# Patient Record
Sex: Female | Born: 1960
Health system: Southern US, Community
[De-identification: ages and names within clinical notes are randomized; demographics above are authoritative.]

## PROBLEM LIST (undated history)

## (undated) DIAGNOSIS — N200 Calculus of kidney: Secondary | ICD-10-CM

## (undated) DIAGNOSIS — H269 Unspecified cataract: Secondary | ICD-10-CM

## (undated) DIAGNOSIS — I4891 Unspecified atrial fibrillation: Secondary | ICD-10-CM

## (undated) DIAGNOSIS — E119 Type 2 diabetes mellitus without complications: Secondary | ICD-10-CM

## (undated) DIAGNOSIS — E785 Hyperlipidemia, unspecified: Secondary | ICD-10-CM

## (undated) DIAGNOSIS — G4730 Sleep apnea, unspecified: Secondary | ICD-10-CM

## (undated) DIAGNOSIS — Q638 Other specified congenital malformations of kidney: Secondary | ICD-10-CM

## (undated) DIAGNOSIS — R7303 Prediabetes: Secondary | ICD-10-CM

## (undated) DIAGNOSIS — Z9289 Personal history of other medical treatment: Secondary | ICD-10-CM

## (undated) DIAGNOSIS — Z8739 Personal history of other diseases of the musculoskeletal system and connective tissue: Secondary | ICD-10-CM

## (undated) DIAGNOSIS — F419 Anxiety disorder, unspecified: Secondary | ICD-10-CM

## (undated) DIAGNOSIS — K219 Gastro-esophageal reflux disease without esophagitis: Secondary | ICD-10-CM

## (undated) DIAGNOSIS — Z8619 Personal history of other infectious and parasitic diseases: Secondary | ICD-10-CM

## (undated) DIAGNOSIS — M8580 Other specified disorders of bone density and structure, unspecified site: Secondary | ICD-10-CM

## (undated) DIAGNOSIS — T7840XA Allergy, unspecified, initial encounter: Secondary | ICD-10-CM

## (undated) DIAGNOSIS — R251 Tremor, unspecified: Secondary | ICD-10-CM

## (undated) DIAGNOSIS — J45909 Unspecified asthma, uncomplicated: Secondary | ICD-10-CM

## (undated) DIAGNOSIS — R002 Palpitations: Secondary | ICD-10-CM

## (undated) DIAGNOSIS — R112 Nausea with vomiting, unspecified: Secondary | ICD-10-CM

## (undated) DIAGNOSIS — Z87442 Personal history of urinary calculi: Secondary | ICD-10-CM

## (undated) DIAGNOSIS — N809 Endometriosis, unspecified: Secondary | ICD-10-CM

## (undated) DIAGNOSIS — I499 Cardiac arrhythmia, unspecified: Secondary | ICD-10-CM

## (undated) HISTORY — DX: Anxiety disorder, unspecified: F41.9

## (undated) HISTORY — DX: Gastro-esophageal reflux disease without esophagitis: K21.9

## (undated) HISTORY — PX: NOSE SURGERY: SHX723

## (undated) HISTORY — DX: Unspecified cataract: H26.9

## (undated) HISTORY — DX: Personal history of other diseases of the musculoskeletal system and connective tissue: Z87.39

## (undated) HISTORY — DX: Hyperlipidemia, unspecified: E78.5

## (undated) HISTORY — DX: Personal history of other infectious and parasitic diseases: Z86.19

## (undated) HISTORY — DX: Endometriosis, unspecified: N80.9

## (undated) HISTORY — PX: TONSILLECTOMY: SUR1361

## (undated) HISTORY — PX: LITHOTRIPSY: SUR834

## (undated) HISTORY — PX: LAPAROSCOPIC SALPINGO OOPHERECTOMY: SHX5927

## (undated) HISTORY — DX: Unspecified atrial fibrillation: I48.91

## (undated) HISTORY — DX: Other specified congenital malformations of kidney: Q63.8

## (undated) HISTORY — PX: ABDOMINAL HYSTERECTOMY: SHX81

## (undated) HISTORY — PX: CARPAL TUNNEL RELEASE: SHX101

## (undated) HISTORY — DX: Unspecified asthma, uncomplicated: J45.909

## (undated) HISTORY — DX: Personal history of other medical treatment: Z92.89

## (undated) HISTORY — DX: Tremor, unspecified: R25.1

## (undated) HISTORY — DX: Sleep apnea, unspecified: G47.30

## (undated) HISTORY — DX: Calculus of kidney: N20.0

## (undated) HISTORY — DX: Palpitations: R00.2

## (undated) HISTORY — DX: Prediabetes: R73.03

## (undated) HISTORY — DX: Other specified disorders of bone density and structure, unspecified site: M85.80

## (undated) HISTORY — PX: OTHER SURGICAL HISTORY: SHX169

## (undated) HISTORY — DX: Allergy, unspecified, initial encounter: T78.40XA

## (undated) HISTORY — PX: ABDOMINAL SURGERY: SHX537

---

## 1994-11-10 DIAGNOSIS — N809 Endometriosis, unspecified: Secondary | ICD-10-CM

## 1994-11-10 HISTORY — DX: Endometriosis, unspecified: N80.9

## 2006-12-24 ENCOUNTER — Encounter: Admission: RE | Admit: 2006-12-24 | Discharge: 2006-12-24 | Payer: Self-pay | Admitting: Obstetrics and Gynecology

## 2009-02-14 ENCOUNTER — Encounter: Payer: Self-pay | Admitting: Cardiology

## 2009-02-14 ENCOUNTER — Ambulatory Visit: Payer: Self-pay | Admitting: Cardiology

## 2009-02-14 DIAGNOSIS — E7800 Pure hypercholesterolemia, unspecified: Secondary | ICD-10-CM | POA: Insufficient documentation

## 2009-02-14 DIAGNOSIS — R072 Precordial pain: Secondary | ICD-10-CM | POA: Insufficient documentation

## 2009-02-14 DIAGNOSIS — R002 Palpitations: Secondary | ICD-10-CM | POA: Insufficient documentation

## 2009-02-22 ENCOUNTER — Ambulatory Visit: Payer: Self-pay | Admitting: Pulmonary Disease

## 2009-02-22 DIAGNOSIS — N200 Calculus of kidney: Secondary | ICD-10-CM

## 2009-02-22 DIAGNOSIS — E785 Hyperlipidemia, unspecified: Secondary | ICD-10-CM | POA: Insufficient documentation

## 2009-02-22 DIAGNOSIS — R059 Cough, unspecified: Secondary | ICD-10-CM | POA: Insufficient documentation

## 2009-02-22 DIAGNOSIS — Q638 Other specified congenital malformations of kidney: Secondary | ICD-10-CM | POA: Insufficient documentation

## 2009-02-22 DIAGNOSIS — R0602 Shortness of breath: Secondary | ICD-10-CM | POA: Insufficient documentation

## 2009-02-22 DIAGNOSIS — R05 Cough: Secondary | ICD-10-CM

## 2009-02-22 DIAGNOSIS — K219 Gastro-esophageal reflux disease without esophagitis: Secondary | ICD-10-CM | POA: Insufficient documentation

## 2009-02-22 HISTORY — DX: Calculus of kidney: N20.0

## 2009-02-28 ENCOUNTER — Ambulatory Visit: Payer: Self-pay

## 2009-02-28 ENCOUNTER — Encounter: Payer: Self-pay | Admitting: Cardiology

## 2009-02-28 ENCOUNTER — Ambulatory Visit: Payer: Self-pay | Admitting: Cardiology

## 2009-03-21 ENCOUNTER — Ambulatory Visit: Payer: Self-pay | Admitting: Cardiology

## 2009-03-21 ENCOUNTER — Encounter: Payer: Self-pay | Admitting: Cardiology

## 2009-03-21 DIAGNOSIS — R079 Chest pain, unspecified: Secondary | ICD-10-CM | POA: Insufficient documentation

## 2009-12-19 ENCOUNTER — Ambulatory Visit: Payer: Self-pay | Admitting: Diagnostic Radiology

## 2009-12-19 ENCOUNTER — Ambulatory Visit (HOSPITAL_BASED_OUTPATIENT_CLINIC_OR_DEPARTMENT_OTHER): Admission: RE | Admit: 2009-12-19 | Discharge: 2009-12-19 | Payer: Self-pay | Admitting: Orthopedic Surgery

## 2010-04-15 ENCOUNTER — Telehealth: Payer: Self-pay | Admitting: Cardiology

## 2010-12-10 NOTE — Progress Notes (Signed)
Summary: pressure in chest  Phone Note Call from Patient Call back at Home Phone [phone] Call back at [phone]   Caller: Patient Reason for Call: Talk to Nurse Summary of Call: c/o pressure in chest b/p 88/57 taken on sat. no meds was taken. Initial call taken by: Lorne Skeens,  April 15, 2010 11:19 AM  Follow-up for Phone Call        I spoke with Meredith Finley who is not having chest discomfort at this time.  She did check her blood pressure this weekend while out at a store and did get a reading of 88/57.  She does not have a bp machine at home.She said she was tired and has had "pressure" on and off for awhile but did not go to the ED.  She also states that she has not taken the Prevacid in a "long time" b/c it was expensive.  She did say it may have helped some and would start back on the Prevacid.  She will also go to the ED if she has chest pressure again feeling like it may be her heart as well as make a follow-up appt. with Dr. Jens Som in [place]. Lisabeth Devoid RN

## 2011-06-23 ENCOUNTER — Encounter: Payer: Self-pay | Admitting: Cardiology

## 2012-09-01 ENCOUNTER — Emergency Department (HOSPITAL_BASED_OUTPATIENT_CLINIC_OR_DEPARTMENT_OTHER): Payer: 59

## 2012-09-01 ENCOUNTER — Emergency Department (HOSPITAL_BASED_OUTPATIENT_CLINIC_OR_DEPARTMENT_OTHER)
Admission: EM | Admit: 2012-09-01 | Discharge: 2012-09-02 | Disposition: A | Discharge status: Home / Self Care | Payer: 59 | Attending: Emergency Medicine | Admitting: Emergency Medicine

## 2012-09-01 ENCOUNTER — Encounter (HOSPITAL_BASED_OUTPATIENT_CLINIC_OR_DEPARTMENT_OTHER): Payer: Self-pay

## 2012-09-01 DIAGNOSIS — R002 Palpitations: Secondary | ICD-10-CM | POA: Insufficient documentation

## 2012-09-01 DIAGNOSIS — R42 Dizziness and giddiness: Secondary | ICD-10-CM | POA: Insufficient documentation

## 2012-09-01 DIAGNOSIS — R0609 Other forms of dyspnea: Secondary | ICD-10-CM | POA: Insufficient documentation

## 2012-09-01 DIAGNOSIS — Q649 Congenital malformation of urinary system, unspecified: Secondary | ICD-10-CM | POA: Insufficient documentation

## 2012-09-01 DIAGNOSIS — R109 Unspecified abdominal pain: Secondary | ICD-10-CM

## 2012-09-01 DIAGNOSIS — R0989 Other specified symptoms and signs involving the circulatory and respiratory systems: Secondary | ICD-10-CM | POA: Insufficient documentation

## 2012-09-01 DIAGNOSIS — R209 Unspecified disturbances of skin sensation: Secondary | ICD-10-CM | POA: Insufficient documentation

## 2012-09-01 DIAGNOSIS — R072 Precordial pain: Secondary | ICD-10-CM | POA: Insufficient documentation

## 2012-09-01 DIAGNOSIS — E119 Type 2 diabetes mellitus without complications: Secondary | ICD-10-CM | POA: Insufficient documentation

## 2012-09-01 DIAGNOSIS — E785 Hyperlipidemia, unspecified: Secondary | ICD-10-CM | POA: Insufficient documentation

## 2012-09-01 DIAGNOSIS — K219 Gastro-esophageal reflux disease without esophagitis: Secondary | ICD-10-CM | POA: Insufficient documentation

## 2012-09-01 DIAGNOSIS — R0789 Other chest pain: Secondary | ICD-10-CM | POA: Insufficient documentation

## 2012-09-01 DIAGNOSIS — R059 Cough, unspecified: Secondary | ICD-10-CM | POA: Insufficient documentation

## 2012-09-01 DIAGNOSIS — R05 Cough: Secondary | ICD-10-CM | POA: Insufficient documentation

## 2012-09-01 DIAGNOSIS — R197 Diarrhea, unspecified: Secondary | ICD-10-CM | POA: Insufficient documentation

## 2012-09-01 DIAGNOSIS — E7800 Pure hypercholesterolemia, unspecified: Secondary | ICD-10-CM | POA: Insufficient documentation

## 2012-09-01 DIAGNOSIS — R1032 Left lower quadrant pain: Secondary | ICD-10-CM | POA: Insufficient documentation

## 2012-09-01 DIAGNOSIS — Z79899 Other long term (current) drug therapy: Secondary | ICD-10-CM | POA: Insufficient documentation

## 2012-09-01 DIAGNOSIS — R110 Nausea: Secondary | ICD-10-CM | POA: Insufficient documentation

## 2012-09-01 DIAGNOSIS — N200 Calculus of kidney: Secondary | ICD-10-CM | POA: Insufficient documentation

## 2012-09-01 HISTORY — DX: Type 2 diabetes mellitus without complications: E11.9

## 2012-09-01 LAB — CBC WITH DIFFERENTIAL/PLATELET
Basophils Absolute: 0 10*3/uL (ref 0.0–0.1)
Basophils Relative: 0 % (ref 0–1)
Lymphs Abs: 2.1 10*3/uL (ref 0.7–4.0)
Monocytes Absolute: 0.4 10*3/uL (ref 0.1–1.0)
Neutro Abs: 11.9 10*3/uL — ABNORMAL HIGH (ref 1.7–7.7)
Neutrophils Relative %: 82 % — ABNORMAL HIGH (ref 43–77)
WBC: 14.5 10*3/uL — ABNORMAL HIGH (ref 4.0–10.5)

## 2012-09-01 LAB — COMPREHENSIVE METABOLIC PANEL
AST: 20 U/L (ref 0–37)
Albumin: 3.7 g/dL (ref 3.5–5.2)
Alkaline Phosphatase: 112 U/L (ref 39–117)
CO2: 25 mEq/L (ref 19–32)
GFR calc Af Amer: >90 mL/min (ref >90)
GFR calc non Af Amer: >90 mL/min (ref >90)
Glucose, Bld: 131 mg/dL — ABNORMAL HIGH (ref 70–99)
Total Bilirubin: 0.3 mg/dL (ref 0.3–1.2)

## 2012-09-01 LAB — URINALYSIS, ROUTINE W REFLEX MICROSCOPIC
Hgb urine dipstick: NEGATIVE
Nitrite: NEGATIVE
pH: 6.5 (ref 5.0–8.0)

## 2012-09-01 MED ORDER — ONDANSETRON HCL 4 MG/2ML IJ SOLN
4.0000 mg | Freq: Once | INTRAMUSCULAR | Status: AC
  Administered 2012-09-01: 4 mg via INTRAVENOUS
  Filled 2012-09-01: qty 2

## 2012-09-01 MED ORDER — SODIUM CHLORIDE 0.9 % IV BOLUS (SEPSIS)
1000.0000 mL | Freq: Once | INTRAVENOUS | Status: AC
  Administered 2012-09-01: 1000 mL via INTRAVENOUS

## 2012-09-01 NOTE — ED Notes (Addendum)
Pt later c/o "chest pressure" when asked to rate pain-pt refused BR offer/ need to void at present

## 2012-09-01 NOTE — ED Notes (Signed)
C/o abd pain, nausea approx 1 hour ago after eating-episode of hyperventilating after pain started-NAD at present-c/o cont'd numbness and tingling, weakness

## 2012-09-01 NOTE — ED Notes (Signed)
Patient transported to CT 

## 2012-09-01 NOTE — ED Provider Notes (Signed)
History     CSN: [id number]  Arrival date & time 09/01/12  2125   First MD Initiated Contact with Patient 09/01/12 2149      Chief Complaint  Patient presents with  . Abdominal Pain    (Consider location/radiation/quality/duration/timing/severity/associated sxs/prior treatment) HPI The patient presents with abdominal pain, nausea, diarrhea.  She notes that her symptoms began in the hours prior to arrival.  Symptoms began after eating dinner.  She notes the pain was diffuse, focally in her left lower quadrant.  The pain is sharp.  She notes associated nausea, with diarrhea immediately after the onset of pain.  After the nausea and pain began she felt diffuse discomfort, lightheadedness, dysesthesia in all extremities.  She notes that since this occurred she has gradually begun to feel better without specific interventions. She is generally healthy, though she notes over the past weeks she has had several episodes of intermittent diarrhea and abdominal pain. Past Medical History  Diagnosis Date  . Nephrolithiasis   . Palpitations   . HLD (hyperlipidemia)   . GERD (gastroesophageal reflux disease)   . Cough   . Dyspnea   . Other specified congenital anomaly of kidney   . Precordial pain   . Hypercholesterolemia     pure  . Diabetes mellitus without complication     Past Surgical History  Procedure Date  . Hysterecomt (other)   . Abdominal surgery     for congenital kidney abnormality  . Nose surgery   . Cesarean section   . Abdominal hysterectomy     No family history on file.  History  Substance Use Topics  . Smoking status: Never Smoker   . Smokeless tobacco: Not on file  . Alcohol Use: No    OB History    Grav Para Term Preterm Abortions TAB SAB Ect Mult Living                  Review of Systems  Constitutional:       Per HPI, otherwise negative  HENT:       Per HPI, otherwise negative  Eyes: Negative.   Respiratory:       Per HPI, otherwise negative    Cardiovascular:       Per HPI, otherwise negative  Gastrointestinal: Positive for nausea and diarrhea. Negative for vomiting.  Genitourinary: Negative.   Musculoskeletal:       Per HPI, otherwise negative  Skin: Negative.   Neurological: Positive for light-headedness and numbness. Negative for dizziness, tremors, syncope, facial asymmetry, speech difficulty, weakness and headaches.    Allergies  Niacin  Home Medications   Current Outpatient Rx  Name Route Sig Dispense Refill  . METFORMIN HCL PO Oral Take by mouth.    . CETIRIZINE HCL 10 MG PO TABS Oral Take 10 mg by mouth as needed.      Marland Kitchen ESTRADIOL 1 MG PO TABS Oral Take 1 mg by mouth daily.      Marland Kitchen ESTRADIOL 1 MG PO TABS Oral Take 1 mg by mouth daily.      Marland Kitchen LANSOPRAZOLE 15 MG PO CPDR Oral Take 15 mg by mouth daily.        BP 128/77  Pulse 92  Temp 97.9 F (36.6 C) (Oral)  Resp 18  Ht 5\' 3"  (1.6 m)  Wt 180 lb (81.647 kg)  BMI 31.89 kg/m2  SpO2 98%  Physical Exam  Nursing note and vitals reviewed. Constitutional: She is oriented to person, place, and time. She  appears well-developed and well-nourished. No distress.  HENT:  Head: Normocephalic and atraumatic.  Eyes: Conjunctivae normal and EOM are normal.  Cardiovascular: Normal rate and regular rhythm.   Pulmonary/Chest: Effort normal and breath sounds normal. No stridor. No respiratory distress.  Abdominal: Soft. Normal appearance. She exhibits no distension. There is tenderness in the left lower quadrant. There is no rigidity, no rebound and no guarding.  Musculoskeletal: She exhibits no edema.  Neurological: She is alert and oriented to person, place, and time. No cranial nerve deficit.  Skin: Skin is warm and dry.  Psychiatric: She has a normal mood and affect.    ED Course  Procedures (including critical care time)   Labs Reviewed  COMPREHENSIVE METABOLIC PANEL  CBC WITH DIFFERENTIAL  LIPASE, BLOOD  URINALYSIS, ROUTINE W REFLEX MICROSCOPIC   No  results found.   No diagnosis found.  12:27 AM Patient substantially better.  All results d/w her and her husband.  I showed them the CT images.  MDM  This patient presents with relatively sudden onset of nausea, diarrhea, generalized discomfort.  Given the patient's localization of intramuscular quadrant, her endorsement of recent intermittent diarrhea, or some suspicion of diverticulitis versus colitis.  On exam the patient is initially uncomfortable, though she improved substantially while in the emergency department.  The patient's labs, CAT scan were largely reassuring, with a little suspicion for acute ongoing pathology.  The patient was counseled on the need for ongoing evaluation, but absent ongoing complaints, with stable vital signs, labs are reassuring so for mild leukocytosis, and with resolution of her pain, she is discharged in stable condition.    Gerhard Munch, MD 09/03/12 [phone]

## 2012-09-01 NOTE — ED Notes (Signed)
Pt ambulatory to bathroom with assistance.

## 2012-09-01 NOTE — ED Notes (Signed)
Pt drinking po contrast for CT and instructed to use call bell when able to void. Husband to desk stating pt is allergic to IV dye. CT called and made aware. Allergy list updated.

## 2012-09-02 ENCOUNTER — Telehealth: Payer: Self-pay | Admitting: Gastroenterology

## 2012-09-02 NOTE — Telephone Encounter (Signed)
Pt reports she was seen at [facility] on 09/01/12 for LLQ pain and rectal bleeding. The bleeding is what scares her and she states it's mainly when she wipes; BRB on tissue and a pinkish mucus like discharge. She has not been seen by a GI doc in this area. Informed her I will cancel the appt with Dr Jarold Motto, she will see Gunnar Fusi tomorrow and become Dr Lauro Franklin pt. Pt given directions and stated understanding.

## 2012-09-02 NOTE — ED Notes (Signed)
MD at bedside. 

## 2012-09-03 ENCOUNTER — Ambulatory Visit: Payer: 59 | Admitting: Nurse Practitioner

## 2012-09-03 ENCOUNTER — Encounter: Payer: Self-pay | Admitting: Gastroenterology

## 2012-09-20 ENCOUNTER — Encounter: Payer: Self-pay | Admitting: *Deleted

## 2012-09-23 ENCOUNTER — Ambulatory Visit: Payer: 59 | Admitting: Gastroenterology

## 2012-09-24 ENCOUNTER — Ambulatory Visit: Payer: 59 | Admitting: Gastroenterology

## 2012-12-25 ENCOUNTER — Other Ambulatory Visit: Payer: Self-pay

## 2013-02-15 ENCOUNTER — Encounter: Payer: Self-pay | Admitting: Internal Medicine

## 2013-02-15 ENCOUNTER — Ambulatory Visit (INDEPENDENT_AMBULATORY_CARE_PROVIDER_SITE_OTHER): Payer: 59 | Admitting: Internal Medicine

## 2013-02-15 VITALS — BP 96/68 | HR 67 | Temp 97.7°F | Ht 64.0 in | Wt 182.0 lb

## 2013-02-15 DIAGNOSIS — Z1211 Encounter for screening for malignant neoplasm of colon: Secondary | ICD-10-CM

## 2013-02-15 DIAGNOSIS — K219 Gastro-esophageal reflux disease without esophagitis: Secondary | ICD-10-CM

## 2013-02-15 DIAGNOSIS — Z90710 Acquired absence of both cervix and uterus: Secondary | ICD-10-CM

## 2013-02-15 DIAGNOSIS — G4730 Sleep apnea, unspecified: Secondary | ICD-10-CM

## 2013-02-15 DIAGNOSIS — G478 Other sleep disorders: Secondary | ICD-10-CM

## 2013-02-15 DIAGNOSIS — E785 Hyperlipidemia, unspecified: Secondary | ICD-10-CM

## 2013-02-15 DIAGNOSIS — Z9289 Personal history of other medical treatment: Secondary | ICD-10-CM

## 2013-02-15 DIAGNOSIS — Z87898 Personal history of other specified conditions: Secondary | ICD-10-CM

## 2013-02-15 DIAGNOSIS — E119 Type 2 diabetes mellitus without complications: Secondary | ICD-10-CM

## 2013-02-15 DIAGNOSIS — N200 Calculus of kidney: Secondary | ICD-10-CM

## 2013-02-15 DIAGNOSIS — Z9189 Other specified personal risk factors, not elsewhere classified: Secondary | ICD-10-CM

## 2013-02-15 MED ORDER — METFORMIN HCL ER 500 MG PO TB24: 500.0000 mg | ORAL_TABLET | Freq: Every day | ORAL | Status: DC

## 2013-02-15 NOTE — Patient Instructions (Addendum)
Get back on metformin   Will do a refill. Plan labs in 3 months.    Will plan  Sleep referral and colonoscopy referral. As we discussed and I will review your records.

## 2013-02-15 NOTE — Progress Notes (Signed)
Chief Complaint  Patient presents with  . Establish Care    Unsure when she had a td injection.  Has not had a colonoscopy or flu vaccine.    HPI: Patient comes in as new patient visit . Previous care was via her specialist and no PCP . Gest dm and now early DM:  Noted mildly elevated  adn  A1c.    With the PCOS. And gestational diabetes   So on metformin for 1+ years  hasnt taken recently   Since December.  2013. Sees Dr Talmage Nap  Bp; has been good   Renal stone  Hx; of abnormality  And procedure  Ureteroscopic surgical repair for kidney defect in 1997 1997   Calcium stone  gerd controlled ls intervention  Neuro :  Tremors   And grasping problem and memory. Issues .  MRI   No acute issues  Following as needed    With Dr Jani Files up with suffocating feeling and snoring    Endometriosis PCOs compete hysterectomy on hrt   osteopenia  Husband   Has sleep apnea and concerned  She could also have this   No fam hx .  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   g2 p2  c section  ROS: See pertinent positives and negatives per HPI. Neg cp sob syncope gi bleeding 12 system review  Past Medical History  Diagnosis Date  . Nephrolithiasis   . Palpitations   . HLD (hyperlipidemia)   . GERD (gastroesophageal reflux disease)   . Cough   . Dyspnea   . Other specified congenital anomaly of kidney   . Precordial pain     cardiac evaluation  . Hypercholesterolemia     pure  . Diabetes mellitus without complication   . NEPHROLITHIASIS 02/22/2009    Qualifier: Diagnosis of  By: Excell Seltzer CMA, Lawson Fiscal    . Transfusion history     placental abruption 1986  . Hx of varicella   . H/O osteopenia   . Hx of abdominal pain     10 13 neg ct but concern about colitis other    Family History  Problem Relation Age of Onset  . Cancer Mother     Colon cancer, breast cancer and lung cancer  . Tremor Mother   .  Arthritis Father   . Vision loss Father     Macular degeneration  . Scoliosis Father   . Asthma Sister   . Asthma Brother   . Hypertension Maternal Grandmother   . Osteoporosis Maternal Grandmother   . Other Sister     twin   NAFLD    History   Social History  . Marital Status: Married    Spouse Name: N/A    Number of Children: N/A  . Years of Education: N/A   Social History Main Topics  . Smoking status: Never Smoker   . Smokeless tobacco: None  . Alcohol Use: No  . Drug Use: None  . Sexually Active: Yes    Birth Control/ Protection: Surgical   Other Topics Concern  . None   Social History Narrative   Usually gets 9 hours of sleep per night   4 people living in the home.   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 estradiol (ESTRACE) 1 MG tablet Take 1 mg by mouth daily.       . [DISCONTINUED] METFORMIN HCL PO Take by mouth.      . metFORMIN (GLUCOPHAGE-XR) 500 MG 24 hr tablet Take 1 tablet (500 mg total) by mouth daily with breakfast.  90 tablet  3  . [DISCONTINUED] cetirizine (ZYRTEC ALLERGY) 10 MG tablet Take 10 mg by mouth as needed.        . [DISCONTINUED] estradiol (ESTRACE) 1 MG tablet Take 1 mg by mouth daily.        . [DISCONTINUED] lansoprazole (PREVACID) 15 MG capsule Take 15 mg by mouth daily.         No facility-administered encounter medications on file as of 02/15/2013.    EXAM:  BP 96/68  Pulse 67  Temp(Src) 97.7 F (36.5 C) (Oral)  Ht 5\' 4"  (1.626 m)  Wt 182 lb (82.555 kg)  BMI 31.22 kg/m2  SpO2 98%  Body mass index is 31.22 kg/(m^2).  GENERAL: vitals reviewed and listed above, alert, oriented, appears well hydrated and in no acute distress  HEENT: atraumatic, conjunctiva  clear, no obvious abnormalities on inspection of external nose and ears OP : no lesion edema or exudate   NECK:  no obvious masses on inspection palpation   LUNGS: clear to auscultation bilaterally, no wheezes, rales or rhonchi, good air movement Abdomen:  Sof,t normal bowel sounds without hepatosplenomegaly, no guarding rebound or masses no CVA tenderness CV: HRRR, no clubbing cyanosis or  peripheral edema nl cap refill  MS: moves all extremities without noticeable focal  abnormality PSYCH: pleasant and cooperative, no obvious depression or anxiety  ASSESSMENT AND PLAN:  Discussed the following assessment and plan:  Diabetes mellitus without complication  Sleep disorder breathing - with snoring poss sleep apnea - Plan: Ambulatory referral to Pulmonology  G E REFLUX  Transfusion history  Hx of abdominal pain  H/O hysterectomy for benign disease - endometriosis surgical menopause  NEPHROLITHIASIS  HYPERLIPIDEMIA  Encounter for screening colonoscopy - Plan: Ambulatory referral to Gastroenterology  -Patient advised to return or notify health care team  if symptoms worsen or persist or new concerns arise.  Patient Instructions  Get back on metformin   Will do a refill. Plan labs in 3 months.    Will plan  Sleep referral and colonoscopy referral. As we discussed and I will review your records.       Neta Mends. Maddi Collar M.D. [facility]   Noted renal stones on ct scan.

## 2013-02-20 ENCOUNTER — Encounter: Payer: Self-pay | Admitting: Internal Medicine

## 2013-02-20 DIAGNOSIS — G4733 Obstructive sleep apnea (adult) (pediatric): Secondary | ICD-10-CM | POA: Insufficient documentation

## 2013-02-20 DIAGNOSIS — Z90710 Acquired absence of both cervix and uterus: Secondary | ICD-10-CM | POA: Insufficient documentation

## 2013-02-20 DIAGNOSIS — Z87898 Personal history of other specified conditions: Secondary | ICD-10-CM | POA: Insufficient documentation

## 2013-02-20 DIAGNOSIS — Z9289 Personal history of other medical treatment: Secondary | ICD-10-CM | POA: Insufficient documentation

## 2013-02-21 ENCOUNTER — Encounter: Payer: Self-pay | Admitting: Gastroenterology

## 2013-02-23 ENCOUNTER — Encounter: Payer: Self-pay | Admitting: Cardiology

## 2013-03-15 ENCOUNTER — Institutional Professional Consult (permissible substitution): Payer: 59 | Admitting: Pulmonary Disease

## 2013-03-25 ENCOUNTER — Telehealth: Payer: Self-pay | Admitting: *Deleted

## 2013-03-25 NOTE — Telephone Encounter (Signed)
Opened in error

## 2013-03-28 ENCOUNTER — Encounter: Payer: Self-pay | Admitting: Gastroenterology

## 2013-03-28 ENCOUNTER — Ambulatory Visit (AMBULATORY_SURGERY_CENTER): Payer: BC Managed Care – PPO | Admitting: *Deleted

## 2013-03-28 VITALS — Ht 63.0 in | Wt 182.2 lb

## 2013-03-28 DIAGNOSIS — Z1211 Encounter for screening for malignant neoplasm of colon: Secondary | ICD-10-CM

## 2013-03-28 DIAGNOSIS — Z800 Family history of malignant neoplasm of digestive organs: Secondary | ICD-10-CM

## 2013-03-28 MED ORDER — MOVIPREP 100 G PO SOLR: 1.0000 | Freq: Once | ORAL | Status: DC

## 2013-03-28 NOTE — Progress Notes (Signed)
Denies complications with anesthesia or sedation. Denies allergies to eggs or soy products. 

## 2013-04-11 ENCOUNTER — Encounter: Payer: Self-pay | Admitting: Gastroenterology

## 2013-04-11 ENCOUNTER — Other Ambulatory Visit: Payer: Self-pay | Admitting: Gastroenterology

## 2013-04-11 ENCOUNTER — Ambulatory Visit (AMBULATORY_SURGERY_CENTER): Payer: BC Managed Care – PPO | Admitting: Gastroenterology

## 2013-04-11 VITALS — BP 94/66 | HR 61 | Temp 97.7°F | Resp 0 | Ht 63.0 in | Wt 182.0 lb

## 2013-04-11 DIAGNOSIS — D126 Benign neoplasm of colon, unspecified: Secondary | ICD-10-CM

## 2013-04-11 DIAGNOSIS — Z1211 Encounter for screening for malignant neoplasm of colon: Secondary | ICD-10-CM

## 2013-04-11 DIAGNOSIS — Z800 Family history of malignant neoplasm of digestive organs: Secondary | ICD-10-CM

## 2013-04-11 MED ORDER — DEXTROSE 5 % IV SOLN: INTRAVENOUS | Status: DC

## 2013-04-11 MED ORDER — SODIUM CHLORIDE 0.9 % IV SOLN: 500.0000 mL | INTRAVENOUS | Status: DC

## 2013-04-11 NOTE — Patient Instructions (Addendum)
Discharge instructions given with verbal understanding. Handout on polyps given. Resume previous medications. YOU HAD AN ENDOSCOPIC PROCEDURE TODAY AT THE [facility]: Refer to the procedure report that was given to you for any specific questions about what was found during the examination.  If the procedure report does not answer your questions, please call your gastroenterologist to clarify.  If you requested that your care partner not be given the details of your procedure findings, then the procedure report has been included in a sealed envelope for you to review at your convenience later.  YOU SHOULD EXPECT: Some feelings of bloating in the abdomen. Passage of more gas than usual.  Walking can help get rid of the air that was put into your GI tract during the procedure and reduce the bloating. If you had a lower endoscopy (such as a colonoscopy or flexible sigmoidoscopy) you may notice spotting of blood in your stool or on the toilet paper. If you underwent a bowel prep for your procedure, then you may not have a normal bowel movement for a few days.  DIET: Your first meal following the procedure should be a light meal and then it is ok to progress to your normal diet.  A half-sandwich or bowl of soup is an example of a good first meal.  Heavy or fried foods are harder to digest and may make you feel nauseous or bloated.  Likewise meals heavy in dairy and vegetables can cause extra gas to form and this can also increase the bloating.  Drink plenty of fluids but you should avoid alcoholic beverages for 24 hours.  ACTIVITY: Your care partner should take you home directly after the procedure.  You should plan to take it easy, moving slowly for the rest of the day.  You can resume normal activity the day after the procedure however you should NOT DRIVE or use heavy machinery for 24 hours (because of the sedation medicines used during the test).    SYMPTOMS TO REPORT IMMEDIATELY: A  gastroenterologist can be reached at any hour.  During normal business hours, 8:30 AM to 5:00 PM Monday through Friday, call [phone].  After hours and on weekends, please call the GI answering service at [phone] who will take a message and have the physician on call contact you.   Following lower endoscopy (colonoscopy or flexible sigmoidoscopy):  Excessive amounts of blood in the stool  Significant tenderness or worsening of abdominal pains  Swelling of the abdomen that is new, acute  Fever of 100F or higher  FOLLOW UP: If any biopsies were taken you will be contacted by phone or by letter within the next 1-3 weeks.  Call your gastroenterologist if you have not heard about the biopsies in 3 weeks.  Our staff will call the home number listed on your records the next business day following your procedure to check on you and address any questions or concerns that you may have at that time regarding the information given to you following your procedure. This is a courtesy call and so if there is no answer at the home number and we have not heard from you through the emergency physician on call, we will assume that you have returned to your regular daily activities without incident.  SIGNATURES/CONFIDENTIALITY: You and/or your care partner have signed paperwork which will be entered into your electronic medical record.  These signatures attest to the fact that that the information above on your After Visit Summary has   been reviewed and is understood.  Full responsibility of the confidentiality of this discharge information lies with you and/or your care-partner. 

## 2013-04-11 NOTE — Progress Notes (Signed)
Patient did not experience any of the following events: a burn prior to discharge; a fall within the facility; wrong site/side/patient/procedure/implant event; or a hospital transfer or hospital admission upon discharge from the facility. (G8907) Patient did not have preoperative order for IV antibiotic SSI prophylaxis. (G8918)  

## 2013-04-11 NOTE — Op Note (Signed)
[facility] [address] [place], [address]   COLONOSCOPY PROCEDURE REPORT  PATIENT: Meredith, Finley  MR#: [id number] BIRTHDATE: 03-04-1961 , 52  yrs. old GENDER: Female ENDOSCOPIST: Mardella Layman, MD, Clementeen Graham REFERRED BY:  Madelin Headings, M.D. PROCEDURE DATE:  04/11/2013 PROCEDURE:   Colonoscopy, screening and Colonoscopy with biopsy ASA CLASS:   Class II INDICATIONS:Patient's immediate family history of colon cancer and Rectal Bleeding. MEDICATIONS: propofol (Diprivan) 300mg  IV  DESCRIPTION OF PROCEDURE:   After the risks and benefits and of the procedure were explained, informed consent was obtained.  A digital rectal exam revealed no abnormalities of the rectum.    The LB [id number]  endoscope was introduced through the anus and advanced to the cecum, which was identified by both the appendix and ileocecal valve .  The quality of the prep was excellent, using MoviPrep .  The instrument was then slowly withdrawn as the colon was fully examined.     COLON FINDINGS: A smooth flat polyp ranging between 3-35mm in size was found in the sigmoid colon.  Multiple biopsies of the area were performed using cold forceps.   The colon was otherwise normal. There was no diverticulosis, inflammation, polyps or cancers unless previously stated.     Retroflexed views revealed no abnormalities. The scope was then withdrawn from the patient and the procedure completed.  COMPLICATIONS: There were no complications. ENDOSCOPIC IMPRESSION: 1.   Flat polyp ranging between 3-71mm in size was found in the sigmoid colon; multiple biopsies of the area were performed using cold forceps 2.   The colon was otherwise normal ..FH colon Ca in her mother 3.  Hx of probable ischemic colitis previouslt in the fall  RECOMMENDATIONS: 1.  Await pathology results 2.  Given your significant family history of colon cancer, you should have a repeat colonoscopy in 5 years   REPEAT  EXAM:  cc:  _______________________________ eSigned:  Mardella Layman, MD, [facility] 04/11/2013 9:57 AM     PATIENT NAME:  Meredith, Finley MR#: [id number]

## 2013-04-11 NOTE — Progress Notes (Signed)
Called to room to assist during endoscopic procedure.  Patient ID and intended procedure confirmed with present staff. Received instructions for my participation in the procedure from the performing physician.  

## 2013-04-12 ENCOUNTER — Telehealth: Payer: Self-pay

## 2013-04-12 NOTE — Telephone Encounter (Signed)
  Follow up Call-  Call back number 04/11/2013  Post procedure Call Back phone  # [phone]  Permission to leave phone message Yes     Patient questions:  Do you have a fever, pain , or abdominal swelling? no Pain Score  0 *  Have you tolerated food without any problems? yes  Have you been able to return to your normal activities? yes  Do you have any questions about your discharge instructions: Diet   no Medications  no Follow up visit  no  Do you have questions or concerns about your Care? no  Actions: * If pain score is 4 or above: No action needed, pain <4.

## 2013-04-18 ENCOUNTER — Encounter: Payer: Self-pay | Admitting: Gastroenterology

## 2013-05-03 ENCOUNTER — Institutional Professional Consult (permissible substitution): Payer: 59 | Admitting: Internal Medicine

## 2013-05-17 ENCOUNTER — Other Ambulatory Visit (INDEPENDENT_AMBULATORY_CARE_PROVIDER_SITE_OTHER): Payer: BC Managed Care – PPO

## 2013-05-17 DIAGNOSIS — Z0000 Encounter for general adult medical examination without abnormal findings: Secondary | ICD-10-CM

## 2013-05-17 LAB — BASIC METABOLIC PANEL
Chloride: 104 mEq/L (ref 96–112)
Potassium: 3.7 mEq/L (ref 3.5–5.1)
Sodium: 139 mEq/L (ref 135–145)

## 2013-05-17 LAB — CBC WITH DIFFERENTIAL/PLATELET
Basophils Relative: 0.6 % (ref 0.0–3.0)
Eosinophils Relative: 2.4 % (ref 0.0–5.0)
HCT: 40.7 % (ref 36.0–46.0)
Lymphs Abs: 2.8 10*3/uL (ref 0.7–4.0)
MCV: 93.5 fl (ref 78.0–100.0)
Monocytes Absolute: 0.4 10*3/uL (ref 0.1–1.0)
Monocytes Relative: 5.5 % (ref 3.0–12.0)
RBC: 4.35 Mil/uL (ref 3.87–5.11)
WBC: 7.6 10*3/uL (ref 4.5–10.5)

## 2013-05-17 LAB — HEMOGLOBIN A1C: Hgb A1c MFr Bld: 5.7 % (ref 4.6–6.5)

## 2013-05-17 LAB — HEPATIC FUNCTION PANEL
ALT: 18 U/L (ref 0–35)
AST: 21 U/L (ref 0–37)
Bilirubin, Direct: 0.1 mg/dL (ref 0.0–0.3)
Total Protein: 7.6 g/dL (ref 6.0–8.3)

## 2013-05-17 LAB — TSH: TSH: 1.31 u[IU]/mL (ref 0.35–5.50)

## 2013-05-17 LAB — LIPID PANEL: Cholesterol: 224 mg/dL — ABNORMAL HIGH (ref 0–200)

## 2013-05-18 ENCOUNTER — Other Ambulatory Visit: Payer: Self-pay | Admitting: Obstetrics and Gynecology

## 2013-05-18 LAB — HEPATITIS C ANTIBODY: HCV Ab: NEGATIVE

## 2013-05-24 ENCOUNTER — Encounter: Payer: Self-pay | Admitting: Internal Medicine

## 2013-05-24 ENCOUNTER — Ambulatory Visit (INDEPENDENT_AMBULATORY_CARE_PROVIDER_SITE_OTHER): Payer: BC Managed Care – PPO | Admitting: Internal Medicine

## 2013-05-24 VITALS — BP 112/74 | HR 86 | Temp 97.5°F | Ht 63.75 in | Wt 184.0 lb

## 2013-05-24 DIAGNOSIS — E785 Hyperlipidemia, unspecified: Secondary | ICD-10-CM

## 2013-05-24 DIAGNOSIS — M549 Dorsalgia, unspecified: Secondary | ICD-10-CM

## 2013-05-24 DIAGNOSIS — N200 Calculus of kidney: Secondary | ICD-10-CM

## 2013-05-24 DIAGNOSIS — Z0000 Encounter for general adult medical examination without abnormal findings: Secondary | ICD-10-CM

## 2013-05-24 DIAGNOSIS — Q638 Other specified congenital malformations of kidney: Secondary | ICD-10-CM

## 2013-05-24 DIAGNOSIS — E8941 Symptomatic postprocedural ovarian failure: Secondary | ICD-10-CM

## 2013-05-24 DIAGNOSIS — E8940 Asymptomatic postprocedural ovarian failure: Secondary | ICD-10-CM

## 2013-05-24 DIAGNOSIS — K219 Gastro-esophageal reflux disease without esophagitis: Secondary | ICD-10-CM

## 2013-05-24 DIAGNOSIS — Z90710 Acquired absence of both cervix and uterus: Secondary | ICD-10-CM

## 2013-05-24 DIAGNOSIS — R7309 Other abnormal glucose: Secondary | ICD-10-CM

## 2013-05-24 DIAGNOSIS — R7303 Prediabetes: Secondary | ICD-10-CM

## 2013-05-24 MED ORDER — METFORMIN HCL ER 500 MG PO TB24: 500.0000 mg | ORAL_TABLET | Freq: Every day | ORAL | Status: DC

## 2013-05-24 NOTE — Patient Instructions (Signed)
Continue lifestyle intervention healthy eating and exercise . NO CHANGE IN meds. Can get tdap booster at any time.  I agree that there is no evidence of diabetes and will correct the medical record.  Have copy of  dexa sent to [place] . Will do a urology referral for the stones and pain.   We can repeat  Lipid hg a1c in 6 months   Otherwise  Yearly cpx with labs and a1c.   Hypertriglyceridemia  Diet for High blood levels of Triglycerides Most fats in food are triglycerides. Triglycerides in your blood are stored as fat in your body. High levels of triglycerides in your blood may put you at a greater risk for heart disease and stroke.  Normal triglyceride levels are less than 150 mg/dL. Borderline high levels are 150-199 mg/dl. High levels are 200 - 499 mg/dL, and very high triglyceride levels are greater than 500 mg/dL. The decision to treat high triglycerides is generally based on the level. For people with borderline or high triglyceride levels, treatment includes weight loss and exercise. Drugs are recommended for people with very high triglyceride levels. Many people who need treatment for high triglyceride levels have metabolic syndrome. This syndrome is a collection of disorders that often include: insulin resistance, high blood pressure, blood clotting problems, high cholesterol and triglycerides. TESTING PROCEDURE FOR TRIGLYCERIDES  You should not eat 4 hours before getting your triglycerides measured. The normal range of triglycerides is between 10 and 250 milligrams per deciliter (mg/dl). Some people may have extreme levels (1000 or above), but your triglyceride level may be too high if it is above 150 mg/dl, depending on what other risk factors you have for heart disease.  People with high blood triglycerides may also have high blood cholesterol levels. If you have high blood cholesterol as well as high blood triglycerides, your risk for heart disease is probably greater than if you only  had high triglycerides. High blood cholesterol is one of the main risk factors for heart disease. CHANGING YOUR DIET  Your weight can affect your blood triglyceride level. If you are more than 20% above your ideal body weight, you may be able to lower your blood triglycerides by losing weight. Eating less and exercising regularly is the best way to combat this. Fat provides more calories than any other food. The best way to lose weight is to eat less fat. Only 30% of your total calories should come from fat. Less than 7% of your diet should come from saturated fat. A diet low in fat and saturated fat is the same as a diet to decrease blood cholesterol. By eating a diet lower in fat, you may lose weight, lower your blood cholesterol, and lower your blood triglyceride level.  Eating a diet low in fat, especially saturated fat, may also help you lower your blood triglyceride level. Ask your dietitian to help you figure how much fat you can eat based on the number of calories your caregiver has prescribed for you.  Exercise, in addition to helping with weight loss may also help lower triglyceride levels.   Alcohol can increase blood triglycerides. You may need to stop drinking alcoholic beverages.  Too much carbohydrate in your diet may also increase your blood triglycerides. Some complex carbohydrates are necessary in your diet. These may include bread, rice, potatoes, other starchy vegetables and cereals.  Reduce "simple" carbohydrates. These may include pure sugars, candy, honey, and jelly without losing other nutrients. If you have the kind of high  blood triglycerides that is affected by the amount of carbohydrates in your diet, you will need to eat less sugar and less high-sugar foods. Your caregiver can help you with this.  Adding 2-4 grams of fish oil (EPA+ DHA) may also help lower triglycerides. Speak with your caregiver before adding any supplements to your regimen. Following the Diet  Maintain  your ideal weight. Your caregivers can help you with a diet. Generally, eating less food and getting more exercise will help you lose weight. Joining a weight control group may also help. Ask your caregivers for a good weight control group in your area.  Eat low-fat foods instead of high-fat foods. This can help you lose weight too.  These foods are lower in fat. Eat MORE of these:   Dried beans, peas, and lentils.  Egg whites.  Low-fat cottage cheese.  Fish.  Lean cuts of meat, such as round, sirloin, rump, and flank (cut extra fat off meat you fix).  Whole grain breads, cereals and pasta.  Skim and nonfat dry milk.  Low-fat yogurt.  Poultry without the skin.  Cheese made with skim or part-skim milk, such as mozzarella, parmesan, farmers', ricotta, or pot cheese. These are higher fat foods. Eat LESS of these:   Whole milk and foods made from whole milk, such as American, blue, cheddar, monterey jack, and swiss cheese  High-fat meats, such as luncheon meats, sausages, knockwurst, bratwurst, hot dogs, ribs, corned beef, ground pork, and regular ground beef.  Fried foods. Limit saturated fats in your diet. Substituting unsaturated fat for saturated fat may decrease your blood triglyceride level. You will need to read package labels to know which products contain saturated fats.  These foods are high in saturated fat. Eat LESS of these:   Fried pork skins.  Whole milk.  Skin and fat from poultry.  Palm oil.  Butter.  Shortening.  Cream cheese.  Tomasa Blase.  Margarines and baked goods made from listed oils.  Vegetable shortenings.  Chitterlings.  Fat from meats.  Coconut oil.  Palm kernel oil.  Lard.  Cream.  Sour cream.  Fatback.  Coffee whiteners and non-dairy creamers made with these oils.  Cheese made from whole milk. Use unsaturated fats (both polyunsaturated and monounsaturated) moderately. Remember, even though unsaturated fats are better than  saturated fats; you still want a diet low in total fat.  These foods are high in unsaturated fat:   Canola oil.  Sunflower oil.  Mayonnaise.  Almonds.  Peanuts.  Pine nuts.  Margarines made with these oils.  Safflower oil.  Olive oil.  Avocados.  Cashews.  Peanut butter.  Sunflower seeds.  Soybean oil.  Peanut oil.  Olives.  Pecans.  Walnuts.  Pumpkin seeds. Avoid sugar and other high-sugar foods. This will decrease carbohydrates without decreasing other nutrients. Sugar in your food goes rapidly to your blood. When there is excess sugar in your blood, your liver may use it to make more triglycerides. Sugar also contains calories without other important nutrients.  Eat LESS of these:   Sugar, brown sugar, powdered sugar, jam, jelly, preserves, honey, syrup, molasses, pies, candy, cakes, cookies, frosting, pastries, colas, soft drinks, punches, fruit drinks, and regular gelatin.  Avoid alcohol. Alcohol, even more than sugar, may increase blood triglycerides. In addition, alcohol is high in calories and low in nutrients. Ask for sparkling water, or a diet soft drink instead of an alcoholic beverage. Suggestions for planning and preparing meals   Bake, broil, grill or roast meats instead  of frying.  Remove fat from meats and skin from poultry before cooking.  Add spices, herbs, lemon juice or vinegar to vegetables instead of salt, rich sauces or gravies.  Use a non-stick skillet without fat or use no-stick sprays.  Cool and refrigerate stews and broth. Then remove the hardened fat floating on the surface before serving.  Refrigerate meat drippings and skim off fat to make low-fat gravies.  Serve more fish.  Use less butter, margarine and other high-fat spreads on bread or vegetables.  Use skim or reconstituted non-fat dry milk for cooking.  Cook with low-fat cheeses.  Substitute low-fat yogurt or cottage cheese for all or part of the sour cream in  recipes for sauces, dips or congealed salads.  Use half yogurt/half mayonnaise in salad recipes.  Substitute evaporated skim milk for cream. Evaporated skim milk or reconstituted non-fat dry milk can be whipped and substituted for whipped cream in certain recipes.  Choose fresh fruits for dessert instead of high-fat foods such as pies or cakes. Fruits are naturally low in fat. When Dining Out   Order low-fat appetizers such as fruit or vegetable juice, pasta with vegetables or tomato sauce.  Select clear, rather than cream soups.  Ask that dressings and gravies be served on the side. Then use less of them.  Order foods that are baked, broiled, poached, steamed, stir-fried, or roasted.  Ask for margarine instead of butter, and use only a small amount.  Drink sparkling water, unsweetened tea or coffee, or diet soft drinks instead of alcohol or other sweet beverages. QUESTIONS AND ANSWERS ABOUT OTHER FATS IN THE BLOOD: SATURATED FAT, TRANS FAT, AND CHOLESTEROL What is trans fat? Trans fat is a type of fat that is formed when vegetable oil is hardened through a process called hydrogenation. This process helps makes foods more solid, gives them shape, and prolongs their shelf life. Trans fats are also called hydrogenated or partially hydrogenated oils.  What do saturated fat, trans fat, and cholesterol in foods have to do with heart disease? Saturated fat, trans fat, and cholesterol in the diet all raise the level of LDL "bad" cholesterol in the blood. The higher the LDL cholesterol, the greater the risk for coronary heart disease (CHD). Saturated fat and trans fat raise LDL similarly.  What foods contain saturated fat, trans fat, and cholesterol? High amounts of saturated fat are found in animal products, such as fatty cuts of meat, chicken skin, and full-fat dairy products like butter, whole milk, cream, and cheese, and in tropical vegetable oils such as palm, palm kernel, and coconut oil.  Trans fat is found in some of the same foods as saturated fat, such as vegetable shortening, some margarines (especially hard or stick margarine), crackers, cookies, baked goods, fried foods, salad dressings, and other processed foods made with partially hydrogenated vegetable oils. Small amounts of trans fat also occur naturally in some animal products, such as milk products, beef, and lamb. Foods high in cholesterol include liver, other organ meats, egg yolks, shrimp, and full-fat dairy products. How can I use the new food label to make heart-healthy food choices? Check the Nutrition Facts panel of the food label. Choose foods lower in saturated fat, trans fat, and cholesterol. For saturated fat and cholesterol, you can also use the Percent Daily Value (%DV): 5% DV or less is low, and 20% DV or more is high. (There is no %DV for trans fat.) Use the Nutrition Facts panel to choose foods low in saturated fat  and cholesterol, and if the trans fat is not listed, read the ingredients and limit products that list shortening or hydrogenated or partially hydrogenated vegetable oil, which tend to be high in trans fat. POINTS TO REMEMBER:   Discuss your risk for heart disease with your caregivers, and take steps to reduce risk factors.  Change your diet. Choose foods that are low in saturated fat, trans fat, and cholesterol.  Add exercise to your daily routine if it is not already being done. Participate in physical activity of moderate intensity, like brisk walking, for at least 30 minutes on most, and preferably all days of the week. No time? Break the 30 minutes into three, 10-minute segments during the day.  Stop smoking. If you do smoke, contact your caregiver to discuss ways in which they can help you quit.  Do not use street drugs.  Maintain a normal weight.  Maintain a healthy blood pressure.  Keep up with your blood work for checking the fats in your blood as directed by your caregiver. Document  Released: 08/14/2004 Document Revised: 04/27/2012 Document Reviewed: 03/12/2009 [facility] Patient Information 2014 [place].

## 2013-05-24 NOTE — Progress Notes (Signed)
Chief Complaint  Patient presents with  . Annual Exam    HPI: Patient comes in today for Preventive Health Care visit  Mother in law in charege of kitchen. Baking.  Not eating as well... No exercise .    Avoiding  Sodas    For GI   Se.   Lower Gi was ok .  Having kidney  Pains  Had stones right in the past no hematuria or dysuria. ? Referral to urologist .   No local establish here. Here.   Hormone replacement therapy is weaning down the dosage because of age doing okay so far. ROS:  GEN/ HEENT: No fever, significant weight changes sweats headaches vision problems hearing changes, CV/ PULM; No chest pain shortness of breath cough, syncope,edema  change in exercise tolerance. GI /GU: No adominal pain, vomiting, change in bowel habits. No blood in the stool. No significant GU symptoms. Except as above SKIN/HEME: ,no acute skin rashes suspicious lesions or bleeding. No lymphadenopathy, nodules, masses.  NEURO/ PSYCH:  No neurologic signs such as weakness numbness. No depression anxiety. IMM/ Allergy: No unusual infections.  Allergy .   REST of 12 system review negative except as per HPI Past Surgical History  Procedure Laterality Date  . Abdominal surgery      for congenital kidney abnormality  . Nose surgery    . Cesarean section  [id number]  . Abdominal hysterectomy    . Urecteroscopic    . Lithotripsy  1992-1996    multiple  . Total abdominal hysterectomy w/ bilateral salpingoophorectomy    . Oopherctomy 94 and 96       Past Medical History  Diagnosis Date  . Nephrolithiasis   . Palpitations   . HLD (hyperlipidemia)   . GERD (gastroesophageal reflux disease)   . Cough   . Dyspnea   . Other specified congenital anomaly of kidney   . Precordial pain     cardiac evaluation  . Hypercholesterolemia     pure  . Pre-diabetes   . NEPHROLITHIASIS 02/22/2009    Qualifier: Diagnosis of  By: Excell Seltzer CMA, Lawson Fiscal    . Transfusion history     placental abruption 1986  . Hx of  varicella   . H/O osteopenia   . Hx of abdominal pain     10 13 neg ct but concern about colitis other  . Endometriosis 1996    Family History  Problem Relation Age of Onset  . Cancer Mother     Colon cancer, breast cancer and lung cancer  . Tremor Mother   . Colon cancer Mother   . Arthritis Father   . Vision loss Father     Macular degeneration  . Scoliosis Father   . Asthma Sister   . Asthma Brother   . Hypertension Maternal Grandmother   . Osteoporosis Maternal Grandmother   . Other Sister     twin   NAFLD  . Esophageal cancer Neg Hx   . Rectal cancer Neg Hx   . Stomach cancer Neg Hx    daughter developed gout on Topamax. Has lactose intolerance.  History   Social History  . Marital Status: Married    Spouse Name: N/A    Number of Children: N/A  . Years of Education: N/A   Social History Main Topics  . Smoking status: Never Smoker   . Smokeless tobacco: Never Used  . Alcohol Use: No  . Drug Use: No  . Sexually Active: Yes    Birth Control/ Protection:  Surgical   Other Topics Concern  . None   Social History Narrative   Usually gets 9 hours of sleep per night   4 people living in the home.   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 estradiol (ESTRACE) 0.5 MG tablet Take 0.5 mg by mouth daily.      . metFORMIN (GLUCOPHAGE-XR) 500 MG 24 hr tablet Take 1 tablet (500 mg total) by mouth daily with breakfast.  90 tablet  3  . [DISCONTINUED] metFORMIN (GLUCOPHAGE-XR) 500 MG 24 hr tablet Take 1 tablet (500 mg total) by mouth daily with breakfast.  90 tablet  3  . [DISCONTINUED] estradiol (ESTRACE) 1 MG tablet Take 1 mg by mouth daily.        No facility-administered encounter medications on file as of 05/24/2013.    EXAM:  BP 112/74  Pulse 86  Temp(Src) 97.5 F (36.4 C) (Oral)  Ht 5' 3.75" (1.619 m)   Wt 184 lb (83.462 kg)  BMI 31.84 kg/m2  SpO2 98%  Body mass index is 31.84 kg/(m^2).  Physical Exam: Vital signs reviewed [id number] is a well-developed well-nourished alert cooperative   female who appears her stated age in no acute distress.  HEENT: normocephalic atraumatic , Eyes: PERRL EOM's full, conjunctiva clear, Nares: paten,t no deformity discharge or tenderness., Ears: no deformity EAC's clear TMs with normal landmarks. Mouth: clear OP, no lesions, edema.  Moist mucous membranes. Dentition in adequate repair. NECK: supple without masses, thyromegaly or bruits. CHEST/PULM:  Clear to auscultation and percussion breath sounds equal no wheeze , rales or rhonchi. No chest wall deformities or tenderness. CV: PMI is nondisplaced, S1 S2 no gallops, murmurs, rubs. Peripheral pulses are full without delay.No JVD .  ABDOMEN: Bowel sounds normal nontender  No guard or rebound, no hepato splenomegal no CVA tenderness.  No hernia. Extremtities:  No clubbing cyanosis or edema, no acute joint swelling or redness no focal atrophy NEURO:  Oriented x3, cranial nerves 3-12 appear to be intact, no obvious focal weakness,gait within normal limits no abnormal reflexes or asymmetrical SKIN: No acute rashes normal turgor, color, no bruising or petechiae. PSYCH: Oriented, good eye contact, no obvious depression anxiety, cognition and judgment appear normal. LN: no cervical axillary inguinal adenopathy  Lab Results  Component Value Date   WBC 7.6 05/17/2013   HGB 13.6 05/17/2013   HCT 40.7 05/17/2013   PLT 270.0 05/17/2013   GLUCOSE 96 05/17/2013   CHOL 224* 05/17/2013   TRIG 362.0* 05/17/2013   HDL 41.40 05/17/2013   LDLDIRECT 136.3 05/17/2013   ALT 18 05/17/2013   AST 21 05/17/2013   NA 139 05/17/2013   K 3.7 05/17/2013   CL 104 05/17/2013   CREATININE 0.6 05/17/2013   BUN 11 05/17/2013   CO2 31 05/17/2013   TSH 1.31 05/17/2013   HGBA1C 5.7 05/17/2013    ASSESSMENT AND PLAN:  Discussed the following assessment and  plan:  Visit for preventive health examination  Pre-diabetes  HYPERLIPIDEMIA  G E REFLUX  NEPHROLITHIASIS - Plan: Ambulatory referral to Urology  H/O hysterectomy for benign disease  OTHER SPECIFIED CONGENITAL ANOMALIES OF KIDNEY - Plan: Ambulatory referral to Urology  Surgical menopause  Back pain - Plan: Ambulatory referral to Urology Discussed dietary lifestyle intervention for  her elevated triglycerides difficult situation at home. Patient made aware. Patient has not been diabetic but her prediabetic situation. Medical record corrected. Continue lifestyle intervention and metformin. Healthcare maintenance she is a mammogram and bone density scheduled would like to delay tdap  for now until the next time.  We'll refer to urology in regard to her persistent renal stones and now some right flank pain that could be musculoskeletal versus urologic. In no acute distress today. Patient Care Team: Madelin Headings, MD as PCP - General (Internal Medicine) Jeani Hawking, MD as Attending Physician (Obstetrics and Gynecology) Patient Instructions  Continue lifestyle intervention healthy eating and exercise . NO CHANGE IN meds. Can get tdap booster at any time.  I agree that there is no evidence of diabetes and will correct the medical record.  Have copy of  dexa sent to [place] . Will do a urology referral for the stones and pain.   We can repeat  Lipid hg a1c in 6 months   Otherwise  Yearly cpx with labs and a1c.   Hypertriglyceridemia  Diet for High blood levels of Triglycerides Most fats in food are triglycerides. Triglycerides in your blood are stored as fat in your body. High levels of triglycerides in your blood may put you at a greater risk for heart disease and stroke.  Normal triglyceride levels are less than 150 mg/dL. Borderline high levels are 150-199 mg/dl. High levels are 200 - 499 mg/dL, and very high triglyceride levels are greater than 500 mg/dL. The decision to treat  high triglycerides is generally based on the level. For people with borderline or high triglyceride levels, treatment includes weight loss and exercise. Drugs are recommended for people with very high triglyceride levels. Many people who need treatment for high triglyceride levels have metabolic syndrome. This syndrome is a collection of disorders that often include: insulin resistance, high blood pressure, blood clotting problems, high cholesterol and triglycerides. TESTING PROCEDURE FOR TRIGLYCERIDES  You should not eat 4 hours before getting your triglycerides measured. The normal range of triglycerides is between 10 and 250 milligrams per deciliter (mg/dl). Some people may have extreme levels (1000 or above), but your triglyceride level may be too high if it is above 150 mg/dl, depending on what other risk factors you have for heart disease.  People with high blood triglycerides may also have high blood cholesterol levels. If you have high blood cholesterol as well as high blood triglycerides, your risk for heart disease is probably greater than if you only had high triglycerides. High blood cholesterol is one of the main risk factors for heart disease. CHANGING YOUR DIET  Your weight can affect your blood triglyceride level. If you are more than 20% above your ideal body weight, you may be able to lower your blood triglycerides by losing weight. Eating less and exercising regularly is the best way to combat this. Fat provides more calories than any other food. The best way to lose weight is to eat less fat. Only 30% of your total calories should come from fat. Less than 7% of your diet should come from saturated fat. A diet low in fat and saturated fat is the same as a diet to decrease blood cholesterol. By eating a diet lower in fat, you may lose weight, lower your blood cholesterol, and lower your blood triglyceride level.  Eating a diet low in fat, especially saturated fat, may also help you lower  your blood triglyceride level. Ask your dietitian to help you  figure how much fat you can eat based on the number of calories your caregiver has prescribed for you.  Exercise, in addition to helping with weight loss may also help lower triglyceride levels.   Alcohol can increase blood triglycerides. You may need to stop drinking alcoholic beverages.  Too much carbohydrate in your diet may also increase your blood triglycerides. Some complex carbohydrates are necessary in your diet. These may include bread, rice, potatoes, other starchy vegetables and cereals.  Reduce "simple" carbohydrates. These may include pure sugars, candy, honey, and jelly without losing other nutrients. If you have the kind of high blood triglycerides that is affected by the amount of carbohydrates in your diet, you will need to eat less sugar and less high-sugar foods. Your caregiver can help you with this.  Adding 2-4 grams of fish oil (EPA+ DHA) may also help lower triglycerides. Speak with your caregiver before adding any supplements to your regimen. Following the Diet  Maintain your ideal weight. Your caregivers can help you with a diet. Generally, eating less food and getting more exercise will help you lose weight. Joining a weight control group may also help. Ask your caregivers for a good weight control group in your area.  Eat low-fat foods instead of high-fat foods. This can help you lose weight too.  These foods are lower in fat. Eat MORE of these:   Dried beans, peas, and lentils.  Egg whites.  Low-fat cottage cheese.  Fish.  Lean cuts of meat, such as round, sirloin, rump, and flank (cut extra fat off meat you fix).  Whole grain breads, cereals and pasta.  Skim and nonfat dry milk.  Low-fat yogurt.  Poultry without the skin.  Cheese made with skim or part-skim milk, such as mozzarella, parmesan, farmers', ricotta, or pot cheese. These are higher fat foods. Eat LESS of these:   Whole milk and  foods made from whole milk, such as American, blue, cheddar, monterey jack, and swiss cheese  High-fat meats, such as luncheon meats, sausages, knockwurst, bratwurst, hot dogs, ribs, corned beef, ground pork, and regular ground beef.  Fried foods. Limit saturated fats in your diet. Substituting unsaturated fat for saturated fat may decrease your blood triglyceride level. You will need to read package labels to know which products contain saturated fats.  These foods are high in saturated fat. Eat LESS of these:   Fried pork skins.  Whole milk.  Skin and fat from poultry.  Palm oil.  Butter.  Shortening.  Cream cheese.  Tomasa Blase.  Margarines and baked goods made from listed oils.  Vegetable shortenings.  Chitterlings.  Fat from meats.  Coconut oil.  Palm kernel oil.  Lard.  Cream.  Sour cream.  Fatback.  Coffee whiteners and non-dairy creamers made with these oils.  Cheese made from whole milk. Use unsaturated fats (both polyunsaturated and monounsaturated) moderately. Remember, even though unsaturated fats are better than saturated fats; you still want a diet low in total fat.  These foods are high in unsaturated fat:   Canola oil.  Sunflower oil.  Mayonnaise.  Almonds.  Peanuts.  Pine nuts.  Margarines made with these oils.  Safflower oil.  Olive oil.  Avocados.  Cashews.  Peanut butter.  Sunflower seeds.  Soybean oil.  Peanut oil.  Olives.  Pecans.  Walnuts.  Pumpkin seeds. Avoid sugar and other high-sugar foods. This will decrease carbohydrates without decreasing other nutrients. Sugar in your food goes rapidly to your blood. When there is excess sugar in  your blood, your liver may use it to make more triglycerides. Sugar also contains calories without other important nutrients.  Eat LESS of these:   Sugar, brown sugar, powdered sugar, jam, jelly, preserves, honey, syrup, molasses, pies, candy, cakes, cookies, frosting,  pastries, colas, soft drinks, punches, fruit drinks, and regular gelatin.  Avoid alcohol. Alcohol, even more than sugar, may increase blood triglycerides. In addition, alcohol is high in calories and low in nutrients. Ask for sparkling water, or a diet soft drink instead of an alcoholic beverage. Suggestions for planning and preparing meals   Bake, broil, grill or roast meats instead of frying.  Remove fat from meats and skin from poultry before cooking.  Add spices, herbs, lemon juice or vinegar to vegetables instead of salt, rich sauces or gravies.  Use a non-stick skillet without fat or use no-stick sprays.  Cool and refrigerate stews and broth. Then remove the hardened fat floating on the surface before serving.  Refrigerate meat drippings and skim off fat to make low-fat gravies.  Serve more fish.  Use less butter, margarine and other high-fat spreads on bread or vegetables.  Use skim or reconstituted non-fat dry milk for cooking.  Cook with low-fat cheeses.  Substitute low-fat yogurt or cottage cheese for all or part of the sour cream in recipes for sauces, dips or congealed salads.  Use half yogurt/half mayonnaise in salad recipes.  Substitute evaporated skim milk for cream. Evaporated skim milk or reconstituted non-fat dry milk can be whipped and substituted for whipped cream in certain recipes.  Choose fresh fruits for dessert instead of high-fat foods such as pies or cakes. Fruits are naturally low in fat. When Dining Out   Order low-fat appetizers such as fruit or vegetable juice, pasta with vegetables or tomato sauce.  Select clear, rather than cream soups.  Ask that dressings and gravies be served on the side. Then use less of them.  Order foods that are baked, broiled, poached, steamed, stir-fried, or roasted.  Ask for margarine instead of butter, and use only a small amount.  Drink sparkling water, unsweetened tea or coffee, or diet soft drinks instead of  alcohol or other sweet beverages. QUESTIONS AND ANSWERS ABOUT OTHER FATS IN THE BLOOD: SATURATED FAT, TRANS FAT, AND CHOLESTEROL What is trans fat? Trans fat is a type of fat that is formed when vegetable oil is hardened through a process called hydrogenation. This process helps makes foods more solid, gives them shape, and prolongs their shelf life. Trans fats are also called hydrogenated or partially hydrogenated oils.  What do saturated fat, trans fat, and cholesterol in foods have to do with heart disease? Saturated fat, trans fat, and cholesterol in the diet all raise the level of LDL "bad" cholesterol in the blood. The higher the LDL cholesterol, the greater the risk for coronary heart disease (CHD). Saturated fat and trans fat raise LDL similarly.  What foods contain saturated fat, trans fat, and cholesterol? High amounts of saturated fat are found in animal products, such as fatty cuts of meat, chicken skin, and full-fat dairy products like butter, whole milk, cream, and cheese, and in tropical vegetable oils such as palm, palm kernel, and coconut oil. Trans fat is found in some of the same foods as saturated fat, such as vegetable shortening, some margarines (especially hard or stick margarine), crackers, cookies, baked goods, fried foods, salad dressings, and other processed foods made with partially hydrogenated vegetable oils. Small amounts of trans fat also occur naturally in  some animal products, such as milk products, beef, and lamb. Foods high in cholesterol include liver, other organ meats, egg yolks, shrimp, and full-fat dairy products. How can I use the new food label to make heart-healthy food choices? Check the Nutrition Facts panel of the food label. Choose foods lower in saturated fat, trans fat, and cholesterol. For saturated fat and cholesterol, you can also use the Percent Daily Value (%DV): 5% DV or less is low, and 20% DV or more is high. (There is no %DV for trans fat.) Use the  Nutrition Facts panel to choose foods low in saturated fat and cholesterol, and if the trans fat is not listed, read the ingredients and limit products that list shortening or hydrogenated or partially hydrogenated vegetable oil, which tend to be high in trans fat. POINTS TO REMEMBER:   Discuss your risk for heart disease with your caregivers, and take steps to reduce risk factors.  Change your diet. Choose foods that are low in saturated fat, trans fat, and cholesterol.  Add exercise to your daily routine if it is not already being done. Participate in physical activity of moderate intensity, like brisk walking, for at least 30 minutes on most, and preferably all days of the week. No time? Break the 30 minutes into three, 10-minute segments during the day.  Stop smoking. If you do smoke, contact your caregiver to discuss ways in which they can help you quit.  Do not use street drugs.  Maintain a normal weight.  Maintain a healthy blood pressure.  Keep up with your blood work for checking the fats in your blood as directed by your caregiver. Document Released: 08/14/2004 Document Revised: 04/27/2012 Document Reviewed: 03/12/2009 [facility] Patient Information 2014 [place], [place].     Neta Mends. Kaliya Shreiner M.D.  Health Maintenance  Topic Date Due  . Tetanus/tdap  11/25/1981  . Mammogram  11/25/2012  . Influenza Vaccine  07/11/2013  . Pap Smear  05/18/2016  . Colonoscopy  04/11/2018   Health Maintenance Review

## 2013-05-25 DIAGNOSIS — Z0000 Encounter for general adult medical examination without abnormal findings: Secondary | ICD-10-CM | POA: Insufficient documentation

## 2013-05-25 DIAGNOSIS — E8940 Asymptomatic postprocedural ovarian failure: Secondary | ICD-10-CM | POA: Insufficient documentation

## 2013-06-14 ENCOUNTER — Encounter: Payer: Self-pay | Admitting: Internal Medicine

## 2013-06-14 ENCOUNTER — Ambulatory Visit (INDEPENDENT_AMBULATORY_CARE_PROVIDER_SITE_OTHER): Payer: BC Managed Care – PPO | Admitting: Internal Medicine

## 2013-06-14 VITALS — BP 94/62 | HR 72 | Ht 63.0 in | Wt 184.0 lb

## 2013-06-14 DIAGNOSIS — G478 Other sleep disorders: Secondary | ICD-10-CM

## 2013-06-14 DIAGNOSIS — G4730 Sleep apnea, unspecified: Secondary | ICD-10-CM

## 2013-06-14 DIAGNOSIS — G4733 Obstructive sleep apnea (adult) (pediatric): Secondary | ICD-10-CM

## 2013-06-14 NOTE — Progress Notes (Signed)
06/14/13- 52 yoF never smoker Referred by Dr. Fabian Sharp for snoring and wakes up gasping for air. Known loud snore. Wakes gasping but not coughing or refluxing. Does have some GERD. Avoids caffeine due to tachypalpitation.. Past hx turbinate reduction, septoplaty. No cardiopulmonary disease. Has had daytime sleepiness for 15 years. Bedtime midnight to 1 AM, very short sleep latency, little waking during the night before up between 9 and 10 AM. 25 pound weight gain in the last 2 years. A previous sleep study was done in [place] about 1999 with results uncertain but she never wore CPAP. Denies history of cardiopulmonary disease but is treated for GERD.  Mother died of "lung cancer related to reflux". Not aware of any nocturnal coughing or choking. Has nasal surgery for reduction of turbinates and septoplasty around 2001. Some postnasal drainage. Previous skin test positive only for cat. Married, gravida Contractor.  Prior to Admission medications   Medication Sig Start Date End Date Taking? Authorizing Provider  estradiol (ESTRACE) 0.5 MG tablet Take 0.5 mg by mouth daily.   Yes Jeani Hawking, MD  metFORMIN (GLUCOPHAGE-XR) 500 MG 24 hr tablet Take 1 tablet (500 mg total) by mouth daily with breakfast. 05/24/13  Yes Madelin Headings, MD   Past Medical History  Diagnosis Date  . Nephrolithiasis   . Palpitations   . HLD (hyperlipidemia)   . GERD (gastroesophageal reflux disease)   . Cough   . Dyspnea   . Other specified congenital anomaly of kidney   . Precordial pain     cardiac evaluation  . Hypercholesterolemia     pure  . Pre-diabetes   . NEPHROLITHIASIS 02/22/2009    Qualifier: Diagnosis of  By: Excell Seltzer CMA, Lawson Fiscal    . Transfusion history     placental abruption 1986  . Hx of varicella   . H/O osteopenia   . Hx of abdominal pain     10 13 neg ct but concern about colitis other  . Endometriosis 1996   Past Surgical History  Procedure Laterality Date  . Abdominal surgery      for  congenital kidney abnormality  . Nose surgery    . Cesarean section  [id number]  . Abdominal hysterectomy    . Urecteroscopic    . Lithotripsy  1992-1996    multiple  . Total abdominal hysterectomy w/ bilateral salpingoophorectomy    . Oopherctomy 94 and 96     Family History  Problem Relation Age of Onset  . Cancer Mother     Colon cancer, breast cancer and lung cancer  . Tremor Mother   . Colon cancer Mother   . Arthritis Father   . Vision loss Father     Macular degeneration  . Scoliosis Father   . Asthma Sister   . Asthma Brother   . Osteoporosis Maternal Grandmother   . Other Sister     twin   NAFLD  . Esophageal cancer Neg Hx   . Rectal cancer Neg Hx   . Stomach cancer Neg Hx   . Lung cancer Mother     never smoked or around cig smoke   History   Social History  . Marital Status: Married    Spouse Name: N/A    Number of Children: N/A  . Years of Education: N/A   Occupational History  . Risk analyst    Social History Main Topics  . Smoking status: Never Smoker   . Smokeless tobacco: Never Used  . Alcohol Use: No  .  Drug Use: No  . Sexual Activity: Yes    Birth Control/ Protection: Surgical   Other Topics Concern  . Not on file   Social History Narrative   Usually gets 9 hours of sleep per night   4 people living in the home.   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 HEENT:   + headaches, +difficulty swallowing, no-tooth/dental problems, +sore throat,      + sneezing, no-itching, +ear ache, nasal congestion, post nasal drip,  CV:  No-   chest pain, orthopnea, PND, swelling in lower extremities, anasarca, dizziness, +palpitations Resp: + shortness of breath with exertion or at rest.              + productive cough,  No non-productive cough,  No- coughing up of blood.               No-   change in color of mucus.  No- wheezing.   Skin: No-   rash or lesions. GI:  +heartburn, indigestion, no-abdominal pain, nausea, vomiting, diarrhea,                 change in bowel habits, loss of appetite GU: No-   dysuria, change in color of urine, no urgency or frequency.  No- flank pain. MS:  No-   joint pain or swelling.  No- decreased range of motion.  No- back pain. Neuro-     nothing unusual Psych:  No- change in mood or affect. + anxiety.  No memory loss.  OBJ- Physical Exam General- Alert, Oriented, Affect-appropriate, Distress- none acute Skin- rash-none, lesions- none, excoriation- none Lymphadenopathy- none Head- atraumatic            Eyes- Gross vision intact, PERRLA, conjunctivae and secretions clear, overweight            Ears- Hearing, canals-normal            Nose- Clear, no-Septal dev, mucus, polyps, erosion, perforation             Throat- Mallampati III , mucosa clear , drainage- none, tonsils- atrophic, + own teeth Neck- flexible , trachea midline, no stridor , thyroid nl, carotid no bruit Chest - symmetrical excursion , unlabored           Heart/CV- RRR , no murmur , no gallop  , no rub, nl s1 s2                           - JVD- none , edema- none, stasis changes- none, varices- none           Lung- clear to P&A, wheeze- none, cough- none , dullness-none, rub- none           Chest wall-  Abd- tender-no, distended-no, bowel sounds-present, HSM- no Br/ Gen/ Rectal- Not done, not indicated Extrem- cyanosis- none, clubbing, none, atrophy- none, strength- nl Neuro- grossly intact to observation

## 2013-06-14 NOTE — Patient Instructions (Addendum)
Order- Schedule split protocol NPSG   Dx OSA  

## 2013-06-21 ENCOUNTER — Encounter (HOSPITAL_BASED_OUTPATIENT_CLINIC_OR_DEPARTMENT_OTHER): Payer: BC Managed Care – PPO

## 2013-06-22 NOTE — Assessment & Plan Note (Signed)
History and physical exam support high probability of obstructive sleep apnea. We discussed sleep habits. Plan-schedule sleep study

## 2013-07-08 ENCOUNTER — Ambulatory Visit (HOSPITAL_BASED_OUTPATIENT_CLINIC_OR_DEPARTMENT_OTHER): Payer: BC Managed Care – PPO | Attending: Internal Medicine | Admitting: Sleep Medicine

## 2013-07-08 VITALS — Ht 63.0 in | Wt 185.0 lb

## 2013-07-08 DIAGNOSIS — Z87898 Personal history of other specified conditions: Secondary | ICD-10-CM

## 2013-07-08 DIAGNOSIS — R0609 Other forms of dyspnea: Secondary | ICD-10-CM | POA: Insufficient documentation

## 2013-07-08 DIAGNOSIS — R0989 Other specified symptoms and signs involving the circulatory and respiratory systems: Secondary | ICD-10-CM | POA: Insufficient documentation

## 2013-07-08 DIAGNOSIS — G4733 Obstructive sleep apnea (adult) (pediatric): Secondary | ICD-10-CM

## 2013-07-10 DIAGNOSIS — G4733 Obstructive sleep apnea (adult) (pediatric): Secondary | ICD-10-CM

## 2013-07-20 ENCOUNTER — Encounter (HOSPITAL_BASED_OUTPATIENT_CLINIC_OR_DEPARTMENT_OTHER): Payer: BC Managed Care – PPO

## 2013-08-02 ENCOUNTER — Ambulatory Visit (INDEPENDENT_AMBULATORY_CARE_PROVIDER_SITE_OTHER): Payer: BC Managed Care – PPO | Admitting: Internal Medicine

## 2013-08-02 ENCOUNTER — Encounter: Payer: Self-pay | Admitting: Internal Medicine

## 2013-08-02 VITALS — BP 116/78 | HR 80 | Ht 63.0 in | Wt 187.4 lb

## 2013-08-02 DIAGNOSIS — G4733 Obstructive sleep apnea (adult) (pediatric): Secondary | ICD-10-CM

## 2013-08-02 NOTE — Progress Notes (Signed)
06/14/13- Meredith Finley never smoker Referred by Dr. Fabian Sharp for snoring and wakes up gasping for air. Known loud snore. Wakes gasping but not coughing or refluxing. Does have some GERD. Avoids caffeine due to tachypalpitation.. Past hx turbinate reduction, septoplaty. No cardiopulmonary disease. Has had daytime sleepiness for 15 years. Bedtime midnight to 1 AM, very short sleep latency, little waking during the night before up between 9 and 10 AM. 25 pound weight gain in the last 2 years. A previous sleep study was done in [place] about 1999 with results uncertain but she never wore CPAP. Denies history of cardiopulmonary disease but is treated for GERD.  Mother died of "lung cancer related to reflux". Not aware of any nocturnal coughing or choking. Has nasal surgery for reduction of turbinates and septoplasty around 2001. Some postnasal drainage. Previous skin test positive only for cat. Married, gravida Contractor.  08/02/13- Meredith Finley never smoker Referred by Dr. Fabian Sharp for snoring and wakes up gasping for air. FOLLOWS FOR: review sleep study with patient. Asks to defer flu vax. Describes feeling "really exhausted" and knows that she snores loudly. NPSG 07/08/13- Mild OSA AHI  7.1/hr, Desat to 89%, Epworth 14/24, weight 185 Lbs. Husband uses CPAP with [facility] and she wants to try after appropriate discussion.  ROS-see HPI Constitutional:   No-   weight loss, night sweats, fevers, chills, +fatigue, lassitude. HEENT:   + headaches, +difficulty swallowing, no-tooth/dental problems, +sore throat,      + sneezing, no-itching, +ear ache, nasal congestion, post nasal drip,  CV:  No-   chest pain, orthopnea, PND, swelling in lower extremities, anasarca, dizziness, +palpitations Resp: + shortness of breath with exertion or at rest.              + productive cough,  No non-productive cough,  No- coughing up of blood.              No-   change in color of mucus.  No- wheezing.   Skin: No-   rash or  lesions. GI:  +heartburn, indigestion, no-abdominal pain, nausea, vomiting, GU:  MS:  No-   joint pain or swelling.   Neuro-     nothing unusual Psych:  No- change in mood or affect. + anxiety.  No memory loss.  OBJ- Physical Exam General- Alert, Oriented, Affect-appropriate, Distress- none acute Skin- rash-none, lesions- none, excoriation- none Lymphadenopathy- none Head- atraumatic            Eyes- Gross vision intact, PERRLA, conjunctivae and secretions clear, overweight            Ears- Hearing, canals-normal            Nose- Clear, no-Septal dev, mucus, polyps, erosion, perforation             Throat- Mallampati III , mucosa clear , drainage- none, tonsils- atrophic, + own teeth Neck- flexible , trachea midline, no stridor , thyroid nl, carotid no bruit Chest - symmetrical excursion , unlabored           Heart/CV- RRR , no murmur , no gallop  , no rub, nl s1 s2                           - JVD- none , edema- none, stasis changes- none, varices- none           Lung- clear to P&A, wheeze- none, cough- none , dullness-none, rub- none  Chest wall-  Abd-  Br/ Gen/ Rectal- Not done, not indicated Extrem- cyanosis- none, clubbing, none, atrophy- none, strength- nl Neuro- grossly intact to observation

## 2013-08-02 NOTE — Patient Instructions (Addendum)
Order- DME [facility] new CPAP autotitrate 5-15 x 7 days for pressure recommendation, mask of choice, supplies, humidifier     Dx OSA

## 2013-08-14 NOTE — Assessment & Plan Note (Signed)
Mild obstructive sleep apnea. She is symptomatic with daytime sleepiness. She is familiar with CPAP because her husband uses it and she feels its the more appropriate place to start for her. Plan-begin CPAP using [facility] which already provides DME services for her husband's CPAP

## 2013-09-20 ENCOUNTER — Encounter: Payer: Self-pay | Admitting: Internal Medicine

## 2013-09-20 ENCOUNTER — Ambulatory Visit (INDEPENDENT_AMBULATORY_CARE_PROVIDER_SITE_OTHER): Payer: BC Managed Care – PPO | Admitting: Internal Medicine

## 2013-09-20 VITALS — BP 102/66 | HR 71 | Ht 63.0 in | Wt 190.0 lb

## 2013-09-20 DIAGNOSIS — G4733 Obstructive sleep apnea (adult) (pediatric): Secondary | ICD-10-CM

## 2013-09-20 NOTE — Progress Notes (Signed)
06/14/13- 52 yoF never smoker Referred by Dr. Fabian Sharp for snoring and wakes up gasping for air. Known loud snore. Wakes gasping but not coughing or refluxing. Does have some GERD. Avoids caffeine due to tachypalpitation.. Past hx turbinate reduction, septoplaty. No cardiopulmonary disease. Has had daytime sleepiness for 15 years. Bedtime midnight to 1 AM, very short sleep latency, little waking during the night before up between 9 and 10 AM. 25 pound weight gain in the last 2 years. A previous sleep study was done in [place] about 1999 with results uncertain but she never wore CPAP. Denies history of cardiopulmonary disease but is treated for GERD.  Mother died of "lung cancer related to reflux". Not aware of any nocturnal coughing or choking. Has nasal surgery for reduction of turbinates and septoplasty around 2001. Some postnasal drainage. Previous skin test positive only for cat. Married, gravida Contractor.  08/02/13- 52 yoF never smoker Referred by Dr. Fabian Sharp for snoring and wakes up gasping for air. FOLLOWS FOR: review sleep study with patient. Asks to defer flu vax. Describes feeling "really exhausted" and knows that she snores loudly. NPSG 07/08/13- Mild OSA AHI  7.1/hr, Desat to 89%, Epworth 14/24, weight 185 Lbs. Husband uses CPAP with [facility] and she wants to try after appropriate discussion.  09/20/13- 52 yoF never smoker Referred by Dr. Fabian Sharp for snoring and wakes up gasping for air. FOLLOWS FOR: Currently using CPAP for 9 hours per night. Feels well rested the next day after use. Denies problems with the machine, mask or pressure. CPAP / [facility]- started on Auto 5-15 pending download. So far so good. Better rested with no snoring.  ROS-see HPI Constitutional:   No-   weight loss, night sweats, fevers, chills, fatigue, lassitude. HEENT:   + headaches, +difficulty swallowing, no-tooth/dental problems, +sore throat,      No-sneezing, no-itching, +no-ear ache, nasal congestion,  post nasal drip,  CV:  No-   chest pain, orthopnea, PND, swelling in lower extremities, anasarca, dizziness, +palpitations Resp: + shortness of breath with exertion or at rest.              No- productive cough,  No non-productive cough,  No- coughing up of blood.              No-   change in color of mucus.  No- wheezing.   Skin: No-   rash or lesions. GI:  +heartburn, indigestion, no-abdominal pain, nausea, vomiting, GU:  MS:  No-   joint pain or swelling.   Neuro-     nothing unusual Psych:  No- change in mood or affect. + anxiety.  No memory loss.  OBJ- Physical Exam General- Alert, Oriented, Affect-appropriate, Distress- none acute Skin- rash-none, lesions- none, excoriation- none Lymphadenopathy- none Head- atraumatic            Eyes- Gross vision intact, PERRLA, conjunctivae and secretions clear, overweight            Ears- Hearing, canals-normal            Nose- Clear, no-Septal dev, mucus, polyps, erosion, perforation             Throat- Mallampati III , mucosa clear , drainage- none, tonsils- atrophic, + own teeth Neck- flexible , trachea midline, no stridor , thyroid nl, carotid no bruit Chest - symmetrical excursion , unlabored           Heart/CV- RRR , no murmur , no gallop  , no rub, nl s1 s2                           -  JVD- none , edema- none, stasis changes- none, varices- none           Lung- clear to P&A, wheeze- none, cough- none , dullness-none, rub- none           Chest wall-  Abd-  Br/ Gen/ Rectal- Not done, not indicated Extrem- cyanosis- none, clubbing, none, atrophy- none, strength- nl Neuro- grossly intact to observation

## 2013-09-20 NOTE — Patient Instructions (Signed)
My staff will contact [facility] for a download of your CPAP. Based on that, I may make a change in your pressures. Please let me know if we make a change that isn't comfortable.

## 2013-09-28 ENCOUNTER — Telehealth: Payer: Self-pay | Admitting: Internal Medicine

## 2013-09-28 DIAGNOSIS — G4733 Obstructive sleep apnea (adult) (pediatric): Secondary | ICD-10-CM

## 2013-09-28 NOTE — Telephone Encounter (Signed)
Please advise Meredith Finley if any download has been received on pt? thanks

## 2013-09-28 NOTE — Telephone Encounter (Signed)
I spoke with pt. She reports [facility] has tried faxing results on 08/15/13, 09/07/13,  09/21/13. Received verification all fax went through to [phone]

## 2013-09-28 NOTE — Telephone Encounter (Signed)
Please advise Katie--as triage protocol we do not call for results. thanks

## 2013-09-28 NOTE — Telephone Encounter (Signed)
CY or myself have any results on this patient. Please find out when [facility] was faxing the results over to [place]. Thanks.

## 2013-10-03 NOTE — Assessment & Plan Note (Signed)
Good initial compliance and control, pending download from [facility]

## 2013-10-04 NOTE — Telephone Encounter (Signed)
Results given to CY to review and based on results we can send order to [facility] for set CPAP fixed at 10. DX OSA. Call to patient and order can wait until tomorrow per CY.

## 2013-10-04 NOTE — Telephone Encounter (Signed)
I don't understand why there seems to be some problem with this. It may just be in transition waiting to be scanned downstairs. If any question, can we give the DME a different fax number here.

## 2013-10-04 NOTE — Telephone Encounter (Signed)
Spoke with [facility]-they are refaxing results to triage fax number so CY may review and advise asap for patient.

## 2013-10-05 ENCOUNTER — Telehealth: Payer: Self-pay | Admitting: Internal Medicine

## 2013-10-05 NOTE — Telephone Encounter (Signed)
Mouth breathing may affect scores, but we have to start somewhere. She can talk with her DME about either a full-face mask, or a cin strap to  Keep mouth closed.

## 2013-10-05 NOTE — Telephone Encounter (Signed)
Per message below, pt CPAP to be set on fixed pressure of 10. Meredith Finley, CMA at 10/04/2013 5:22 PM     Status: Signed        Results given to CY to review and based on results we can send order to [facility] for set CPAP fixed at 10. DX OSA. Call to patient and order can wait until tomorrow per CY.    Pt has some concerns with setting this pressure--husband told patient that she breathes a lot through her mouth, pt wanting to know if this can possibly mess up the results and cause her recommended pressures to be higher?  Please advise Dr Maple Hudson. Thanks.

## 2013-10-05 NOTE — Telephone Encounter (Signed)
Spoke with husband in regards to recs pertaining to pt CPAP. Pt husband sounded very receptive to giving this a try-- will contact DME and have them try our different mask/chin strap. Will contact our office if any further concerns/questions.

## 2013-10-05 NOTE — Telephone Encounter (Signed)
I spoke with patient about results and she verbalized understanding and had no questions 

## 2013-10-14 ENCOUNTER — Telehealth: Payer: Self-pay | Admitting: Internal Medicine

## 2013-10-14 DIAGNOSIS — G4733 Obstructive sleep apnea (adult) (pediatric): Secondary | ICD-10-CM

## 2013-10-14 NOTE — Telephone Encounter (Signed)
Per CY-let's decrease pressure to 8. I have placed order to [facility].

## 2013-10-14 NOTE — Telephone Encounter (Signed)
[email or web address]  

## 2013-10-14 NOTE — Telephone Encounter (Signed)
Pt returned triage's call.  Holly D Pryor ° °

## 2013-10-14 NOTE — Telephone Encounter (Signed)
Pt is aware of this change

## 2013-12-15 ENCOUNTER — Encounter: Payer: Self-pay | Admitting: Internal Medicine

## 2014-01-04 ENCOUNTER — Telehealth: Payer: Self-pay | Admitting: Internal Medicine

## 2014-01-04 DIAGNOSIS — G4733 Obstructive sleep apnea (adult) (pediatric): Secondary | ICD-10-CM

## 2014-01-04 NOTE — Telephone Encounter (Signed)
Pt's CPAP was decreased to 8 back in 10-2013. Pt is aware that CY is out of office this afternoon and may not be until tomorrow or Friday (due to expected weather) before we have an answer. Pt states that [facility] sent download results to [place] on 11-18-13. CY please advise.

## 2014-01-04 NOTE — Telephone Encounter (Signed)
Per CY-lets send order to reduce CPAP to 7. Thanks.

## 2014-01-04 NOTE — Telephone Encounter (Signed)
Pt aware that order has been placed to [facility].

## 2014-09-11 ENCOUNTER — Encounter: Payer: Self-pay | Admitting: Internal Medicine

## 2015-04-19 ENCOUNTER — Telehealth: Payer: Self-pay | Admitting: Internal Medicine

## 2015-04-19 NOTE — Telephone Encounter (Signed)
[place] [facility] - Client [place] [facility]  Patient Name: [facility]  DOB: Aug 06, 1961    Initial Comment Caller states BP has been running low    Nurse Assessment  Nurse: Christel Mormon, RN, Levada Dy Date/Time (Eastern Time): 04/19/2015 4:12:09 PM  Confirm and document reason for call. If symptomatic, describe symptoms. ---BP 99/61 currently and has been a low as 88/58 about 6 wks ago. She does not take BP medication. She states she feels "really low energy, cruddy". She has tingling in her fingers in her right hand for several months. She states her BP normally runs 100/70.  Has the patient traveled out of the country within the last 30 days? ---No  Does the patient require triage? ---Yes  Related visit to physician within the last 2 weeks? ---No  Does the PT have any chronic conditions? (i.e. diabetes, asthma, etc.) ---Yes  List chronic conditions. ---normally low BP, BS being monitored  Did the patient indicate they were pregnant? ---No     Guidelines    Guideline Title Affirmed Question Affirmed Notes  Low Blood Pressure Wants doctor to measure BP    Final Disposition User   See PCP When Office is Open (within 3 days) [place], Therapist, sports, [facility]

## 2015-04-20 ENCOUNTER — Ambulatory Visit (INDEPENDENT_AMBULATORY_CARE_PROVIDER_SITE_OTHER): Payer: BLUE CROSS/BLUE SHIELD | Admitting: Family Medicine

## 2015-04-20 ENCOUNTER — Encounter: Payer: Self-pay | Admitting: Family Medicine

## 2015-04-20 VITALS — BP 86/50 | HR 89 | Temp 98.1°F | Wt 192.0 lb

## 2015-04-20 DIAGNOSIS — R42 Dizziness and giddiness: Secondary | ICD-10-CM

## 2015-04-20 DIAGNOSIS — I959 Hypotension, unspecified: Secondary | ICD-10-CM | POA: Diagnosis not present

## 2015-04-20 LAB — CBC WITH DIFFERENTIAL/PLATELET
BASOS PCT: 1 % (ref 0–1)
Basophils Absolute: 0.1 10*3/uL (ref 0.0–0.1)
EOS ABS: 0.2 10*3/uL (ref 0.0–0.7)
EOS PCT: 2 % (ref 0–5)
HCT: 41.1 % (ref 36.0–46.0)
HEMOGLOBIN: 14 g/dL (ref 12.0–15.0)
LYMPHS PCT: 33 % (ref 12–46)
Lymphs Abs: 3.3 10*3/uL (ref 0.7–4.0)
MCH: 30.7 pg (ref 26.0–34.0)
MCHC: 34.1 g/dL (ref 30.0–36.0)
MCV: 90.1 fL (ref 78.0–100.0)
MONO ABS: 0.5 10*3/uL (ref 0.1–1.0)
MPV: 10.7 fL (ref 8.6–12.4)
Monocytes Relative: 5 % (ref 3–12)
Neutro Abs: 5.8 10*3/uL (ref 1.7–7.7)
Neutrophils Relative %: 59 % (ref 43–77)
PLATELETS: 307 10*3/uL (ref 150–400)
RBC: 4.56 MIL/uL (ref 3.87–5.11)
RDW: 13.5 % (ref 11.5–15.5)
WBC: 9.9 10*3/uL (ref 4.0–10.5)

## 2015-04-20 NOTE — Patient Instructions (Signed)
Hypotension  As your heart beats, it forces blood through your arteries. This force is your blood pressure. If your blood pressure is too low for you to go about your normal activities or to support the organs of your body, you have hypotension. Hypotension is also referred to as low blood pressure. When your blood pressure becomes too low, you may not get enough blood to your brain. As a result, you may feel weak, feel lightheaded, or develop a rapid heart rate. In a more severe case, you may faint.  CAUSES  Various conditions can cause hypotension. These include:  · Blood loss.  · Dehydration.  · Heart or endocrine problems.  · Pregnancy.  · Severe infection.  · Not having a well-balanced diet filled with needed nutrients.  · Severe allergic reactions (anaphylaxis).  Some medicines, such as blood pressure medicine or water pills (diuretics), may lower your blood pressure below normal. Sometimes taking too much medicine or taking medicine not as directed can cause hypotension.  TREATMENT   Hospitalization is sometimes required for hypotension if fluid or blood replacement is needed, if time is needed for medicines to wear off, or if further monitoring is needed. Treatment might include changing your diet, changing your medicines (including medicines aimed at raising your blood pressure), and use of support stockings.  HOME CARE INSTRUCTIONS   · Drink enough fluids to keep your urine clear or pale yellow.  · Take your medicines as directed by your health care provider.  · Get up slowly from reclining or sitting positions. This gives your blood pressure a chance to adjust.  · Wear support stockings as directed by your health care provider.  · Maintain a healthy diet by including nutritious food, such as fruits, vegetables, nuts, whole grains, and lean meats.  SEEK MEDICAL CARE IF:  · You have vomiting or diarrhea.  · You have a fever for more than 2-3 days.  · You feel more thirsty than usual.  · You feel weak and  tired.  SEEK IMMEDIATE MEDICAL CARE IF:   · You have chest pain or a fast or irregular heartbeat.  · You have a loss of feeling in some part of your body, or you lose movement in your arms or legs.  · You have trouble speaking.  · You become sweaty or feel lightheaded.  · You faint.  MAKE SURE YOU:   · Understand these instructions.  · Will watch your condition.  · Will get help right away if you are not doing well or get worse.  Document Released: 10/27/2005 Document Revised: 08/17/2013 Document Reviewed: 04/29/2013  [employer]® Patient Information ©2015 [employer]. This information is not intended to replace advice given to you by your health care provider. Make sure you discuss any questions you have with your health care provider.

## 2015-04-20 NOTE — Progress Notes (Signed)
Pre visit review using our clinic review tool, if applicable. No additional management support is needed unless otherwise documented below in the visit note. 

## 2015-04-20 NOTE — Progress Notes (Signed)
Subjective:    Patient ID: Meredith Finley, female    DOB: 1961-03-21, 54 y.o.   MRN: [id number]  HPI Patient seen for concern for "low blood pressure ". In looking back over her records she has actually had fairly low blood pressure for quite some time. She has occasional lightheadedness but not consistently. She's not describing any consistent orthostatic symptoms. She is currently not taking any regular prescription medications. She has occasional mild palpitations. No history of syncope. Patient had stress echo and event monitor 2010 which was basically unremarkable. Her blood pressure tends to run around 06Y to 69S systolic and 85I diastolic.  No history of anemia. No recent labs. History of gestational diabetes. History of obstructive sleep apnea. Generally stays well-hydrated.  Past Medical History  Diagnosis Date  . Nephrolithiasis   . Palpitations   . HLD (hyperlipidemia)   . GERD (gastroesophageal reflux disease)   . Cough   . Dyspnea   . Other specified congenital anomaly of kidney   . Precordial pain     cardiac evaluation  . Hypercholesterolemia     pure  . Pre-diabetes   . NEPHROLITHIASIS 02/22/2009    Qualifier: Diagnosis of  By: Burt Knack CMA, Cecille Rubin    . Transfusion history     placental abruption 1986  . Hx of varicella   . H/O osteopenia   . Hx of abdominal pain     10 13 neg ct but concern about colitis other  . Endometriosis 1996   Past Surgical History  Procedure Laterality Date  . Abdominal surgery      for congenital kidney abnormality  . Nose surgery    . Cesarean section  [id number]  . Abdominal hysterectomy    . Urecteroscopic    . Lithotripsy  1992-1996    multiple  . Total abdominal hysterectomy w/ bilateral salpingoophorectomy    . Oopherctomy 94 and 96      reports that she has never smoked. She has never used smokeless tobacco. She reports that she does not drink alcohol or use illicit drugs. family history includes Arthritis in her father; Asthma  in her brother and sister; Cancer in her mother; Colon cancer in her mother; Lung cancer in her mother; Osteoporosis in her maternal grandmother; Other in her sister; Scoliosis in her father; Tremor in her mother; Vision loss in her father. There is no history of Esophageal cancer, Rectal cancer, or Stomach cancer. Allergies  Allergen Reactions  . Ivp Dye [Iodinated Diagnostic Agents]   . Niacin Nausea And Vomiting and Rash      Review of Systems  Constitutional: Negative for fever, chills, appetite change and unexpected weight change.  Respiratory: Negative for shortness of breath.   Cardiovascular: Negative for chest pain, palpitations and leg swelling.  Endocrine: Positive for polyuria. Negative for cold intolerance, heat intolerance and polydipsia.  Genitourinary: Negative for dysuria.  Neurological: Positive for dizziness and light-headedness. Negative for weakness.       Objective:   Physical Exam  Constitutional: She is oriented to person, place, and time. She appears well-developed and well-nourished.  HENT:  Head: Normocephalic and atraumatic.  Mouth/Throat: Oropharynx is clear and moist.  Neck: Neck supple. No JVD present. No thyromegaly present.  Cardiovascular: Normal rate and regular rhythm.   No murmur heard. Pulmonary/Chest: Effort normal and breath sounds normal. No respiratory distress. She has no wheezes. She has no rales.  Musculoskeletal: She exhibits no edema.  Lymphadenopathy:    She has no cervical adenopathy.  Neurological: She is alert and oriented to person, place, and time. She has normal reflexes. No cranial nerve deficit. Coordination normal.  Full strength throughout          Assessment & Plan:  Low blood pressure. Not clear this is much different from what she has had chronically for some time. She has occasional lightheadedness but no orthostatic change today. In fact, sitting blood pressure 90/60 and standing 98/60. Clinically does not appear  anemic or dehydrated. Check labs with CBC, TSH, basic metabolic panel. Stay well-hydrated. Consider supplementation with electrolyte replacement such as G2

## 2015-04-21 LAB — BASIC METABOLIC PANEL
BUN: 11 mg/dL (ref 6–23)
CO2: 27 mEq/L (ref 19–32)
Calcium: 9.4 mg/dL (ref 8.4–10.5)
Chloride: 105 mEq/L (ref 96–112)
Creat: 0.65 mg/dL (ref 0.50–1.10)
GLUCOSE: 116 mg/dL — AB (ref 70–99)
Potassium: 3.8 mEq/L (ref 3.5–5.3)
Sodium: 140 mEq/L (ref 135–145)

## 2015-04-21 LAB — TSH: TSH: 0.837 u[IU]/mL (ref 0.350–4.500)

## 2015-09-10 ENCOUNTER — Telehealth: Payer: Self-pay | Admitting: Cardiology

## 2015-09-10 NOTE — Telephone Encounter (Signed)
Pt called in stating that she was a pt of Dr. Jacalyn Lefevre about 5 years ago and she would like to come back in to get checked out. She says that she has been having a BP that ranges around 86/50 along with some palpitations. She says that she came to the [facility] office and wanted to know if she was ok wait until 12/7 for the next appt or if she needed to be seen sooner. Please f/u with her  Thanks

## 2015-09-10 NOTE — Telephone Encounter (Signed)
Returned call to patient who complains of chronic low blood pressure - 86/50, 88/58. Of note, she saw Dr. Elease Hashimoto in June for this issue - labs were OK and she was advised to liberalize sodium and stay hydrated. She reports she has done this. She reports a lot of palpitations, almost an arrhythmia feeing, feels weak, discomfort in middle of chest list if you squeeze a muscle. She is no longer on metformin, only takes estrace. She would like an OV sooner than the 12/7 new patient appointment that is available for Dr. Stanford Breed in [facility]. Informed patient I will send message to his nurse Hilda Blades to assist with arranging - patient willing to come to [place].

## 2015-09-20 ENCOUNTER — Ambulatory Visit (INDEPENDENT_AMBULATORY_CARE_PROVIDER_SITE_OTHER): Payer: BLUE CROSS/BLUE SHIELD | Admitting: Internal Medicine

## 2015-09-20 ENCOUNTER — Encounter: Payer: Self-pay | Admitting: Internal Medicine

## 2015-09-20 VITALS — BP 110/70 | Temp 97.8°F | Wt 193.5 lb

## 2015-09-20 DIAGNOSIS — R251 Tremor, unspecified: Secondary | ICD-10-CM | POA: Diagnosis not present

## 2015-09-20 DIAGNOSIS — R739 Hyperglycemia, unspecified: Secondary | ICD-10-CM | POA: Diagnosis not present

## 2015-09-20 DIAGNOSIS — E785 Hyperlipidemia, unspecified: Secondary | ICD-10-CM | POA: Diagnosis not present

## 2015-09-20 DIAGNOSIS — I959 Hypotension, unspecified: Secondary | ICD-10-CM | POA: Diagnosis not present

## 2015-09-20 MED ORDER — METFORMIN HCL ER 500 MG PO TB24: 500.0000 mg | ORAL_TABLET | Freq: Every day | ORAL | Status: DC

## 2015-09-20 NOTE — Progress Notes (Signed)
Pre visit review using our clinic review tool, if applicable. No additional management support is needed unless otherwise documented below in the visit note.  Chief Complaint  Patient presents with  . Follow-up    bp medication tremor concerns    HPI: Meredith Finley 54 y.o. comes in for a number of issues . Last visit with me was 2014   Saw dr B summer with dizziness low bp  And had labs done  Low bp  Readings   And gets  Tired  And weakness  feeling  And palpitations.    Had cards eval  Neg .  But bp was good at that time    90 over 60.ranges and ocass lower  No exercise at this time  Very busty  No syncope .  Needs metformin  Not on this for a while   For pre diabetes? dysmetabolism syndrome  ROS: See pertinent positives and negatives per HPI.  fam hx tremor   Patient has some tremor left side   Hand  Had nuerio check years ago ? Few spots on mri but no dx     Adrenal  Leukodystrophy. Gene in cousin   Couseling.   Mom had tremor 40s  .   age 46 . Passes and had undescribed neuro disease .  Has some concern isf any of this could be related to her genetic cousin   No  Skin change  Other mostly right had sometimes drops thinks  No weakness  No fam hx parkinsons    Daughter with hashimotos would like t4 checked if possible  Past Medical History  Diagnosis Date  . Nephrolithiasis   . Palpitations   . HLD (hyperlipidemia)   . GERD (gastroesophageal reflux disease)   . Cough   . Dyspnea   . Other specified congenital anomaly of kidney   . Precordial pain     cardiac evaluation  . Hypercholesterolemia     pure  . Pre-diabetes   . NEPHROLITHIASIS 02/22/2009    Qualifier: Diagnosis of  By: Burt Knack CMA, Cecille Rubin    . Transfusion history     placental abruption 1986  . Hx of varicella   . H/O osteopenia   . Hx of abdominal pain     10 13 neg ct but concern about colitis other  . Endometriosis 1996    Family History  Problem Relation Age of Onset  . Cancer Mother     Colon cancer,  breast cancer and lung cancer  . Tremor Mother   . Colon cancer Mother   . Arthritis Father   . Vision loss Father     Macular degeneration  . Scoliosis Father   . Asthma Sister   . Asthma Brother   . Osteoporosis Maternal Grandmother   . Other Sister     twin   NAFLD  . Esophageal cancer Neg Hx   . Rectal cancer Neg Hx   . Stomach cancer Neg Hx   . Lung cancer Mother     never smoked or around cig smoke    Social History   Social History  . Marital Status: Married    Spouse Name: N/A  . Number of Children: N/A  . Years of Education: N/A   Occupational History  . Corporate treasurer    Social History Main Topics  . Smoking status: Never Smoker   . Smokeless tobacco: Never Used  . Alcohol Use: No  . Drug Use: No  . Sexual Activity: Yes  Birth Control/ Protection: Surgical   Other Topics Concern  . None   Social History Narrative   Usually gets 9 hours of sleep per night   4 people living in the home.   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 estradiol (ESTRACE) 0.5 MG tablet Take 0.5 mg by mouth daily.    . metFORMIN (GLUCOPHAGE-XR) 500 MG 24 hr tablet Take 1 tablet (500 mg total) by mouth daily with breakfast. 90 tablet 3   No facility-administered medications prior to visit.     EXAM:  BP 110/70 mmHg  Temp(Src) 97.8 F (36.6 C) (Oral)  Wt 193 lb 8 oz (87.771 kg)  Body mass index is 34.29 kg/(m^2). BP 123456 and 123XX123 systolic sitting GENERAL: vitals reviewed and listed above, alert, oriented, appears well hydrated and in no acute distress HEENT: atraumatic, conjunctiva  clear, no obvious abnormalities on inspection of external nose and ears OP : no lesion edema or exudate  NECK: no obvious masses on inspection palpation  LUNGS: clear to auscultation bilaterally, no wheezes, rales or rhonchi, good air  movement CV: HRRR, no clubbing cyanosis or  peripheral edema nl cap refill  MS: moves all extremities without noticeable focal  Abnormality NEURO: oriented x 3 CN 3-12 appear intact. No focal muscle weakness or atrophy.  Gait WNL.  Grossly non focal. No tremor or abnormal movement.seen today  PSYCH: pleasant and cooperative, no obvious depression or anxiety BP Readings from Last 3 Encounters:  09/20/15 110/70  04/20/15 86/50  09/20/13 102/66   Wt Readings from Last 3 Encounters:  09/20/15 193 lb 8 oz (87.771 kg)  04/20/15 192 lb (87.091 kg)  09/20/13 190 lb (86.183 kg)   Lab Results  Component Value Date   WBC 9.9 04/20/2015   HGB 14.0 04/20/2015   HCT 41.1 04/20/2015   PLT 307 04/20/2015   GLUCOSE 116* 04/20/2015   CHOL 224* 05/17/2013   TRIG 362.0* 05/17/2013   HDL 41.40 05/17/2013   LDLDIRECT 136.3 05/17/2013   ALT 18 05/17/2013   AST 21 05/17/2013   NA 140 04/20/2015   K 3.8 04/20/2015   CL 105 04/20/2015   CREATININE 0.65 04/20/2015   BUN 11 04/20/2015   CO2 27 04/20/2015   TSH 0.837 04/20/2015   HGBA1C 5.7 05/17/2013     ASSESSMENT AND PLAN:  Discussed the following assessment and plan:  Hypotension, unspecified hypotension type - ocass weak feeking  with lower readings  .  - Plan: Ambulatory referral to Neurology, TSH, T4, free, Hemoglobin A1c, Cortisol-am, blood, Lipid panel  Tremor of right hand - fam hx of non essential tremor ? dx mom passed with unkn neurodiseasfam hx of  gene of adreno leukodystrophy x linked  - Plan: Ambulatory referral to Neurology, TSH, T4, free, Hemoglobin A1c, Cortisol-am, blood, Lipid panel  Hyperglycemia - prob dysmetabolic syndrome bawed on hx ok to go back on metformin  - Plan: TSH, T4, free, Hemoglobin A1c, Cortisol-am, blood, Lipid panel  HLD (hyperlipidemia) - Plan: TSH, T4, free, Hemoglobin A1c, Cortisol-am, blood, Lipid panel Reviewed plan   Am labs referral  Not really sx of pots but keep cards appt .  Referral to dr  Tat  -Patient advised to return or notify health care team  if symptoms worsen ,  persist or new concerns arise.  Patient Instructions  Plan   Neuro referral for tremor and fam hx   planned .  Get am lab appt first thing  In am  Can do at [facility] office .  Hga1c, tsh free t4 , Cortisol .  LIPID panel .   Agree  with  cardiology to see you again .   Restart  Metformin .      [name]. Panosh M.D.

## 2015-09-20 NOTE — Patient Instructions (Addendum)
Plan   Neuro referral for tremor and fam hx   planned .  Get am lab appt first thing  In am  Can do at [facility] office .  Hga1c, tsh free t4 , Cortisol .  LIPID panel .   Agree  with  cardiology to see you again .   Restart  Metformin .

## 2015-09-25 ENCOUNTER — Other Ambulatory Visit (INDEPENDENT_AMBULATORY_CARE_PROVIDER_SITE_OTHER): Payer: BLUE CROSS/BLUE SHIELD

## 2015-09-25 DIAGNOSIS — E785 Hyperlipidemia, unspecified: Secondary | ICD-10-CM

## 2015-09-25 DIAGNOSIS — R251 Tremor, unspecified: Secondary | ICD-10-CM

## 2015-09-25 DIAGNOSIS — I959 Hypotension, unspecified: Secondary | ICD-10-CM

## 2015-09-25 DIAGNOSIS — R739 Hyperglycemia, unspecified: Secondary | ICD-10-CM

## 2015-09-25 LAB — T4, FREE: FREE T4: 0.78 ng/dL (ref 0.60–1.60)

## 2015-09-25 LAB — TSH: TSH: 2.04 u[IU]/mL (ref 0.35–4.50)

## 2015-09-25 LAB — LDL CHOLESTEROL, DIRECT: LDL DIRECT: 121 mg/dL

## 2015-09-25 LAB — LIPID PANEL
CHOLESTEROL: 199 mg/dL (ref 0–200)
HDL: 38 mg/dL — AB (ref >39.00)
NonHDL: 160.96
Total CHOL/HDL Ratio: 5
Triglycerides: 352 mg/dL — ABNORMAL HIGH (ref 0.0–149.0)
VLDL: 70.4 mg/dL — AB (ref 0.0–40.0)

## 2015-09-25 LAB — HEMOGLOBIN A1C: HEMOGLOBIN A1C: 5.8 % (ref 4.6–6.5)

## 2015-09-26 LAB — CORTISOL-AM, BLOOD: CORTISOL - AM: 19.1 ug/dL (ref 4.3–22.4)

## 2015-10-01 ENCOUNTER — Ambulatory Visit (INDEPENDENT_AMBULATORY_CARE_PROVIDER_SITE_OTHER): Payer: BLUE CROSS/BLUE SHIELD | Admitting: Neurology

## 2015-10-01 ENCOUNTER — Other Ambulatory Visit (INDEPENDENT_AMBULATORY_CARE_PROVIDER_SITE_OTHER): Payer: BLUE CROSS/BLUE SHIELD

## 2015-10-01 ENCOUNTER — Encounter: Payer: Self-pay | Admitting: Neurology

## 2015-10-01 VITALS — BP 100/68 | HR 88 | Ht 63.0 in | Wt 195.0 lb

## 2015-10-01 DIAGNOSIS — M542 Cervicalgia: Secondary | ICD-10-CM | POA: Diagnosis not present

## 2015-10-01 DIAGNOSIS — H02402 Unspecified ptosis of left eyelid: Secondary | ICD-10-CM

## 2015-10-01 DIAGNOSIS — R202 Paresthesia of skin: Secondary | ICD-10-CM | POA: Diagnosis not present

## 2015-10-01 DIAGNOSIS — R292 Abnormal reflex: Secondary | ICD-10-CM

## 2015-10-01 DIAGNOSIS — R5383 Other fatigue: Secondary | ICD-10-CM

## 2015-10-01 DIAGNOSIS — R251 Tremor, unspecified: Secondary | ICD-10-CM

## 2015-10-01 DIAGNOSIS — R7303 Prediabetes: Secondary | ICD-10-CM

## 2015-10-01 LAB — VITAMIN B12: Vitamin B-12: 289 pg/mL (ref 211–911)

## 2015-10-01 LAB — CREATININE, SERUM: CREATININE: 0.6 mg/dL (ref 0.40–1.20)

## 2015-10-01 NOTE — Progress Notes (Signed)
Meredith Finley was seen today in the movement disorders clinic for neurologic consultation at the request of Lottie Dawson, MD.  The consultation is for the evaluation of tremor.  This patient is accompanied in the office by her spouse who supplements the history.  Tremor has been going on in the R hand for 5 years.  She notices it the most at rest; she states that she feels that she is tremoring on the inside.  Her husband reports that he only notices it when the patient points it out to him.  She states that her mother had a tremor that started about the age of 78 but it progressed with time.  She was told that she didn't have PD but she had a "neurologic disorder." She reports that she is worried about adrenoleukodystrophy because a cousin is a carrier of it (sounds like addisons type only).  She also states that she has decreased grasp on the right.  She has numbness mostly over the pinky, ring and middle fingers and travels up to near the elbow.  It is worse at night.  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  She does report that she saw Dr. Leta Baptist a few years ago and she was told that she had a few "white spots" on the brain.  She was told to just "watch it" with time.  His notes are not available   Also reports that she has trouble with incontinence.  This is not stress incontinence but states that "it just leaks."  Also c/o low blood pressure  Specific Symptoms:  Tremor: Yes.     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    Spills soup if on spoon:  No.  Spills glass of liquid if full:  No.  Family Hx tremor:  Yes, in her mother   Voice: no change Sleep: sleeps well (9-10 hours per night; wears cpap and is faithful)  Vivid Dreams:  Yes.     Acting out dreams:  Rarely will scream out Wet Pillows: No. Postural symptoms:  Yes.   but more due to chronic intermittent vertigo  Falls?  No. Bradykinesia symptoms: just cautious when wake up Loss of smell:  Yes.   Loss of taste:  No. Urinary Incontinence:  Yes.  , "it leaks" Difficulty Swallowing:  Yes.  , mostly with liquids Handwriting, micrographia: No. Trouble with ADL's:  No. (does seem to require more effort to put leg in pants)  Trouble buttoning clothing: No. Depression:  No., but intermittent anxiety and easily irritated (finds that unusual for her) Memory changes:  Yes.   (some short term memory loss) Hallucinations:  No.  visual distortions: No. N/V:  No. Lightheaded:  Yes.  , some is due to low BP and states that also has chronic intermittent vertigo  Syncope: No. Diplopia:  No. Dyskinesia:  No.  Neuroimaging has previously been performed.  It is not available for my review today.  I've looked in the canopy system and could not locate it.   ALLERGIES:   Allergies  Allergen Reactions  . Ivp Dye [Iodinated Diagnostic Agents]   . Niacin Nausea And Vomiting and Rash    CURRENT MEDICATIONS:  Outpatient Encounter Prescriptions as of  10/01/2015  Medication Sig  . estradiol (ESTRACE) 0.5 MG tablet Take 0.5 mg by mouth daily.  . metFORMIN (GLUCOPHAGE-XR) 500 MG 24 hr tablet Take 1 tablet (500 mg total) by mouth daily with breakfast.  . Multiple Vitamin (MULTIVITAMIN) tablet Take 1 tablet by mouth daily.   No facility-administered encounter medications on file as of 10/01/2015.    PAST MEDICAL HISTORY:   Past Medical History  Diagnosis Date  . Palpitations   . HLD (hyperlipidemia)   . GERD (gastroesophageal reflux disease)   . Other specified congenital anomaly of kidney   . Precordial pain     cardiac evaluation  . NEPHROLITHIASIS 02/22/2009    Qualifier: Diagnosis of  By: Burt Knack CMA, Cecille Rubin    . Transfusion history     placental abruption 1986  . Hx of  varicella   . H/O osteopenia   . Endometriosis 1996  . Prediabetes     PAST SURGICAL HISTORY:   Past Surgical History  Procedure Laterality Date  . Abdominal surgery      for congenital kidney abnormality  . Nose surgery    . Cesarean section  [id number]  . Abdominal hysterectomy    . Laparoscopic salpingo oopherectomy    . Lithotripsy  1992-1996    multiple    SOCIAL HISTORY:   Social History   Social History  . Marital Status: Married    Spouse Name: N/A  . Number of Children: N/A  . Years of Education: N/A   Occupational History  . Corporate treasurer    Social History Main Topics  . Smoking status: Never Smoker   . Smokeless tobacco: Never Used  . Alcohol Use: No  . Drug Use: No  . Sexual Activity: Yes    Birth Control/ Protection: Surgical   Other Topics Concern  . Not on file   Social History Narrative   Usually gets 9 hours of sleep per night   4 people living in the home.   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 Mother Deceased     colon, breast, lung cancer  . Father Alive     rheumatoid arthritis  . Sister Alive     fatty liver disease  . Brother Alive     asthma  . Brother Alive     healthy  . Brother Alive     healthy  . Daughter Alive     hashimoto's  . Daughter Alive     healthy    ROS:  Some neck pain.  A complete 10 system review of systems was obtained and was unremarkable apart from what is mentioned above.  PHYSICAL EXAMINATION:    VITALS:   Filed Vitals:   10/01/15 1004  BP: 100/68  Pulse: 88  Height: 5\' 3"  (1.6 m)  Weight: 195 lb (88.451 kg)    GEN:  The patient appears stated age and is in NAD. HEENT:  Normocephalic, atraumatic.  The mucous membranes are moist. The superficial temporal arteries are without ropiness or tenderness. CV:  RRR Lungs:  CTAB Neck/HEME:  There are no  carotid bruits bilaterally.  Neurological examination:  Orientation: The patient is alert and oriented x3. Fund of knowledge is appropriate.  Recent and remote memory are intact.  Attention and  concentration are normal.    Able to name objects and repeat phrases. Cranial nerves: There is good facial with the exception of slight left ptosis versus pseudoptosis. Pupils are equal round and reactive to light bilaterally. Fundoscopic exam reveals clear margins bilaterally. Extraocular muscles are intact. The visual fields are full to confrontational testing. The speech is fluent and clear. Soft palate rises symmetrically and there is no tongue deviation. Hearing is intact to conversational tone. Sensation: Sensation is intact to light and pinprick throughout (facial, trunk, extremities).  Pinprick was symmetric, including on the hands.  Vibration is intact at the bilateral big toe. There is no extinction with double simultaneous stimulation. There is no sensory dermatomal level identified. Motor: Strength is 5/5 in the bilateral upper and lower extremities.  There is slight decreased grasp bilaterally, but no asymmetry to this.  Shoulder shrug is equal and symmetric.  There is no pronator drift.  There is no wasting of the intrinsic muscles of the hands. Deep tendon reflexes: Deep tendon reflexes are 2/4 at the bilateral biceps, triceps, brachioradialis, 2+ at the bilateral patella and 2/4 at the bilateral achilles. Plantar responses are downgoing bilaterally.  Movement examination: Tone: There is normal tone in the bilateral upper extremities.  The tone in the lower extremities is normal.  Abnormal movements: No tremor is noted, either at rest or with activation (posture or intention).  No tremors noted with distraction procedures.  She is able to pour a very full glass of water from one glass to another without spilling any of it. Coordination:  There is no decremation with RAM's, with any form of RAMS,  including alternating supination and pronation of the forearm, hand opening and closing, finger taps, heel taps and toe taps. Gait and Station: The patient has no difficulty arising out of a deep-seated chair without the use of the hands. The patient's stride length is normal with normal arm swing.  She has no trouble squatting down low and getting back up without the use of the hands.  She walks in tandem fashion and heel-toe walks without any difficulty.  Stands in Romberg position without trouble.      No results found for: VITAMINB12    Chemistry      Component Value Date/Time   NA 140 04/20/2015 1642   K 3.8 04/20/2015 1642   CL 105 04/20/2015 1642   CO2 27 04/20/2015 1642   BUN 11 04/20/2015 1642   CREATININE 0.65 04/20/2015 1642   CREATININE 0.6 05/17/2013 0812      Component Value Date/Time   CALCIUM 9.4 04/20/2015 1642   ALKPHOS 84 05/17/2013 0812   AST 21 05/17/2013 0812   ALT 18 05/17/2013 0812   BILITOT 0.6 05/17/2013 0812     Lab Results  Component Value Date   TSH 2.04 09/25/2015     ASSESSMENT/PLAN:  1.  Tremor by hx  -no visible tremor on examination today  -As above, the patient was worried about adrenoleukodystrophy because a cousin had this.  I reassured her that I don't think that she has adrenoleukodystrophy.  First, this is a disease that primarily affects males (at least in the most serious variety).  In addition,  tremor is not generally a feature.  Finally, she is a little old for this diagnosis.  She has also had a normal AM cortisol.   She states that her cousin is female and diagnosed with "late onset variety" but pts sx's are not consistent with this.  -I did not  see any evidence of a neurodegenerative disease like PD.  I told her that doesn't mean that this won't develop in the future, but I saw nothing today.  -Will do MRI brain as was told several years ago that had few white spots on MRI when saw Dr. Leta Baptist.  Will do MRI cervical spine as well  given hyperreflexia, but is likely physiologic  -will do B12 and AchRAb's (mild ptosis on L - probable pseudoptosis from lid lag) 2.  Hand paresthesias, R  -sounds most consistent with ulnar neuropathy at the elbow on the right.  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  Certainly at risk for CTS as well.  We will do an EMG. 3.  We will call her as the results come in and follow up as needed.  Total visit time, 60 min with greater than 50% in counseling and coordinating care.

## 2015-10-01 NOTE — Patient Instructions (Signed)
1. We will call you with an appt for your EMG.  2. Your provider has requested that you have labwork completed today. Please go to [facility] on the second floor of this building before leaving the office today. You do not need to check in. If you are not called within 15 minutes please check with the front desk.  3. We have scheduled you at [facility] for your MRI on 10/16/15 at 4:00 pm. Please arrive 15 minutes prior and go to 1st floor radiology. If you need to reschedule for any reason please call [phone].

## 2015-10-02 ENCOUNTER — Telehealth: Payer: Self-pay | Admitting: Neurology

## 2015-10-02 ENCOUNTER — Telehealth: Payer: Self-pay | Admitting: Internal Medicine

## 2015-10-02 NOTE — Telephone Encounter (Signed)
Pt needed a referral to another doctor. Nothing further needed from [place].

## 2015-10-02 NOTE — Telephone Encounter (Signed)
-----   Message from [place], DO sent at 10/02/2015  7:42 AM EST ----- Let pt know that B12 is a little low and to start oral B12 supplement, 1052mcg daily

## 2015-10-02 NOTE — Telephone Encounter (Signed)
Patient made aware.

## 2015-10-10 LAB — MYASTHENIA GRAVIS PANEL 2: Acetylcholine Rec Mod Ab: 17 % binding inhibition

## 2015-10-10 NOTE — Progress Notes (Signed)
HPI: 54 year old female for evaluation of palpitations. Event monitor in 2010 showed sinus to sinus tachycardia. Stress echocardiogram April 2010 normal.  Current Outpatient Prescriptions  Medication Sig Dispense Refill  . estradiol (ESTRACE) 0.5 MG tablet Take 0.5 mg by mouth daily.    . metFORMIN (GLUCOPHAGE-XR) 500 MG 24 hr tablet Take 1 tablet (500 mg total) by mouth daily with breakfast. 90 tablet 3  . Multiple Vitamin (MULTIVITAMIN) tablet Take 1 tablet by mouth daily.     No current facility-administered medications for this visit.    Allergies  Allergen Reactions  . Ivp Dye [Iodinated Diagnostic Agents]   . Niacin Nausea And Vomiting and Rash     Past Medical History  Diagnosis Date  . Palpitations   . HLD (hyperlipidemia)   . GERD (gastroesophageal reflux disease)   . Other specified congenital anomaly of kidney   . NEPHROLITHIASIS 02/22/2009    Qualifier: Diagnosis of  By: Burt Knack CMA, Cecille Rubin    . Transfusion history     placental abruption 1986  . Hx of varicella   . H/O osteopenia   . Endometriosis 1996  . Prediabetes   . Tremor     Past Surgical History  Procedure Laterality Date  . Abdominal surgery      for congenital kidney abnormality  . Nose surgery    . Cesarean section  [id number]  . Abdominal hysterectomy    . Laparoscopic salpingo oopherectomy    . Lithotripsy  1992-1996    multiple    Social History   Social History  . Marital Status: Married    Spouse Name: N/A  . Number of Children: 2  . Years of Education: N/A   Occupational History  . Corporate treasurer    Social History Main Topics  . Smoking status: Never Smoker   . Smokeless tobacco: Never Used  . Alcohol Use: No  . Drug Use: No  . Sexual Activity: Yes    Birth Control/ Protection: Surgical   Other Topics Concern  . Not on file   Social History Narrative   Usually gets 9 hours of sleep per night   4 people living in the home.   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 Cancer Mother     Colon cancer, breast cancer and lung cancer  . Tremor Mother   . Colon cancer Mother   . Arthritis Father   . Vision loss Father     Macular degeneration  . Scoliosis Father   . Asthma Sister   . Asthma Brother   . Osteoporosis Maternal Grandmother   . Other Sister     twin   NAFLD  . Esophageal cancer Neg Hx   . Rectal cancer Neg Hx   . Stomach cancer Neg Hx   . Lung cancer Mother     never smoked or around cig smoke    ROS: Fatigue, weakness and tremor but no fevers or chills, productive cough, hemoptysis, dysphasia, odynophagia, melena, hematochezia, dysuria, hematuria, rash, seizure activity, orthopnea, PND, pedal edema, claudication. Remaining systems are negative.  Physical Exam:   Blood pressure 106/62, pulse 73, height 5\' 3"  (1.6 m), weight 197 lb (89.359 kg).  General:  Well developed/well nourished in NAD Skin warm/dry Patient not depressed  No peripheral clubbing Back-normal HEENT-normal/normal eyelids Neck supple/normal carotid upstroke bilaterally; no bruits; no JVD; no thyromegaly chest - CTA/ normal expansion CV - RRR/normal S1 and S2; no murmurs, rubs or gallops;  PMI nondisplaced Abdomen -NT/ND, no HSM, no mass, + bowel sounds, no bruit 2+ femoral pulses, no bruits Ext-no edema, chords, 2+ DP Neuro-grossly nonfocal  ECG NSR, Normal axis, nonspecific ST changes.

## 2015-10-12 ENCOUNTER — Telehealth: Payer: Self-pay | Admitting: Neurology

## 2015-10-12 ENCOUNTER — Ambulatory Visit (INDEPENDENT_AMBULATORY_CARE_PROVIDER_SITE_OTHER): Payer: BLUE CROSS/BLUE SHIELD | Admitting: Neurology

## 2015-10-12 DIAGNOSIS — G5601 Carpal tunnel syndrome, right upper limb: Secondary | ICD-10-CM

## 2015-10-12 DIAGNOSIS — R292 Abnormal reflex: Secondary | ICD-10-CM

## 2015-10-12 DIAGNOSIS — R202 Paresthesia of skin: Secondary | ICD-10-CM

## 2015-10-12 NOTE — Telephone Encounter (Signed)
-----   Message from [place], DO sent at 10/12/2015  9:55 AM EST ----- Luvenia Starch, please let pt know that she has mild-mod CTS on the right.  If agreeable, send to Dr. Amedeo Plenty with results of EMG

## 2015-10-12 NOTE — Telephone Encounter (Addendum)
Patient made aware of results. Would like referral to Dr Amedeo Plenty. Referral faxed to [phone] with confirmation received. They will call patient with appt.

## 2015-10-12 NOTE — Procedures (Signed)
[facility]  [address], [address]  [place], [place] [address] Tel: [phone] Fax:  [phone] Test Date:  10/12/2015  Patient: Meredith Finley DOB: [birth year] Physician: Narda Amber  Sex: Female Height: 5\' 3"  Ref Phys: Alonza Bogus  ID#: [id number] Temp: 32.3C Technician: Jerilynn Mages. Dean   Patient Complaints: This is a 54 year-old female referred for evaluation of right hand numbness, tingling and weakness.  NCV & EMG Findings: Extensive electrodiagnostic testing of the right upper extremity and additional studies of the left shows: 1. Right median sensory (3.7 ms) and motor (4.2 ms) responses are prolonged with preserved amplitude.  Left median sensory and motor responses are within normal limits. 2. Right ulnar sensory and motor responses are within normal limits. 3. There is no evidence of active or chronic motor axon loss changes affecting any of the tested muscles.  Motor unit recruitment and configuration is within normal limits.  Impression: 1. Right median neuropathy at or distal to the wrist, consistent with the clinical diagnosis of carpal tunnel syndrome.  Overall, these findings are mild-to-moderate in degree electrically.  2. There is no evidence of a cervical radiculopathy or ulnar neuropathy affecting the right upper extremity.   _____________________________ Narda Amber, D.O.    Nerve Conduction Studies Anti Sensory Summary Table   Stim Site NR Peak (ms) Norm Peak (ms) P-T Amp (V) Norm P-T Amp  Left Median Anti Sensory (2nd Digit)  32.3C  Wrist    3.2 <3.6 70.1 >15  Right Median Anti Sensory (2nd Digit)  Wrist    3.7 <3.6 37.5 >15  Right Ulnar Anti Sensory (5th Digit)  Wrist    2.8 <3.1 63.9 >10   Motor Summary Table   Stim Site NR Onset (ms) Norm Onset (ms) O-P Amp (mV) Norm O-P Amp Site1 Site2 Delta-0 (ms) Dist (cm) Vel (m/s) Norm Vel (m/s)  Left Median Motor (Abd Poll Brev)  32.3C  Wrist    3.3 <4.0 7.3 >6 Elbow Wrist 4.0 22.0 55 >50  Elbow     7.3  7.0         Right Median Motor (Abd Poll Brev)  Wrist    4.2 <4.0 7.7 >6 Elbow Wrist 3.9 24.0 62 >50  Elbow    8.1  6.3         Right Ulnar Motor (Abd Dig Minimi)  Wrist    2.6 <3.1 13.2 >7 B Elbow Wrist 3.1 19.0 61 >50  B Elbow    5.7  12.0  A Elbow B Elbow 1.5 10.0 67 >50  A Elbow    7.2  11.9          EMG   Side Muscle Ins Act Fibs Psw Fasc Number Recrt Dur Dur. Amp Amp. Poly Poly. Comment  Right 1stDorInt Nml Nml Nml Nml Nml Nml Nml Nml Nml Nml Nml Nml N/A  Right Abd Poll Brev Nml Nml Nml Nml Nml Nml Nml Nml Nml Nml Nml Nml N/A  Right Ext Indicis Nml Nml Nml Nml Nml Nml Nml Nml Nml Nml Nml Nml N/A  Right PronatorTeres Nml Nml Nml Nml Nml Nml Nml Nml Nml Nml Nml Nml N/A  Right Triceps Nml Nml Nml Nml Nml Nml Nml Nml Nml Nml Nml Nml N/A  Right Biceps Nml Nml Nml Nml Nml Nml Nml Nml Nml Nml Nml Nml N/A  Right Deltoid Nml Nml Nml Nml Nml Nml Nml Nml Nml Nml Nml Nml N/A      Waveforms:

## 2015-10-16 ENCOUNTER — Ambulatory Visit (HOSPITAL_COMMUNITY): Payer: BLUE CROSS/BLUE SHIELD

## 2015-10-16 ENCOUNTER — Ambulatory Visit (HOSPITAL_COMMUNITY): Admission: RE | Admit: 2015-10-16 | Payer: BLUE CROSS/BLUE SHIELD | Source: Ambulatory Visit

## 2015-10-18 ENCOUNTER — Ambulatory Visit (HOSPITAL_COMMUNITY)
Admission: RE | Admit: 2015-10-18 | Discharge: 2015-10-18 | Disposition: A | Discharge status: Home / Self Care | Payer: BLUE CROSS/BLUE SHIELD | Source: Ambulatory Visit | Attending: Neurology | Admitting: Neurology

## 2015-10-18 ENCOUNTER — Encounter: Payer: Self-pay | Admitting: Cardiology

## 2015-10-18 ENCOUNTER — Ambulatory Visit (INDEPENDENT_AMBULATORY_CARE_PROVIDER_SITE_OTHER): Payer: BLUE CROSS/BLUE SHIELD | Admitting: Cardiology

## 2015-10-18 ENCOUNTER — Encounter: Payer: Self-pay | Admitting: *Deleted

## 2015-10-18 ENCOUNTER — Telehealth: Payer: Self-pay | Admitting: Internal Medicine

## 2015-10-18 ENCOUNTER — Ambulatory Visit: Payer: BLUE CROSS/BLUE SHIELD | Admitting: Cardiology

## 2015-10-18 VITALS — BP 106/62 | HR 73 | Ht 63.0 in | Wt 197.0 lb

## 2015-10-18 DIAGNOSIS — M2578 Osteophyte, vertebrae: Secondary | ICD-10-CM | POA: Diagnosis not present

## 2015-10-18 DIAGNOSIS — E7800 Pure hypercholesterolemia, unspecified: Secondary | ICD-10-CM | POA: Diagnosis not present

## 2015-10-18 DIAGNOSIS — M50222 Other cervical disc displacement at C5-C6 level: Secondary | ICD-10-CM | POA: Diagnosis not present

## 2015-10-18 DIAGNOSIS — M4802 Spinal stenosis, cervical region: Secondary | ICD-10-CM | POA: Diagnosis not present

## 2015-10-18 DIAGNOSIS — R0602 Shortness of breath: Secondary | ICD-10-CM | POA: Diagnosis not present

## 2015-10-18 DIAGNOSIS — M50022 Cervical disc disorder at C5-C6 level with myelopathy: Secondary | ICD-10-CM | POA: Diagnosis not present

## 2015-10-18 DIAGNOSIS — R292 Abnormal reflex: Secondary | ICD-10-CM

## 2015-10-18 DIAGNOSIS — R002 Palpitations: Secondary | ICD-10-CM | POA: Diagnosis not present

## 2015-10-18 DIAGNOSIS — R202 Paresthesia of skin: Secondary | ICD-10-CM

## 2015-10-18 DIAGNOSIS — M542 Cervicalgia: Secondary | ICD-10-CM

## 2015-10-18 DIAGNOSIS — G4733 Obstructive sleep apnea (adult) (pediatric): Secondary | ICD-10-CM

## 2015-10-18 DIAGNOSIS — M47892 Other spondylosis, cervical region: Secondary | ICD-10-CM | POA: Insufficient documentation

## 2015-10-18 DIAGNOSIS — Z9989 Dependence on other enabling machines and devices: Secondary | ICD-10-CM

## 2015-10-18 DIAGNOSIS — R251 Tremor, unspecified: Secondary | ICD-10-CM | POA: Insufficient documentation

## 2015-10-18 DIAGNOSIS — G542 Cervical root disorders, not elsewhere classified: Secondary | ICD-10-CM | POA: Diagnosis not present

## 2015-10-18 NOTE — Telephone Encounter (Signed)
Ok to refer for reasons stated but   Records will have to come from  Her previous sleep rpovider.

## 2015-10-18 NOTE — Patient Instructions (Signed)
Medication Instructions:   NO CHANGE  Testing/Procedures:  Your physician has requested that you have an echocardiogram. Echocardiography is a painless test that uses sound waves to create images of your heart. It provides your doctor with information about the size and shape of your heart and how well your heart's chambers and valves are working. This procedure takes approximately one hour. There are no restrictions for this procedure.   Your physician has recommended that you wear an event monitor. Event monitors are medical devices that record the heart's electrical activity. Doctors most often [place] these monitors to diagnose arrhythmias. Arrhythmias are problems with the speed or rhythm of the heartbeat. The monitor is a small, portable device. You can wear one while you do your normal daily activities. This is usually used to diagnose what is causing palpitations/syncope (passing out).    Follow-Up:  Your physician recommends that you schedule a follow-up appointment in: [place]   If you need a refill on your cardiac medications before your next appointment, please call your pharmacy.

## 2015-10-18 NOTE — Telephone Encounter (Signed)
Ms. Szeliga called saying she needs C-Pap supplies and needs a referral in order to get one. She's also in the process of changing Pulmonologist's.  She wants to see Charlaine Dalton ph# [phone] / fax# [phone] [facility]) If you have any questions or concerns, please contact the pt.   Pt's ph# [phone] Thank you.

## 2015-10-18 NOTE — Assessment & Plan Note (Signed)
Plan Event monitor to further assess.

## 2015-10-18 NOTE — Telephone Encounter (Signed)
Ok to refer.

## 2015-10-18 NOTE — Assessment & Plan Note (Signed)
Management per primary care. 

## 2015-10-18 NOTE — Assessment & Plan Note (Signed)
Schedule echocardiogram to assess LV function.

## 2015-10-19 ENCOUNTER — Telehealth: Payer: Self-pay | Admitting: Neurology

## 2015-10-19 NOTE — Telephone Encounter (Signed)
Referral faxed to [facility] at [phone] with confirmation received. They will contact the patient to schedule.  

## 2015-10-19 NOTE — Telephone Encounter (Signed)
Referral placed in the system. 

## 2015-10-19 NOTE — Telephone Encounter (Signed)
-----   Message from [place], DO sent at 10/18/2015  5:18 PM EST ----- Talked to patient as she was leaving MRI.  Gave her results.  I reviewed films.  She was agreeable to neurosx referral.  Luvenia Starch, please send to Dr. Vertell Limber

## 2015-10-22 ENCOUNTER — Ambulatory Visit (INDEPENDENT_AMBULATORY_CARE_PROVIDER_SITE_OTHER): Payer: BLUE CROSS/BLUE SHIELD

## 2015-10-22 DIAGNOSIS — R002 Palpitations: Secondary | ICD-10-CM | POA: Diagnosis not present

## 2015-10-31 ENCOUNTER — Ambulatory Visit (HOSPITAL_COMMUNITY): Payer: BLUE CROSS/BLUE SHIELD | Attending: Cardiology

## 2015-10-31 ENCOUNTER — Other Ambulatory Visit: Payer: Self-pay

## 2015-10-31 DIAGNOSIS — R0602 Shortness of breath: Secondary | ICD-10-CM | POA: Insufficient documentation

## 2015-10-31 DIAGNOSIS — R002 Palpitations: Secondary | ICD-10-CM | POA: Insufficient documentation

## 2015-11-23 ENCOUNTER — Telehealth: Payer: Self-pay | Admitting: *Deleted

## 2015-11-23 MED ORDER — METOPROLOL SUCCINATE ER 25 MG PO TB24: 25.0000 mg | ORAL_TABLET | Freq: Every day | ORAL | Status: DC

## 2015-11-23 NOTE — Telephone Encounter (Signed)
-----   Message from Lelon Perla, MD sent at 11/23/2015  4:58 PM EST ----- Sinus with PAF; Toprol 25 mg daily; schedule fuov Kirk Ruths

## 2015-11-23 NOTE — Telephone Encounter (Signed)
Spoke with pt, Aware of dr Jacalyn Lefevre recommendations.  New script sent to the pharmacy  Follow up scheduled

## 2015-11-26 ENCOUNTER — Telehealth: Payer: Self-pay | Admitting: Cardiology

## 2015-11-26 NOTE — Telephone Encounter (Signed)
Spoke with pt, she is concerned about starting the metoprolol, she is afraid it will make her bp too low. In the past, she has had trouble with low bp. Okay given for pt to take 1/2 tablet at bedtime for about one week to see how she does. She checks her bp at the pharmacy and will let us know if she has any low bp problems. Pt agreed with this plan.

## 2015-11-26 NOTE — Telephone Encounter (Signed)
Pt is calling in wanting to speak with Hilda Blades about her Toprol medication. She is concerned about it lowering her BP. Please f/u with her  Thanks

## 2015-12-14 ENCOUNTER — Ambulatory Visit (INDEPENDENT_AMBULATORY_CARE_PROVIDER_SITE_OTHER): Payer: BLUE CROSS/BLUE SHIELD | Admitting: Cardiology

## 2015-12-14 ENCOUNTER — Encounter: Payer: Self-pay | Admitting: Cardiology

## 2015-12-14 VITALS — BP 112/60 | HR 70 | Ht 63.0 in | Wt 196.0 lb

## 2015-12-14 DIAGNOSIS — Z01810 Encounter for preprocedural cardiovascular examination: Secondary | ICD-10-CM | POA: Insufficient documentation

## 2015-12-14 DIAGNOSIS — I480 Paroxysmal atrial fibrillation: Secondary | ICD-10-CM | POA: Insufficient documentation

## 2015-12-14 NOTE — Patient Instructions (Signed)
Your physician recommends that you schedule a follow-up appointment in: 3 MONTHS WITH DR CRENSHAW  If you need a refill on your cardiac medications before your next appointment, please call your pharmacy.   

## 2015-12-14 NOTE — Assessment & Plan Note (Signed)
Management per primary care. 

## 2015-12-14 NOTE — Assessment & Plan Note (Signed)
Patient has been found to have paroxysmal atrial fibrillationOn her monitor. Plan to continue metoprolol. We can consider an antiarrhythmic in the future if she has frequent symptomatic bouts. CHADSvasc 1 for female sex. We will therefore not anticoagulate at this point. Note she apparently has had a borderline hemoglobin A1c previously. If she develops diabetes mellitus she would need to be anticoagulated.

## 2015-12-14 NOTE — Progress Notes (Signed)
HPI: HPI: FU palpitations. Event monitor in 2010 showed sinus to sinus tachycardia. Stress echocardiogram April 2010 normal. TSH November 2016 normal at 2.04. Echocardiogram December 2016 showed normal LV function, mild left ventricular enlargement and trace mitral/tricuspid regurgitation. Monitor December 2016 showed sinus rhythm with paroxysmal atrial fibrillation. Since last seen, Patient has dyspnea with more extreme activities. No orthopnea, PND, pedal edema, syncope or chest pain.  Current Outpatient Prescriptions  Medication Sig Dispense Refill  . estradiol (ESTRACE) 1 MG tablet Take 1 mg by mouth daily.    . metFORMIN (GLUCOPHAGE-XR) 500 MG 24 hr tablet Take 1 tablet (500 mg total) by mouth daily with breakfast. 90 tablet 3  . metoprolol succinate (TOPROL XL) 25 MG 24 hr tablet Take 1 tablet (25 mg total) by mouth daily. 30 tablet 11  . Multiple Vitamin (MULTIVITAMIN) tablet Take 1 tablet by mouth daily.     No current facility-administered medications for this visit.     Past Medical History  Diagnosis Date  . Palpitations   . HLD (hyperlipidemia)   . GERD (gastroesophageal reflux disease)   . Other specified congenital anomaly of kidney   . NEPHROLITHIASIS 02/22/2009    Qualifier: Diagnosis of  By: Burt Knack CMA, Cecille Rubin    . Transfusion history     placental abruption 1986  . Hx of varicella   . H/O osteopenia   . Endometriosis 1996  . Prediabetes   . Tremor     Past Surgical History  Procedure Laterality Date  . Abdominal surgery      for congenital kidney abnormality  . Nose surgery    . Cesarean section  [id number]  . Abdominal hysterectomy    . Laparoscopic salpingo oopherectomy    . Lithotripsy  1992-1996    multiple    Social History   Social History  . Marital Status: Married    Spouse Name: N/A  . Number of Children: 2  . Years of Education: N/A   Occupational History  . Corporate treasurer    Social History Main Topics  . Smoking status: Never  Smoker   . Smokeless tobacco: Never Used  . Alcohol Use: No  . Drug Use: No  . Sexual Activity: Yes    Birth Control/ Protection: Surgical   Other Topics Concern  . Not on file   Social History Narrative   Usually gets 9 hours of sleep per night   4 people living in the home.   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 Cancer Mother     Colon cancer, breast cancer and lung cancer  . Tremor Mother   . Colon cancer Mother   . Arthritis Father   . Vision loss Father     Macular degeneration  . Scoliosis Father   . Asthma Sister   . Asthma Brother   . Osteoporosis Maternal Grandmother   . Other Sister     twin   NAFLD  . Esophageal cancer Neg Hx   . Rectal cancer Neg Hx   . Stomach cancer Neg Hx   . Lung cancer Mother     never smoked or around cig smoke    ROS: no fevers or chills, productive cough, hemoptysis, dysphasia, odynophagia, melena, hematochezia, dysuria, hematuria, rash, seizure  activity, orthopnea, PND, pedal edema, claudication. Remaining systems are negative.  Physical Exam: Well-developed obese in no acute distress.  Skin is warm and dry.  HEENT is normal.  Neck is supple.  Chest is clear to auscultation with normal expansion.  Cardiovascular exam is regular rate and rhythm.  Abdominal exam nontender or distended. No masses palpated. Extremities show no edema. neuro grossly intact

## 2015-12-14 NOTE — Assessment & Plan Note (Signed)
Patient will require neck surgery in the near future.She has good functional capacity with no chest pain. She can complete 4 mets with no symptoms. Would not pursue further ischemia evaluation preoperatively. Would continue metoprolol pre-and postoperatively. If she develops postoperative atrial fibrillation we will be available to treat.

## 2015-12-24 ENCOUNTER — Ambulatory Visit: Payer: BLUE CROSS/BLUE SHIELD | Admitting: Cardiology

## 2016-03-12 NOTE — Progress Notes (Signed)
HPI: FU PAF. Event monitor in 2010 showed sinus to sinus tachycardia. Stress echocardiogram April 2010 normal. TSH November 2016 normal. Echocardiogram December 2016 showed normal LV systolic function. Monitor December 2016 showed sinus rhythm with paroxysmal atrial fibrillation. Since last seen, She denies dyspnea, chest pain or syncope. She occasionally has brief palpitations when stressed.  Current Outpatient Prescriptions  Medication Sig Dispense Refill  . estradiol (ESTRACE) 1 MG tablet Take 1 mg by mouth daily.    . metFORMIN (GLUCOPHAGE-XR) 500 MG 24 hr tablet Take 1 tablet (500 mg total) by mouth daily with breakfast. 90 tablet 3  . metoprolol succinate (TOPROL XL) 25 MG 24 hr tablet Take 1 tablet (25 mg total) by mouth daily. 30 tablet 11  . Multiple Vitamin (MULTIVITAMIN) tablet Take 1 tablet by mouth daily.     No current facility-administered medications for this visit.     Past Medical History  Diagnosis Date  . Palpitations   . HLD (hyperlipidemia)   . GERD (gastroesophageal reflux disease)   . Other specified congenital anomaly of kidney   . NEPHROLITHIASIS 02/22/2009    Qualifier: Diagnosis of  By: Burt Knack CMA, Cecille Rubin    . Transfusion history     placental abruption 1986  . Hx of varicella   . H/O osteopenia   . Endometriosis 1996  . Prediabetes   . Tremor     Past Surgical History  Procedure Laterality Date  . Abdominal surgery      for congenital kidney abnormality  . Nose surgery    . Cesarean section  [id number]  . Abdominal hysterectomy    . Laparoscopic salpingo oopherectomy    . Lithotripsy  1992-1996    multiple    Social History   Social History  . Marital Status: Married    Spouse Name: N/A  . Number of Children: 2  . Years of Education: N/A   Occupational History  . Corporate treasurer    Social History Main Topics  . Smoking status: Never Smoker   . Smokeless tobacco: Never Used  . Alcohol Use: No  . Drug Use: No  . Sexual  Activity: Yes    Birth Control/ Protection: Surgical   Other Topics Concern  . Not on file   Social History Narrative   Usually gets 9 hours of sleep per night   4 people living in the home.   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 Cancer Mother     Colon cancer, breast cancer and lung cancer  . Tremor Mother   . Colon cancer Mother   . Arthritis Father   . Vision loss Father     Macular degeneration  . Scoliosis Father   . Asthma Sister   . Asthma Brother   . Osteoporosis Maternal Grandmother   . Other Sister     twin   NAFLD  . Esophageal cancer Neg Hx   . Rectal cancer Neg Hx   . Stomach cancer Neg Hx   . Lung cancer Mother     never smoked or around cig smoke    ROS: no fevers or chills, productive cough, hemoptysis, dysphasia, odynophagia, melena, hematochezia, dysuria, hematuria, rash, seizure activity, orthopnea, PND, pedal edema, claudication. Remaining systems are negative.  Physical  Exam: Well-developed obese in no acute distress.  Skin is warm and dry.  HEENT is normal.  Neck is supple.  Chest is clear to auscultation with normal expansion.  Cardiovascular exam is regular rate and rhythm.  Abdominal exam nontender or distended. No masses palpated. Extremities show no edema. neuro grossly intact  ECG  Sinus rhythm at a rate of 64. Nonspecific ST changes.

## 2016-03-17 ENCOUNTER — Encounter: Payer: Self-pay | Admitting: Cardiology

## 2016-03-17 ENCOUNTER — Ambulatory Visit (INDEPENDENT_AMBULATORY_CARE_PROVIDER_SITE_OTHER): Payer: BLUE CROSS/BLUE SHIELD | Admitting: Cardiology

## 2016-03-17 VITALS — BP 90/62 | HR 64 | Ht 63.0 in | Wt 194.0 lb

## 2016-03-17 DIAGNOSIS — I480 Paroxysmal atrial fibrillation: Secondary | ICD-10-CM

## 2016-03-17 DIAGNOSIS — E7800 Pure hypercholesterolemia, unspecified: Secondary | ICD-10-CM

## 2016-03-17 MED ORDER — APIXABAN 5 MG PO TABS: 5.0000 mg | ORAL_TABLET | Freq: Two times a day (BID) | ORAL | Status: DC

## 2016-03-17 NOTE — Assessment & Plan Note (Addendum)
Patient with h/o PAF. Plan to continue metoprolol. We can consider an antiarrhythmic in the future if she has frequent symptomatic bouts. CHADSvasc 2 for female sex and probable DM-Patient states she had gestational diabetes mellitus and she is on Glucophage for high glucose and hemoglobin A1c; I therefore think diabetes should be included as a risk factor. Begin apixaban 5 BID. Check hemoglobin and renal function.

## 2016-03-17 NOTE — Patient Instructions (Signed)
Medication Instructions:   START ELIQUIS 5 MG ONE TABLET TWICE DAILY  Labwork:  Your physician recommends that you return for lab work in: [place]:  Your physician wants you to follow-up in: [place] will receive a reminder letter in the mail two months in advance. If you don't receive a letter, please call our office to schedule the follow-up appointment.   If you need a refill on your cardiac medications before your next appointment, please call your pharmacy.

## 2016-03-17 NOTE — Assessment & Plan Note (Signed)
Management per primary care. 

## 2016-06-13 ENCOUNTER — Telehealth: Payer: Self-pay | Admitting: Cardiology

## 2016-06-13 NOTE — Telephone Encounter (Signed)
New Message:     Please call,she has a rash.She thinks it might be coming from Eliquis.

## 2016-06-13 NOTE — Telephone Encounter (Signed)
Spoke with pt, she started the eliquis one week ago and has noticed a rash. She was also exposed to poison ivy and has a rash from it as well. The rash is mainly localized to her abdomen under her breast but is then scattered across her body. She says it looks like a blister. She is going to stop the eliquis for one week and then restart to see if the rash comes back. Patient voiced understanding she is at risk being off the eliquis.

## 2016-06-16 ENCOUNTER — Telehealth: Payer: Self-pay | Admitting: Cardiology

## 2016-06-16 NOTE — Telephone Encounter (Signed)
New message       Pt c/o medication issue:  1. Name of Medication: eliquis 2. How are you currently taking this medication (dosage and times per day)? 5mg  3. Are you having a reaction (difficulty breathing--STAT)? Yes---rash 4. What is your medication issue? Pt stopped medication last wed because of a rash.  She states her rash is showing up in new locations including her face.  The rash pt had last week is worse.  Please advise

## 2016-06-16 NOTE — Telephone Encounter (Signed)
Returned call to patient.She stated she stopped Eliquis last Wed 06/11/16 due to a red blistery rash all over.Stated rash getting worse no better.Stated eyes lids starting to swell.Advised I will send message to our pharmacist for advice.Advised if eye lids continue to swell she needs to go to a Urgent Care.

## 2016-06-17 NOTE — Telephone Encounter (Signed)
If her last dose of Eliquis was last Wednesday, rash should have started to fade within a couple of days.  If it has gotten worse, could be caused by something other than Eliquis.

## 2016-06-18 ENCOUNTER — Ambulatory Visit: Payer: Self-pay | Admitting: Adult Health

## 2016-06-18 ENCOUNTER — Encounter: Payer: Self-pay | Admitting: Adult Health

## 2016-06-18 ENCOUNTER — Telehealth: Payer: Self-pay | Admitting: Internal Medicine

## 2016-06-18 ENCOUNTER — Ambulatory Visit (INDEPENDENT_AMBULATORY_CARE_PROVIDER_SITE_OTHER): Payer: BLUE CROSS/BLUE SHIELD | Admitting: Adult Health

## 2016-06-18 VITALS — BP 112/62 | Temp 98.2°F | Ht 63.0 in | Wt 194.6 lb

## 2016-06-18 DIAGNOSIS — R21 Rash and other nonspecific skin eruption: Secondary | ICD-10-CM | POA: Diagnosis not present

## 2016-06-18 MED ORDER — METHYLPREDNISOLONE ACETATE 80 MG/ML IJ SUSP
120.0000 mg | Freq: Once | INTRAMUSCULAR | Status: AC
  Administered 2016-06-18: 120 mg via INTRAMUSCULAR

## 2016-06-18 NOTE — Telephone Encounter (Signed)
F/u (see telephone notes from 8/4 and 8/7): per chart patient was seen today by PCP for rash possibly related to Eliquis.  Per OV note with [name], NP:  1. Rash and nonspecific skin eruption - Possibly from Eliquis.  - Does not appear as contact dermatitis  - methylPREDNISolone acetate (DEPO-MEDROL) injection 120 mg; Inject 1.5 mLs (120 mg total) into the muscle once. - Follow up if no improvement or go to the ER with any difficulty breathing [name], NP  Will route to MD and clinical pharmacist to see if medication changes are needed at this point.

## 2016-06-18 NOTE — Telephone Encounter (Signed)
Appointment scheduled with Tommi Rumps at 3:15 today

## 2016-06-18 NOTE — Progress Notes (Signed)
Subjective:    Patient ID: Meredith Finley, female    DOB: 28-Dec-1960, 55 y.o.   MRN: [id number]  HPI  55 year old female who presents to the office today for the acute complaint of rash. She reports that about two weeks ago she was started on Eliquis for A fib. A week after starting the medication she developed a red itchy rash throughout the body. She contacted Dr. Jacalyn Lefevre office and was told to stop Eliquis. A week later she continues to have a rash with new eruptions throughout her body. The rash is mostly located on her face, bilateral arms, chest and abdomen.   She has been applying a topical cortisone cream which has helped slightly.   Denies any change in diet, new detergents or soaps.   She has been in her yard working  Review of Systems  Constitutional: Negative.   HENT: Negative.   Eyes: Negative.   Respiratory: Positive for shortness of breath.   Cardiovascular: Negative.   Gastrointestinal: Negative.   Genitourinary: Negative.   Neurological: Negative.   All other systems reviewed and are negative.  Past Medical History:  Diagnosis Date  . Endometriosis 1996  . GERD (gastroesophageal reflux disease)   . H/O osteopenia   . HLD (hyperlipidemia)   . Hx of varicella   . NEPHROLITHIASIS 02/22/2009   Qualifier: Diagnosis of  By: Burt Knack CMA, Cecille Rubin    . Other specified congenital anomaly of kidney   . Palpitations   . Prediabetes   . Transfusion history    placental abruption 1986  . Tremor     Social History   Social History  . Marital status: Married    Spouse name: N/A  . Number of children: 2  . Years of education: N/A   Occupational History  . Education officer, community   Social History Main Topics  . Smoking status: Never Smoker  . Smokeless tobacco: Never Used  . Alcohol use No  . Drug use: No  . Sexual activity: Yes    Birth control/ protection: Surgical   Other Topics Concern  . Not on file   Social History Narrative   Usually gets 9  hours of sleep per night   4 people living in the home.   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 ABDOMINAL HYSTERECTOMY    . ABDOMINAL SURGERY     for congenital kidney abnormality  . CESAREAN SECTION  [id number]  . LAPAROSCOPIC SALPINGO OOPHERECTOMY    . LITHOTRIPSY  1992-1996   multiple  . NOSE SURGERY      Family History  Problem Relation Age of Onset  . Cancer Mother     Colon cancer, breast cancer and lung cancer  . Tremor Mother   . Colon cancer Mother   . Arthritis Father   . Vision loss Father     Macular degeneration  . Scoliosis Father   . Asthma Sister   . Asthma Brother   . Osteoporosis Maternal Grandmother   . Other Sister     twin   NAFLD  . Esophageal cancer Neg Hx   . Rectal cancer Neg Hx   . Stomach cancer Neg Hx   . Lung cancer Mother     never smoked or around cig  smoke    Allergies  Allergen Reactions  . Ivp Dye [Iodinated Diagnostic Agents]   . Niacin Nausea And Vomiting and Rash    Current Outpatient Prescriptions on File Prior to Visit  Medication Sig Dispense Refill  . apixaban (ELIQUIS) 5 MG TABS tablet Take 1 tablet (5 mg total) by mouth 2 (two) times daily. 60 tablet 6  . estradiol (ESTRACE) 1 MG tablet Take 1 mg by mouth daily.    . metFORMIN (GLUCOPHAGE-XR) 500 MG 24 hr tablet Take 1 tablet (500 mg total) by mouth daily with breakfast. 90 tablet 3  . metoprolol succinate (TOPROL XL) 25 MG 24 hr tablet Take 1 tablet (25 mg total) by mouth daily. 30 tablet 11  . Multiple Vitamin (MULTIVITAMIN) tablet Take 1 tablet by mouth daily.     No current facility-administered medications on file prior to visit.     BP 112/62   Temp 98.2 F (36.8 C) (Oral)   Ht 5\' 3"  (1.6 m)   Wt 194 lb 9.6 oz (88.3 kg)   BMI 34.47 kg/m       Objective:   Physical Exam  Constitutional: She is oriented to person, place, and  time. She appears well-developed and well-nourished. No distress.  HENT:  Head: Normocephalic and atraumatic.  Right Ear: External ear normal.  Left Ear: External ear normal.  Nose: Nose normal.  Mouth/Throat: Oropharynx is clear and moist. No oropharyngeal exudate.  Eyes: Conjunctivae and EOM are normal. Pupils are equal, round, and reactive to light. Right eye exhibits no discharge.  Neck: Normal range of motion. Neck supple. No thyromegaly present.  Cardiovascular: Normal rate, regular rhythm, normal heart sounds and intact distal pulses.  Exam reveals no gallop.   No murmur heard. Pulmonary/Chest: Effort normal and breath sounds normal. No respiratory distress. She has no wheezes. She has no rales. She exhibits no tenderness.  Lymphadenopathy:    She has no cervical adenopathy.  Neurological: She is alert and oriented to person, place, and time.  Skin: Skin is warm and dry. Rash noted. She is not diaphoretic. No erythema. No pallor.  Red papular rash located throughout her body. Located mostly around breasts and face. No vesicles or lesions. No drainage or crusting  Psychiatric: She has a normal mood and affect. Her behavior is normal. Judgment and thought content normal.  Nursing note and vitals reviewed.     Assessment & Plan:  1. Rash and nonspecific skin eruption - Possibly from Eliquis.  - Does not appear as contact dermatitis  - methylPREDNISolone acetate (DEPO-MEDROL) injection 120 mg; Inject 1.5 mLs (120 mg total) into the muscle once. - Follow up if no improvement or go to the ER with any difficulty breathing  [name], NP

## 2016-06-18 NOTE — Telephone Encounter (Signed)
Will route to Cory as FYI. 

## 2016-06-18 NOTE — Telephone Encounter (Signed)
[place] [facility] - Client [place] Call [facility] Patient Name: [facility] DOB: 1960/12/19 Initial Comment Caller states she has had a rash for a week. Started a new medication a week before that. Stopped that medication a week ago, but still breaking out in rash in new areas. Nurse Assessment Nurse: Dimas Chyle, RN, Dellis Filbert Date/Time Eilene Ghazi Time): 06/18/2016 11:10:20 AM Confirm and document reason for call. If symptomatic, describe symptoms. You must click the next button to save text entered. ---Caller states she has had a rash for a week. Started a new medication a week before that. Stopped that medication a week ago, but still breaking out in rash in new areas. Previously on Eliquis. Rash has blisters. Has the patient traveled out of the country within the last 30 days? ---No Does the patient have any new or worsening symptoms? ---Yes Will a triage be completed? ---Yes Related visit to physician within the last 2 weeks? ---No Does the PT have any chronic conditions? (i.e. diabetes, asthma, etc.) ---Yes List chronic conditions. ---Afib, hypotension Is the patient pregnant or possibly pregnant? (Ask all females between the ages of 17-55) ---No Is this a behavioral health or substance abuse call? ---No Guidelines Guideline Title Affirmed Question Affirmed Notes Rash - Widespread On Drugs Large or small blisters on skin (i.e., fluid filled bubbles or sacs) Final Disposition User See Physician within 4 Hours (or PCP triage) Dimas Chyle, RN, Dellis Filbert Referrals REFERRED TO PCP OFFICE Disagree/Comply: Leta Baptist

## 2016-06-19 NOTE — Telephone Encounter (Signed)
Seems unlikely related to apixaban given now one week later with persistence of rash; when rash improves would try xarelto 20 mg daily and check bmet and cbc 4 weeks after that Kirk Ruths

## 2016-06-19 NOTE — Telephone Encounter (Signed)
Since we are uncertain if this is possibly related to the Eliquis would recommend switch to Xarelto 20mg  daily. At this point though I would wait to start until the rash has cleared. She does have a low CHADSVASC but would make her aware of risks of being off medication.

## 2016-06-19 NOTE — Telephone Encounter (Signed)
Spoke with pt, she continues to have a rash and is aware at this point it is not related to the eliquis. She will call once rash resolved for xarelto script to be called into the pharmacy.

## 2016-06-24 ENCOUNTER — Telehealth: Payer: Self-pay | Admitting: Internal Medicine

## 2016-06-24 NOTE — Telephone Encounter (Signed)
°  Patient Name: [facility]  DOB: 1961/06/08    Initial Comment Caller states has a rash all over. Was seen and rec'd a steroid shot, but the rash still has not gone away.    Nurse Assessment  Nurse: Julien Girt, RN, Almyra Free Date/Time Eilene Ghazi Time): 06/24/2016 3:33:49 PM  Confirm and document reason for call. If symptomatic, describe symptoms. You must click the next button to save text entered. ---Caller states she has had a rash for over a week. She was seen on 06/18/16, given a steroid shot, stopped all her meds on the 10th to r/o reactions but the rash will not go away. States it continues to break out in new areas on her face, trunk and arms. Denies fever or pain, just itching. The new areas are not coming up as bad as before, some areas are healing.  Has the patient traveled out of the country within the last 30 days? ---Not Applicable  Does the patient have any new or worsening symptoms? ---Yes  Will a triage be completed? ---Yes  Related visit to physician within the last 2 weeks? ---Yes  Does the PT have any chronic conditions? (i.e. diabetes, asthma, etc.) ---Yes  List chronic conditions. ---R/O food allergies, Afib, hypotension  Is the patient pregnant or possibly pregnant? (Ask all females between the ages of 76-55) ---No  Is this a behavioral health or substance abuse call? ---No     Guidelines    Guideline Title Affirmed Question Affirmed Notes  Rash or Redness - Widespread Rash looks like large or small blisters (i.e., fluid filled bubbles or sacs on the skin)    Final Disposition User   Go to ED Now (or PCP triage) Julien Girt, RN, Sharma Covert refuses ED at this time. I was unable to find an appointment for today so she is asking if she can come tomorrow.   Referrals  GO TO FACILITY REFUSED   Disagree/Comply: Disagree  Disagree/Comply Reason: Disagree with instructions   Called office to notify of ED refusal, but did not receive answer.

## 2016-06-25 ENCOUNTER — Encounter: Payer: Self-pay | Admitting: Internal Medicine

## 2016-06-25 ENCOUNTER — Ambulatory Visit (INDEPENDENT_AMBULATORY_CARE_PROVIDER_SITE_OTHER): Payer: BLUE CROSS/BLUE SHIELD | Admitting: Internal Medicine

## 2016-06-25 VITALS — BP 116/72 | Temp 98.0°F | Wt 192.9 lb

## 2016-06-25 DIAGNOSIS — R21 Rash and other nonspecific skin eruption: Secondary | ICD-10-CM

## 2016-06-25 MED ORDER — FLUOCINONIDE-E 0.05 % EX CREA: 1.0000 "application " | TOPICAL_CREAM | Freq: Two times a day (BID) | CUTANEOUS | 0 refills | Status: DC

## 2016-06-25 NOTE — Telephone Encounter (Signed)
Please Advise  Pt scheduled For 3:30 with Dr.Panosh

## 2016-06-25 NOTE — Progress Notes (Signed)
Pre visit review using our clinic review tool, if applicable. No additional management support is needed unless otherwise documented below in the visit note.  Chief Complaint  Patient presents with  . Rash    Has broke out in new areas of her body.  Continues to itch    HPI: Meredith Finley 55 y.o.  Sen last week by Cn  And rx see below   Onset itchy rash intertrigenous breast  Area other  Better gut in mid abd and lower back now  sais had left retro aruriculatr area  . ? Other members of family with rash after eating a certain fish  When at a function.  husband no rash  Has remained off all meds since then ? If reaction to  meds ?   Trying benadrykl when  Itchy and 2.5% hcs  Red papular rash located throughout her body. Located mostly around breasts and face. No vesicles or lesions. No drainage or crusting  Psychiatric: She has a normal mood and affect. Her behavior is normal. Judgment and thought content normal.  Nursing note and vitals reviewed.     Assessment & Plan:  1. Rash and nonspecific skin eruption - Possibly from Eliquis.  - Does not appear as contact dermatitis  - methylPREDNISolone acetate (DEPO-MEDROL) injection 120 mg; Inject 1.5 mLs (120 mg total) into the muscle once. - Follow up if no improvement or go to the ER with any difficulty breathing  [name], NP  ROS: See pertinent positives and negatives per HPI. No v d resp sx  Put on  Low dose eliquis for borderline risk   Past Medical History:  Diagnosis Date  . Endometriosis 1996  . GERD (gastroesophageal reflux disease)   . H/O osteopenia   . HLD (hyperlipidemia)   . Hx of varicella   . NEPHROLITHIASIS 02/22/2009   Qualifier: Diagnosis of  By: Burt Knack CMA, Cecille Rubin    . Other specified congenital anomaly of kidney   . Palpitations   . Prediabetes   . Transfusion history    placental abruption 1986  . Tremor     Family History  Problem Relation Age of Onset  . Cancer Mother     Colon cancer, breast  cancer and lung cancer  . Tremor Mother   . Colon cancer Mother   . Arthritis Father   . Vision loss Father     Macular degeneration  . Scoliosis Father   . Asthma Sister   . Asthma Brother   . Osteoporosis Maternal Grandmother   . Other Sister     twin   NAFLD  . Esophageal cancer Neg Hx   . Rectal cancer Neg Hx   . Stomach cancer Neg Hx   . Lung cancer Mother     never smoked or around cig smoke    Social History   Social History  . Marital status: Married    Spouse name: N/A  . Number of children: 2  . Years of education: N/A   Occupational History  . Education officer, community   Social History Main Topics  . Smoking status: Never Smoker  . Smokeless tobacco: Never Used  . Alcohol use No  . Drug use: No  . Sexual activity: Yes    Birth control/ protection: Surgical   Other Topics Concern  . None   Social History Narrative   Usually gets 9 hours of sleep per night   4 people living in the home.   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 apixaban (ELIQUIS) 5 MG TABS tablet Take 1 tablet (5 mg total) by mouth 2 (two) times daily. 60 tablet 6  . estradiol (ESTRACE) 1 MG tablet Take 1 mg by mouth daily.    . metFORMIN (GLUCOPHAGE-XR) 500 MG 24 hr tablet Take 1 tablet (500 mg total) by mouth daily with breakfast. 90 tablet 3  . metoprolol succinate (TOPROL XL) 25 MG 24 hr tablet Take 1 tablet (25 mg total) by mouth daily. 30 tablet 11  . Multiple Vitamin (MULTIVITAMIN) tablet Take 1 tablet by mouth daily.     No facility-administered medications prior to visit.      EXAM:  BP 116/72 (BP Location: Left Arm, Patient Position: Sitting, Cuff Size: Normal)   Temp 98 F (36.7 C) (Oral)   Wt 192 lb 14.4 oz (87.5 kg)   BMI 34.17 kg/m   Body mass index is 34.17 kg/m.  GENERAL: vitals reviewed and listed above,  alert, oriented, appears well hydrated and in no acute distress HEENT: atraumatic, conjunctiva  clear, no obvious abnormalities on inspection of external nose and ears OP : no lesion edema or exudate  NECK: no obvious masses on inspection palpation  MS: moves all extremities without noticeable focal  abnormality PSYCH: pleasant and cooperative, no obvious depression or anxiety Skin rash papular anterior chest  And upper arms  Antecubital  Flexeril confluent scaly left more than right   Rash  fadings rash intermammary  Scattered on ant abdomen  Left arm with linear rash at scratch line   Face and neck clear .      ASSESSMENT AND PLAN:  Discussed the following assessment and plan:  Rash and nonspecific skin eruption - atypical off all meds steroid inj minimal help antecubital rough ? if koebner  poss reverse psoariasis?   - Plan: Ambulatory referral to Dermatology  didn't see the rash last week .... No pix   Card thought could be from eliquis so that stopped     Atypica min mod improvement  from DepoMedrol .Marland Kitchen Trial lidex leg arm Distribution atypical for drug rash .  Plan see dermatologist  Referral for  Help with dx .  For now stay off the meds  Until better .  -Patient advised to return or notify health care team  if symptoms worsen ,persist or new concerns arise.   Expectant management. Doubt if from the fish  And concern about mercury toxicity but  See if family have pix of their rash  Patient Instructions  Uncertain cause of rash   Agree with stopping other meds  Stronger topical to the left arm rash  And will do derm referral .  ? If reverse psoriasis or other  .   Does not  look like infection or  Alarming in other ways. Cool compresses   To avoid scratching .     [name]. Jolisa Intriago M.D.

## 2016-06-25 NOTE — Patient Instructions (Addendum)
Uncertain cause of rash   Agree with stopping other meds  Stronger topical to the left arm rash  And will do derm referral .  ? If reverse psoriasis or other  .   Does not  look like infection or  Alarming in other ways. Cool compresses   To avoid scratching .

## 2016-07-18 ENCOUNTER — Ambulatory Visit (INDEPENDENT_AMBULATORY_CARE_PROVIDER_SITE_OTHER): Payer: BLUE CROSS/BLUE SHIELD | Admitting: Internal Medicine

## 2016-07-18 ENCOUNTER — Encounter: Payer: Self-pay | Admitting: Internal Medicine

## 2016-07-18 ENCOUNTER — Telehealth: Payer: Self-pay | Admitting: Internal Medicine

## 2016-07-18 VITALS — BP 104/72 | Temp 97.8°F | Wt 191.7 lb

## 2016-07-18 DIAGNOSIS — R252 Cramp and spasm: Secondary | ICD-10-CM

## 2016-07-18 DIAGNOSIS — I4891 Unspecified atrial fibrillation: Secondary | ICD-10-CM | POA: Diagnosis not present

## 2016-07-18 DIAGNOSIS — R739 Hyperglycemia, unspecified: Secondary | ICD-10-CM | POA: Diagnosis not present

## 2016-07-18 LAB — CBC WITH DIFFERENTIAL/PLATELET
BASOS ABS: 0 {cells}/uL (ref 0–200)
BASOS PCT: 0 %
EOS PCT: 2 %
Eosinophils Absolute: 208 cells/uL (ref 15–500)
HCT: 41.8 % (ref 35.0–45.0)
Hemoglobin: 14 g/dL (ref 11.7–15.5)
LYMPHS PCT: 27 %
Lymphs Abs: 2808 cells/uL (ref 850–3900)
MCH: 30.9 pg (ref 27.0–33.0)
MCHC: 33.5 g/dL (ref 32.0–36.0)
MCV: 92.3 fL (ref 80.0–100.0)
MONOS PCT: 6 %
MPV: 11.2 fL (ref 7.5–12.5)
Monocytes Absolute: 624 cells/uL (ref 200–950)
NEUTROS ABS: 6760 {cells}/uL (ref 1500–7800)
Neutrophils Relative %: 65 %
PLATELETS: 315 10*3/uL (ref 140–400)
RBC: 4.53 MIL/uL (ref 3.80–5.10)
RDW: 13.4 % (ref 11.0–15.0)
WBC: 10.4 10*3/uL (ref 3.8–10.8)

## 2016-07-18 LAB — T4, FREE: Free T4: 1.3 ng/dL (ref 0.8–1.8)

## 2016-07-18 LAB — SEDIMENTATION RATE: SED RATE: 16 mm/h (ref 0–30)

## 2016-07-18 LAB — TSH: TSH: 1.24 mIU/L

## 2016-07-18 NOTE — Patient Instructions (Signed)
  Will notify you  of labs when available.  Glad you rash is better   Please get back with cardiology about the palpitations and   Use of anticoagulant  .    Muscle Cramps and Spasms Muscle cramps and spasms occur when a muscle or muscles tighten and you have no control over this tightening (involuntary muscle contraction). They are a common problem and can develop in any muscle. The most common place is in the calf muscles of the leg. Both muscle cramps and muscle spasms are involuntary muscle contractions, but they also have differences:   Muscle cramps are sporadic and painful. They may last a few seconds to a quarter of an hour. Muscle cramps are often more forceful and last longer than muscle spasms.  Muscle spasms may or may not be painful. They may also last just a few seconds or much longer. CAUSES  It is uncommon for cramps or spasms to be due to a serious underlying problem. In many cases, the cause of cramps or spasms is unknown. Some common causes are:   Overexertion.   Overuse from repetitive motions (doing the same thing over and over).   Remaining in a certain position for a long period of time.   Improper preparation, form, or technique while performing a sport or activity.   Dehydration.   Injury.   Side effects of some medicines.   Abnormally low levels of the salts and ions in your blood (electrolytes), especially potassium and calcium. This could happen if you are taking water pills (diuretics) or you are pregnant.  Some underlying medical problems can make it more likely to develop cramps or spasms. These include, but are not limited to:   Diabetes.   Parkinson disease.   Hormone disorders, such as thyroid problems.   Alcohol abuse.   Diseases specific to muscles, joints, and bones.   Blood vessel disease where not enough blood is getting to the muscles.  HOME CARE INSTRUCTIONS   Stay well hydrated. Drink enough water and fluids to keep  your urine clear or pale yellow.  It may be helpful to massage, stretch, and relax the affected muscle.  For tight or tense muscles, use a warm towel, heating pad, or hot shower water directed to the affected area.  If you are sore or have pain after a cramp or spasm, applying ice to the affected area may relieve discomfort.  Put ice in a plastic bag.  Place a towel between your skin and the bag.  Leave the ice on for 15-20 minutes, 03-04 times a day.  Medicines used to treat a known cause of cramps or spasms may help reduce their frequency or severity. Only take over-the-counter or prescription medicines as directed by your caregiver. SEEK MEDICAL CARE IF:  Your cramps or spasms get more severe, more frequent, or do not improve over time.  MAKE SURE YOU:   Understand these instructions.  Will watch your condition.  Will get help right away if you are not doing well or get worse.   This information is not intended to replace advice given to you by your health care provider. Make sure you discuss any questions you have with your health care provider.   Document Released: 04/18/2002 Document Revised: 02/21/2013 Document Reviewed: 10/13/2012 Elsevier Interactive Patient Education [employer].

## 2016-07-18 NOTE — Telephone Encounter (Signed)
[place] [facility] - Client [place] [facility]  Patient Name: Meredith Finley  DOB: 01/04/1961    Initial Comment Caller states she has been rx meds for body rash, now having muscle cramping from knees down into feet.   Nurse Assessment  Nurse: Wynetta Emery, RN, Baker Janus Date/Time Eilene Ghazi Time): 07/18/2016 9:58:11 AM  Confirm and document reason for call. If symptomatic, describe symptoms. You must click the next button to save text entered. ---[name] had rash on body given and steroid injection and topical medication after the steroid injection did not work; having heart palpations, and severe charlie horses or muscle spasms in lower legs and into her back. worried about potassium deficiency. also having fatigue with this.  Has the patient traveled out of the country within the last 30 days? ---No  Does the patient have any new or worsening symptoms? ---Yes  Will a triage be completed? ---Yes  Related visit to physician within the last 2 weeks? ---Yes  Does the PT have any chronic conditions? (i.e. diabetes, asthma, etc.) ---Unknown  Is the patient pregnant or possibly pregnant? (Ask all females between the ages of 27-55) ---No  Is this a behavioral health or substance abuse call? ---No     Guidelines    Guideline Title Affirmed Question Affirmed Notes  Leg Pain [1] SEVERE pain (e.g., excruciating, unable to do any normal activities) AND [2] not improved after 2 hours of pain medicine    Final Disposition User   See Physician within 4 Hours (or PCP triage) Wynetta Emery, RN, Baker Janus    Comments  NOTE: appt scheduled 07-18-2016 245pm arrival with 300pm appt time w/ Dr. Shanon Ace r/o low potassium   Referrals  REFERRED TO PCP OFFICE   Disagree/Comply: Comply

## 2016-07-18 NOTE — Progress Notes (Signed)
Pre visit review using our clinic review tool, if applicable. No additional management support is needed unless otherwise documented below in the visit note.  Chief Complaint  Patient presents with  . Bilateral Leg Cramps    X5Days  . Back Muscle Cramps    HPI: Meredith Finley 55 y.o.  sda  Complete about one week history of severe muscle cramps toe contraction symmetrical lower extremities when it happens and lasts about 30 seconds but is painful and difficult. She also will get acute spasms in her lower back bilaterally. No midline tenderness or sciatica related. She's also noticed more atrial fib feeling with palpitations in her chest. She has gone back on the metoprolol but hasn't started the Eliquis again. She is taking some calcium and magnesium in case the levels are low. She has been on significant prednisone. Her rash is getting better avoiding dairy and is much better. She did have episode of sharp chest pain when she was lying down at night midline lower was no associated symptoms. Has a history of reflux disease. She is here with her husband today. ROS: See pertinent positives and negatives per HPI.  Past Medical History:  Diagnosis Date  . Endometriosis 1996  . GERD (gastroesophageal reflux disease)   . H/O osteopenia   . HLD (hyperlipidemia)   . Hx of varicella   . NEPHROLITHIASIS 02/22/2009   Qualifier: Diagnosis of  By: Burt Knack CMA, Cecille Rubin    . Other specified congenital anomaly of kidney   . Palpitations   . Prediabetes   . Transfusion history    placental abruption 1986  . Tremor     Family History  Problem Relation Age of Onset  . Cancer Mother     Colon cancer, breast cancer and lung cancer  . Tremor Mother   . Colon cancer Mother   . Arthritis Father   . Vision loss Father     Macular degeneration  . Scoliosis Father   . Asthma Sister   . Asthma Brother   . Osteoporosis Maternal Grandmother   . Other Sister     twin   NAFLD  . Esophageal cancer Neg Hx     . Rectal cancer Neg Hx   . Stomach cancer Neg Hx   . Lung cancer Mother     never smoked or around cig smoke    Social History   Social History  . Marital status: Married    Spouse name: N/A  . Number of children: 2  . Years of education: N/A   Occupational History  . Education officer, community   Social History Main Topics  . Smoking status: Never Smoker  . Smokeless tobacco: Never Used  . Alcohol use No  . Drug use: No  . Sexual activity: Yes    Birth control/ protection: Surgical   Other Topics Concern  . None   Social History Narrative   Usually gets 9 hours of sleep per night   4 people living in the home.   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 estradiol (ESTRACE) 1 MG tablet Take 1 mg by mouth daily.    . metoprolol succinate (TOPROL XL) 25 MG 24 hr tablet Take 1 tablet (25 mg total) by mouth daily. [place]  tablet 11  . Multiple Vitamin (MULTIVITAMIN) tablet Take 1 tablet by mouth daily.    Marland Kitchen apixaban (ELIQUIS) 5 MG TABS tablet Take 1 tablet (5 mg total) by mouth 2 (two) times daily. (Patient not taking: Reported on 07/18/2016) 60 tablet 6  . metFORMIN (GLUCOPHAGE-XR) 500 MG 24 hr tablet Take 1 tablet (500 mg total) by mouth daily with breakfast. (Patient not taking: Reported on 07/18/2016) 90 tablet 3  . fluocinonide-emollient (LIDEX-E) 0.05 % cream Apply 1 application topically 2 (two) times daily. 30 g 0   No facility-administered medications prior to visit.      EXAM:  BP 104/72   Temp 97.8 F (36.6 C) (Oral)   Wt 191 lb 11.2 oz (87 kg)   BMI 33.96 kg/m   Body mass index is 33.96 kg/m.  GENERAL: vitals reviewed and listed above, alert, oriented, appears well hydrated and in no acute distressNontoxic appears well. HEENT: atraumatic, conjunctiva  clear, no obvious abnormalities on inspection of external  nose and ears OP : no lesion edema or exudate  NECK: no obvious masses on inspection palpation  LUNGS: clear to auscultation bilaterally, no wheezes, rales or rhonchi, good air movement CV: HRRR, no clubbing cyanosis or  peripheral edema nl cap refill  Abdomen:  Sof,t normal bowel sounds without hepatosplenomegaly, no guarding rebound or masses no CVA tenderness Neuro cranial nerves appear intact no fasciculations normal strength and range of motion or clonus. MS: moves all extremities without noticeable focal  Abnormality gait nl  PSYCH: pleasant and cooperative, no obvious depression or anxiety Skin  Faded rash  justa bout clear  ASSESSMENT AND PLAN:  Discussed the following assessment and plan:  Cramps, muscle, general - Plan: Hemoglobin A1c, TSH, T4, Free, Magnesium, CK, Sedimentation Rate, CBC with Differential/Platelet, CANCELED: Basic metabolic panel, CANCELED: Hemoglobin A1c, CANCELED: TSH, CANCELED: T4, free, CANCELED: Magnesium, CANCELED: Hepatic function panel, CANCELED: CK, CANCELED: Sedimentation rate, CANCELED: CBC with Differential/Platelet  Hyperglycemia - Plan: Basic metabolic panel, Hepatic function panel, Basic metabolic panel, CANCELED: Basic metabolic panel, CANCELED: Hemoglobin A1c, CANCELED: TSH, CANCELED: T4, free, CANCELED: Magnesium, CANCELED: Hepatic function panel, CANCELED: CK, CANCELED: Sedimentation rate, CANCELED: CBC with Differential/Platelet  Atrial fibrillation, unspecified type ([place]) - Plan: CANCELED: Basic metabolic panel, CANCELED: Hemoglobin A1c, CANCELED: TSH, CANCELED: T4, free, CANCELED: Magnesium, CANCELED: Hepatic function panel, CANCELED: CK, CANCELED: Sedimentation rate, CANCELED: CBC with Differential/Platelet Rule out metabolic causes her lower extremity cramps sound very severe and almost a tonic. Not associated with hyperventilation or specific medicine. Should check with cardiology about the paroxysmal atrial fibrillation getting back on her  anticoagulation. I don't think the rash is from the medicine at this time. Cramps certainly could be related to  Steroid? treatment. -Patient advised to return or notify health care team  if symptoms worsen ,persist or new concerns arise.  Patient Instructions   Will notify you  of labs when available.  Glad you rash is better   Please get back with cardiology about the palpitations and   Use of anticoagulant  .    Muscle Cramps and Spasms Muscle cramps and spasms occur when a muscle or muscles tighten and you have no control over this tightening (involuntary muscle contraction). They are a common problem and can develop in any muscle. The most common place is in the calf muscles of the leg. Both muscle cramps and muscle spasms are involuntary muscle contractions, but they also have differences:   Muscle cramps are sporadic and painful. They may last a few  seconds to a quarter of an hour. Muscle cramps are often more forceful and last longer than muscle spasms.  Muscle spasms may or may not be painful. They may also last just a few seconds or much longer. CAUSES  It is uncommon for cramps or spasms to be due to a serious underlying problem. In many cases, the cause of cramps or spasms is unknown. Some common causes are:   Overexertion.   Overuse from repetitive motions (doing the same thing over and over).   Remaining in a certain position for a long period of time.   Improper preparation, form, or technique while performing a sport or activity.   Dehydration.   Injury.   Side effects of some medicines.   Abnormally low levels of the salts and ions in your blood (electrolytes), especially potassium and calcium. This could happen if you are taking water pills (diuretics) or you are pregnant.  Some underlying medical problems can make it more likely to develop cramps or spasms. These include, but are not limited to:   Diabetes.   Parkinson disease.   Hormone disorders,  such as thyroid problems.   Alcohol abuse.   Diseases specific to muscles, joints, and bones.   Blood vessel disease where not enough blood is getting to the muscles.  HOME CARE INSTRUCTIONS   Stay well hydrated. Drink enough water and fluids to keep your urine clear or pale yellow.  It may be helpful to massage, stretch, and relax the affected muscle.  For tight or tense muscles, use a warm towel, heating pad, or hot shower water directed to the affected area.  If you are sore or have pain after a cramp or spasm, applying ice to the affected area may relieve discomfort.  Put ice in a plastic bag.  Place a towel between your skin and the bag.  Leave the ice on for 15-20 minutes, 03-04 times a day.  Medicines used to treat a known cause of cramps or spasms may help reduce their frequency or severity. Only take over-the-counter or prescription medicines as directed by your caregiver. SEEK MEDICAL CARE IF:  Your cramps or spasms get more severe, more frequent, or do not improve over time.  MAKE SURE YOU:   Understand these instructions.  Will watch your condition.  Will get help right away if you are not doing well or get worse.   This information is not intended to replace advice given to you by your health care provider. Make sure you discuss any questions you have with your health care provider.   Document Released: 04/18/2002 Document Revised: 02/21/2013 Document Reviewed: 10/13/2012 Elsevier Interactive Patient Education 2016 [place] K. Ilianna Bown M.D.

## 2016-07-19 LAB — HEPATIC FUNCTION PANEL
ALBUMIN: 3.7 g/dL (ref 3.6–5.1)
ALK PHOS: 104 U/L (ref 33–130)
ALT: 17 U/L (ref 6–29)
AST: 15 U/L (ref 10–35)
BILIRUBIN INDIRECT: 0.4 mg/dL (ref 0.2–1.2)
Bilirubin, Direct: 0.1 mg/dL (ref <=0.2)
Total Bilirubin: 0.5 mg/dL (ref 0.2–1.2)
Total Protein: 7.2 g/dL (ref 6.1–8.1)

## 2016-07-19 LAB — BASIC METABOLIC PANEL
BUN: 14 mg/dL (ref 7–25)
CALCIUM: 9.4 mg/dL (ref 8.6–10.4)
CO2: 26 mmol/L (ref 20–31)
CREATININE: 0.72 mg/dL (ref 0.50–1.05)
Chloride: 102 mmol/L (ref 98–110)
Glucose, Bld: 78 mg/dL (ref 65–99)
Potassium: 4.1 mmol/L (ref 3.5–5.3)
SODIUM: 141 mmol/L (ref 135–146)

## 2016-07-19 LAB — HEMOGLOBIN A1C
HEMOGLOBIN A1C: 5.6 % (ref <5.7)
Mean Plasma Glucose: 114 mg/dL

## 2016-07-19 LAB — MAGNESIUM: Magnesium: 2 mg/dL (ref 1.5–2.5)

## 2016-07-19 LAB — CK: CK TOTAL: 29 U/L (ref 7–177)

## 2016-08-01 ENCOUNTER — Ambulatory Visit (INDEPENDENT_AMBULATORY_CARE_PROVIDER_SITE_OTHER): Payer: BLUE CROSS/BLUE SHIELD | Admitting: Physician Assistant

## 2016-08-01 ENCOUNTER — Encounter: Payer: Self-pay | Admitting: Physician Assistant

## 2016-08-01 VITALS — BP 112/68 | HR 64 | Ht 63.0 in | Wt 192.2 lb

## 2016-08-01 DIAGNOSIS — I480 Paroxysmal atrial fibrillation: Secondary | ICD-10-CM | POA: Diagnosis not present

## 2016-08-01 DIAGNOSIS — R7303 Prediabetes: Secondary | ICD-10-CM

## 2016-08-01 DIAGNOSIS — E785 Hyperlipidemia, unspecified: Secondary | ICD-10-CM | POA: Diagnosis not present

## 2016-08-01 MED ORDER — DILTIAZEM HCL ER COATED BEADS 120 MG PO CP24: 120.0000 mg | ORAL_CAPSULE | Freq: Every day | ORAL | 3 refills | Status: DC

## 2016-08-01 MED ORDER — APIXABAN 5 MG PO TABS: 5.0000 mg | ORAL_TABLET | Freq: Two times a day (BID) | ORAL | 6 refills | Status: DC

## 2016-08-01 NOTE — Patient Instructions (Signed)
Medication Instructions:   START DILTIAZEM CD 120 MG ONCE DAILY  RESTART ELIQUIS 5 MG TWICE DAILY  Follow-Up:  Your physician recommends that you schedule a follow-up appointment in: [place]

## 2016-08-01 NOTE — Progress Notes (Signed)
Cardiology Office Note    Date:  08/01/2016   ID:  JONQUIL PIERE, DOB 03/23/1961, MRN [id number]  PCP:  Lottie Dawson, MD  Cardiologist:  Dr. Stanford Breed  Chief Complaint  Patient presents with  . Follow-up    seen for Dr. Stanford Breed, palpitation    History of Present Illness:  Meredith Finley is a 55 y.o. female with past medical history of hyperlipidemia, GERD, history of hypertension, history of PAF, history of placental abruption in 1996 require transfusion, endometriosis and prediabetes. She had an event monitor placed in 2010 that showed a sinus tachycardia. Stress echocardiogram obtained in April 2010 was normal. TSH in November 2016 was normal. Echocardiogram in December 2016 showed normal LV systolic function. She had another monitor in December 2016 that showed sinus rhythm with paroxysmal atrial fibrillation. It does not appear she has been placed on systemic anticoagulation therapy. Per Dr. Stanford Breed, her PAF has been managed by metoprolol, we could consider antiarrhythmic therapy in the future if she has frequent symptomatic bouts. He was started on Paxil about 5 mg twice a day in May 2017. Her CHA2DS2-Vasc score is 2 (female and DM). She did call back in August with a rash, it was felt eliquis was on likely related to the rash, she was switched to Xarelto 20 mg daily but she appears to have never started on this. She was seen by family physician on 06/18/2016, she was treated with methylprednisolone injection. She continued to have the rash on follow-up, therefore she was referred to dermatology. She was last seen by her PCP on 07/18/2016 for severe muscle cramps, she also noticed more atrial fibrillation feeling with palpitation in her chest.   According to the patient, she eventually figured out there was actually the Toprol-XL that was causing the rash. She said she stopped eliquis, however the rash was still going on, so she took her off of metoprolol and her rash resolved. She later  tried to reinitiate metoprolol and her rash came back. She is willing to have it another try at eliquis. Although it is very unusual for her to have rash on metoprolol, I do agree that he she should give eliquis another chance. I have also offered her Xarelto, however she wished to try eliquis first. Since she came off of metoprolol, her fatigue has also resolved. I will avoid beta blocker at this point, we have also discussed alternative options, it appears she only have recurrence of palpitation once every several weeks. Given the rare frequency of the symptom, I think a minimal rate control medication is all she will need. I started her on 120 mg diltiazem CD daily. I have asked her to monitor her blood pressure on daily basis. She has follow-up appointment with dermatology office. She will see Dr. Stanford Breed in 3 month for reassessment of her tachycardia palpitation episode. Otherwise she denies any chest pain, there is no heart failure symptoms, no lower extremity edema, orthopnea or PND. We have also went over recent labs, her hemoglobin A1c is borderline normal at 5.6, I have encouraged diet and exercise.    Past Medical History:  Diagnosis Date  . Endometriosis 1996  . GERD (gastroesophageal reflux disease)   . H/O osteopenia   . HLD (hyperlipidemia)   . Hx of varicella   . NEPHROLITHIASIS 02/22/2009   Qualifier: Diagnosis of  By: Burt Knack CMA, Cecille Rubin    . Other specified congenital anomaly of kidney   . Palpitations   . Prediabetes   .  Transfusion history    placental abruption 1986  . Tremor     Past Surgical History:  Procedure Laterality Date  . ABDOMINAL HYSTERECTOMY    . ABDOMINAL SURGERY     for congenital kidney abnormality  . CESAREAN SECTION  [id number]  . LAPAROSCOPIC SALPINGO OOPHERECTOMY    . LITHOTRIPSY  1992-1996   multiple  . NOSE SURGERY      Current Medications: Outpatient Medications Prior to Visit  Medication Sig Dispense Refill  . Calcium Carb-Cholecalciferol  (CALTRATE 600+D) 600-800 MG-UNIT TABS Take 1 tablet by mouth daily.    Marland Kitchen estradiol (ESTRACE) 1 MG tablet Take 1 mg by mouth daily.    . Magnesium 250 MG TABS Take by mouth.    . Multiple Vitamin (MULTIVITAMIN) tablet Take 1 tablet by mouth daily.    Marland Kitchen apixaban (ELIQUIS) 5 MG TABS tablet Take 1 tablet (5 mg total) by mouth 2 (two) times daily. (Patient not taking: Reported on 07/18/2016) 60 tablet 6  . metFORMIN (GLUCOPHAGE-XR) 500 MG 24 hr tablet Take 1 tablet (500 mg total) by mouth daily with breakfast. (Patient not taking: Reported on 07/18/2016) 90 tablet 3  . metoprolol succinate (TOPROL XL) 25 MG 24 hr tablet Take 1 tablet (25 mg total) by mouth daily. 30 tablet 11   No facility-administered medications prior to visit.      Allergies:   Ivp dye [iodinated diagnostic agents]; Metoprolol; and Niacin   Social History   Social History  . Marital status: Married    Spouse name: N/A  . Number of children: 2  . Years of education: N/A   Occupational History  . Education officer, community   Social History Main Topics  . Smoking status: Never Smoker  . Smokeless tobacco: Never Used  . Alcohol use No  . Drug use: No  . Sexual activity: Yes    Birth control/ protection: Surgical   Other Topics Concern  . None   Social History Narrative   Usually gets 9 hours of sleep per night   4 people living in the home.   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   ROS:   Please see the history of present illness.    ROS All other systems reviewed and are negative.   PHYSICAL EXAM:   VS:  BP  112/68   Pulse 64   Ht 5\' 3"  (1.6 m)   Wt 192 lb 3.2 oz (87.2 kg)   BMI 34.05 kg/m    GEN: Well nourished, well developed, in no acute distress  HEENT: normal  Neck: no JVD, carotid bruits, or masses Cardiac: RRR; no murmurs, rubs, or gallops,no edema  Respiratory:  clear to auscultation bilaterally, normal work of breathing GI: soft, nontender, nondistended, + BS MS: no deformity or atrophy  Skin: warm and dry, no rash Neuro:  Alert and Oriented x 3, Strength and sensation are intact Psych: euthymic mood, full affect  Wt Readings from Last 3 Encounters:  08/01/16 192 lb 3.2 oz (87.2  kg)  07/18/16 191 lb 11.2 oz (87 kg)  06/25/16 192 lb 14.4 oz (87.5 kg)      Studies/Labs Reviewed:   EKG:  EKG is ordered today.  The ekg ordered today demonstrates Normal sinus rhythm without significant ST-T wave changes.  Recent Labs: 07/18/2016: ALT 17; BUN 14; Creat 0.72; Hemoglobin 14.0; Magnesium 2.0; Platelets 315; Potassium 4.1; Sodium 141; TSH 1.24   Lipid Panel    Component Value Date/Time   CHOL 199 09/25/2015 0826   TRIG 352.0 (H) 09/25/2015 0826   HDL 38.00 (L) 09/25/2015 0826   CHOLHDL 5 09/25/2015 0826   VLDL 70.4 (H) 09/25/2015 0826   LDLDIRECT 121.0 09/25/2015 0826    Additional studies/ records that were reviewed today include:    Echo 10/31/2015 LV EF: 55% -   60%  ------------------------------------------------------------------- Indications:      Palpitations (R00.2).  SOB (R06.02).  ------------------------------------------------------------------- History:   PMH:  Acquired from the patient and from the patient&'s chart.  Palpitations and dyspnea.  Risk factors:  Obese. Dyslipidemia.  ------------------------------------------------------------------- Study Conclusions  - Left ventricle: The cavity size was mildly dilated. Wall   thickness was normal. Systolic function was normal. The estimated   ejection fraction was in the range of 55% to 60%.  Wall motion was   normal; there were no regional wall motion abnormalities. Left   ventricular diastolic function parameters were normal. - Pericardium, extracardiac: A trivial pericardial effusion was   identified.  Impressions:  - Normal LV function; mild LVE; trace MR and TR.   ASSESSMENT:    1. Paroxysmal atrial fibrillation (HCC)   2. Hyperlipidemia   3. Prediabetes      PLAN:  In order of problems listed above:  1. PAF: She was started on eliquis this year, however self discontinued later due to appearance of rash. But appears she later attributed to rash to the Toprol-XL. She is willing to try eliquis again, we will restart it today. I will also give her 120 mg daily of diltiazem CD. She has only rare occurrence of palpitation episodes, only once every few weeks. I do not believe she needs any antiarrhythmic medication at this time. I think she will respond very well to rate control only.  - note, her hemoglobin A1c recently was 5.6, looking back in her chart it appears she was given one point for being female and another 0.4 prediabetes, therefore her CHA2DS2-Vasc score is 2. However recent hemoglobin A1c was 5.6, I will deferred to Dr. Stanford Breed on duration of systemic anticoagulation therapy. I will restart it at this point as it appears she is having recurrence of atrial fibrillation based on her symptom.  2. Hypertriglyceridemia: Despite carrying a diagnosis of hyperlipidemia, she is not on any statin medication. Her last lipid test in November 2016 showed triglyceride 352, HDL 38, total cholesterol 199. She is due for repeat on follow-up, if it is still high she will need either fenofibrate and fish oil or statin medication.  3. Prediabetes: It appears her hemoglobin A1c is improved to 5.6 which is upper border of normal. I have encouraged diet and exercise.    Medication Adjustments/Labs and Tests Ordered: Current medicines are reviewed at length with the patient today.   Concerns regarding medicines are outlined above.  Medication changes, Labs and Tests ordered today are listed in the Patient Instructions below. Patient Instructions  Medication Instructions:   START DILTIAZEM CD 120 MG ONCE DAILY  RESTART ELIQUIS 5 MG TWICE DAILY  Follow-Up:  Your physician recommends that you schedule a follow-up appointment in: 3 MONTHS WITH DR Demetrius Charity, PA  08/01/2016 11:01 PM    [facility] [address], [place], [place]  [address] Phone: [phone]; Fax: [phone]

## 2016-08-21 ENCOUNTER — Other Ambulatory Visit (INDEPENDENT_AMBULATORY_CARE_PROVIDER_SITE_OTHER): Payer: BLUE CROSS/BLUE SHIELD

## 2016-08-21 DIAGNOSIS — Z0000 Encounter for general adult medical examination without abnormal findings: Secondary | ICD-10-CM | POA: Diagnosis not present

## 2016-08-21 LAB — BASIC METABOLIC PANEL
BUN: 12 mg/dL (ref 6–23)
CO2: 28 mEq/L (ref 19–32)
Calcium: 8.9 mg/dL (ref 8.4–10.5)
Chloride: 103 mEq/L (ref 96–112)
Creatinine, Ser: 0.62 mg/dL (ref 0.40–1.20)
GFR: 106.72 mL/min (ref >60.00)
Glucose, Bld: 101 mg/dL — ABNORMAL HIGH (ref 70–99)
POTASSIUM: 4.1 meq/L (ref 3.5–5.1)
SODIUM: 138 meq/L (ref 135–145)

## 2016-08-21 LAB — CBC WITH DIFFERENTIAL/PLATELET
Basophils Absolute: 0 10*3/uL (ref 0.0–0.1)
Basophils Relative: 0.5 % (ref 0.0–3.0)
EOS PCT: 2.4 % (ref 0.0–5.0)
Eosinophils Absolute: 0.2 10*3/uL (ref 0.0–0.7)
HEMATOCRIT: 38.7 % (ref 36.0–46.0)
HEMOGLOBIN: 13.3 g/dL (ref 12.0–15.0)
LYMPHS PCT: 37.3 % (ref 12.0–46.0)
Lymphs Abs: 3.4 10*3/uL (ref 0.7–4.0)
MCHC: 34.3 g/dL (ref 30.0–36.0)
MCV: 92.3 fl (ref 78.0–100.0)
MONO ABS: 0.6 10*3/uL (ref 0.1–1.0)
Monocytes Relative: 6.7 % (ref 3.0–12.0)
Neutro Abs: 4.8 10*3/uL (ref 1.4–7.7)
Neutrophils Relative %: 53.1 % (ref 43.0–77.0)
Platelets: 266 10*3/uL (ref 150.0–400.0)
RBC: 4.2 Mil/uL (ref 3.87–5.11)
RDW: 13.3 % (ref 11.5–15.5)
WBC: 9 10*3/uL (ref 4.0–10.5)

## 2016-08-21 LAB — LIPID PANEL
Cholesterol: 205 mg/dL — ABNORMAL HIGH (ref 0–200)
HDL: 38.2 mg/dL — ABNORMAL LOW (ref >39.00)
Total CHOL/HDL Ratio: 5
Triglycerides: 464 mg/dL — ABNORMAL HIGH (ref 0.0–149.0)

## 2016-08-21 LAB — HEPATIC FUNCTION PANEL
ALT: 26 U/L (ref 0–35)
AST: 24 U/L (ref 0–37)
Albumin: 3.6 g/dL (ref 3.5–5.2)
Alkaline Phosphatase: 104 U/L (ref 39–117)
BILIRUBIN DIRECT: 0.1 mg/dL (ref 0.0–0.3)
BILIRUBIN TOTAL: 0.4 mg/dL (ref 0.2–1.2)
Total Protein: 6.7 g/dL (ref 6.0–8.3)

## 2016-08-21 LAB — TSH: TSH: 1.46 u[IU]/mL (ref 0.35–4.50)

## 2016-08-21 LAB — LDL CHOLESTEROL, DIRECT: LDL DIRECT: 103 mg/dL

## 2016-08-28 NOTE — Progress Notes (Signed)
Chief Complaint  Patient presents with  . Annual Exam    HPI: Patient  Meredith Finley  55 y.o. comes in today for [place] visit   c spine c4-5 and cts surgery   Dr Vertell Limber doing better done march last year Rash better after steroids hasnt seem derm yet ? Reverse psoraiatis  Rash is gone after steroids Stress  caretaking  elederly parents  Eating less healthy with this  Working long peridos trying to get back to healthier status Has ? About labs etc  Has gyne   Health Maintenance  Topic Date Due  . HIV Screening  11/25/1977  . TETANUS/TDAP  11/25/1981  . MAMMOGRAM  05/10/2013  . PAP SMEAR  05/18/2016  . INFLUENZA VACCINE  06/10/2016  . COLONOSCOPY  04/11/2018  . Hepatitis C Screening  Completed   Health Maintenance Review LIFESTYLE:  Exercise:  Not alot Tobacco/ETS:n Alcohol: n Sugar beverages:n Sleep:ok 7+ Drug use: no  Work: many hours husband with occpital neuralgia  And  moim with early dementia    ROS:  GEN/ HEENT: No fever, significant weight changes sweats headaches vision problems hearing changes, CV/ PULM; No chest pain shortness of breath cough, syncope,edema  change in exercise tolerance. GI /GU: No adominal pain, vomiting, change in bowel habits. No blood in the stool. No significant GU symptoms. SKIN/HEME: ,no acute skin rashes suspicious lesions or bleeding. No lymphadenopathy, nodules, masses.  NEURO/ PSYCH:  No neurologic signs such as weakness numbness. No depression anxiety. IMM/ Allergy: No unusual infections.  Allergy .   REST of 12 system review negative except as per HPI   Past Medical History:  Diagnosis Date  . Endometriosis 1996  . GERD (gastroesophageal reflux disease)   . H/O osteopenia   . HLD (hyperlipidemia)   . Hx of varicella   . NEPHROLITHIASIS 02/22/2009   Qualifier: Diagnosis of  By: Burt Knack CMA, Cecille Rubin    . Other specified congenital anomaly of kidney   . Palpitations   . Prediabetes   . Transfusion history    placental abruption 1986  . Tremor     Past Surgical History:  Procedure Laterality Date  . ABDOMINAL HYSTERECTOMY    . ABDOMINAL SURGERY     for congenital kidney abnormality  . CARPAL TUNNEL RELEASE    . CESAREAN SECTION  [id number]  . LAPAROSCOPIC SALPINGO OOPHERECTOMY    . LITHOTRIPSY  1992-1996   multiple  . NOSE SURGERY    . surgical fusion disk 5& 6      Family History  Problem Relation Age of Onset  . Cancer Mother     Colon cancer, breast cancer and lung cancer  . Tremor Mother   . Colon cancer Mother   . Lung cancer Mother     never smoked or around cig smoke  . Arthritis Father   . Vision loss Father     Macular degeneration  . Scoliosis Father   . Hashimoto's thyroiditis Daughter   . Asthma Sister   . Asthma Brother   . Osteoporosis Maternal Grandmother   . Other Sister     twin   NAFLD  . Esophageal cancer Neg Hx   . Rectal cancer Neg Hx   . Stomach cancer Neg Hx     Social History   Social History  . Marital status: Married    Spouse name: N/A  . Number of children: 2  . Years of education: N/A   Occupational History  . Graphic  Neurosurgeon   Social History Main Topics  . Smoking status: Never Smoker  . Smokeless tobacco: Never Used  . Alcohol use No  . Drug use: No  . Sexual activity: Yes    Birth control/ protection: Surgical   Other Topics Concern  . None   Social History Narrative   Usually gets 9 hours of sleep per night   4 people living in the home.   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 apixaban (ELIQUIS) 5 MG TABS tablet Take 1 tablet (5 mg total) by mouth 2 (two) times daily. 60 tablet 6  . Calcium Carb-Cholecalciferol (CALTRATE 600+D) 600-800 MG-UNIT TABS Take 1 tablet by mouth daily.    Marland Kitchen estradiol (ESTRACE) 1 MG tablet Take 1 mg by mouth daily.    . Magnesium  250 MG TABS Take by mouth.    . Multiple Vitamin (MULTIVITAMIN) tablet Take 1 tablet by mouth daily.    Marland Kitchen diltiazem (CARDIZEM CD) 120 MG 24 hr capsule Take 1 capsule (120 mg total) by mouth daily. (Patient not taking: Reported on 08/29/2016) 90 capsule 3   No facility-administered medications prior to visit.      EXAM:  BP 110/60 (BP Location: Right Arm, Patient Position: Sitting, Cuff Size: Normal)   Pulse 86   Temp 97.7 F (36.5 C) (Oral)   Wt 198 lb 12.8 oz (90.2 kg)   SpO2 96%   BMI 35.22 kg/m   Body mass index is 35.22 kg/m.  Physical Exam: Vital signs reviewed [id number] is a well-developed well-nourished alert cooperative    who appearsr stated age in no acute distress.  HEENT: normocephalic atraumatic , Eyes: PERRL EOM's full, conjunctiva clear, Nares: paten,t no deformity discharge or tenderness., Ears: no deformity EAC's clear TMs with normal landmarks. Mouth: clear OP, no lesions, edema.  Moist mucous membranes. Dentition in adequate repair. NECK: supple without masses, thyromegaly or bruits. CHEST/PULM:  Clear to auscultation and percussion breath sounds equal no wheeze , rales or rhonchi. No chest wall deformities or tenderness.Breast: normal by inspection . No dimpling, discharge, masses, tenderness or discharge . CV: PMI is nondisplaced, S1 S2 no gallops, murmurs, rubs. Peripheral pulses are full without delay.No JVD .  ABDOMEN: Bowel sounds normal nontender  No guard or rebound, no hepato splenomegal no CVA tenderness.  No hernia. Extremtities:  No clubbing cyanosis or edema, no acute joint swelling or redness no focal atrophy NEURO:  Oriented x3, cranial nerves 3-12 appear to be intact, no obvious focal weakness,gait within normal limits no abnormal reflexes or asymmetrical well healed scar right hand  CTS SKIN: No acute rashes normal turgor, color, no bruising or petechiae. PSYCH: Oriented, good eye contact, no obvious depression anxiety, cognition and judgment appear  normal. LN: no cervical axillary inguinal adenopathy  Lab Results  Component Value Date   WBC 9.0 08/21/2016   HGB 13.3 08/21/2016   HCT 38.7 08/21/2016   PLT 266.0 08/21/2016   GLUCOSE 101 (H) 08/21/2016   CHOL 205 (H) 08/21/2016   TRIG (H) 08/21/2016    464.0 Triglyceride is over 400; calculations on Lipids are invalid.   HDL 38.20 (L) 08/21/2016   LDLDIRECT 103.0 08/21/2016   ALT 26 08/21/2016   AST 24 08/21/2016   NA 138  08/21/2016   K 4.1 08/21/2016   CL 103 08/21/2016   CREATININE 0.62 08/21/2016   BUN 12 08/21/2016   CO2 28 08/21/2016   TSH 1.46 08/21/2016   HGBA1C 5.6 07/18/2016    ASSESSMENT AND PLAN:  Discussed the following assessment and plan:  Visit for preventive health examination  Other hyperlipidemia  Hypertriglyceridemia  Fasting hyperglycemia  Influenza vaccination declined by patient Hasn't started her anticoagulation back but her medications have been changed. Uncertain cause of her rash that still can follow-up with dermatology. She hasn't is metabolic syndrome with high triglycerides and low HDL borderline blood sugar may be secondary to recent steroids but needs to be followed. Options discussed strategies. Plan repeat lipid panel with an A1c in about 4-6 months of lifestyle intervention. There are medications that can be helpful but did not have robust outcome studies. Lifestyle weight loss will be more important. Suggest tracking to be able to eat healthier and lose weight and time to self. HCM reveiwed Patient Care Team: Burnis Medin, MD as PCP - General (Internal Medicine) Dian Queen, MD as Attending Physician (Obstetrics and Gynecology) Lelon Perla, MD as Consulting Physician (Cardiology) Patient Instructions  Intensify lifestyle interventions.  Tracking  As we discussed    Time away from stress.   Recheck fasting lipid and hg a1c in 4-6 months with or without OV.   Health Maintenance, Female Adopting a healthy lifestyle and  getting preventive care can go a long way to promote health and wellness. Talk with your health care provider about what schedule of regular examinations is right for you. This is a good chance for you to check in with your provider about disease prevention and staying healthy. In between checkups, there are plenty of things you can do on your own. Experts have done a lot of research about which lifestyle changes and preventive measures are most likely to keep you healthy. Ask your health care provider for more information. WEIGHT AND DIET  Eat a healthy diet  Be sure to include plenty of vegetables, fruits, low-fat dairy products, and lean protein.  Do not eat a lot of foods high in solid fats, added sugars, or salt.  Get regular exercise. This is one of the most important things you can do for your health.  Most adults should exercise for at least 150 minutes each week. The exercise should increase your heart rate and make you sweat (moderate-intensity exercise).  Most adults should also do strengthening exercises at least twice a week. This is in addition to the moderate-intensity exercise.  Maintain a healthy weight  Body mass index (BMI) is a measurement that can be used to identify possible weight problems. It estimates body fat based on height and weight. Your health care provider can help determine your BMI and help you achieve or maintain a healthy weight.  For females 32 years of age and older:   A BMI below 18.5 is considered underweight.  A BMI of 18.5 to 24.9 is normal.  A BMI of 25 to 29.9 is considered overweight.  A BMI of 30 and above is considered obese.  Watch levels of cholesterol and blood lipids  You should start having your blood tested for lipids and cholesterol at 53 years of age, then have this test every 5 years.  You may need to have your cholesterol levels checked more often if:  Your lipid or cholesterol levels are high.  You are older than 53 years  of age.  You are at high risk for heart disease.  CANCER SCREENING   Lung Cancer  Lung cancer screening is recommended for adults 44-31 years old who are at high risk for lung cancer because of a history of smoking.  A yearly low-dose CT scan of the lungs is recommended for people who:  Currently smoke.  Have quit within the past 15 years.  Have at least a 30-pack-year history of smoking. A pack year is smoking an average of one pack of cigarettes a day for 1 year.  Yearly screening should continue until it has been 15 years since you quit.  Yearly screening should stop if you develop a health problem that would prevent you from having lung cancer treatment.  Breast Cancer  Practice breast self-awareness. This means understanding how your breasts normally appear and feel.  It also means doing regular breast self-exams. Let your health care provider know about any changes, no matter how small.  If you are in your 20s or 30s, you should have a clinical breast exam (CBE) by a health care provider every 1-3 years as part of a regular health exam.  If you are 42 or older, have a CBE every year. Also consider having a breast X-ray (mammogram) every year.  If you have a family history of breast cancer, talk to your health care provider about genetic screening.  If you are at high risk for breast cancer, talk to your health care provider about having an MRI and a mammogram every year.  Breast cancer gene (BRCA) assessment is recommended for women who have family members with BRCA-related cancers. BRCA-related cancers include:  Breast.  Ovarian.  Tubal.  Peritoneal cancers.  Results of the assessment will determine the need for genetic counseling and BRCA1 and BRCA2 testing. Cervical Cancer Your health care provider may recommend that you be screened regularly for cancer of the pelvic organs (ovaries, uterus, and vagina). This screening involves a pelvic examination, including  checking for microscopic changes to the surface of your cervix (Pap test). You may be encouraged to have this screening done every 3 years, beginning at age 41.  For women ages 10-65, health care providers may recommend pelvic exams and Pap testing every 3 years, or they may recommend the Pap and pelvic exam, combined with testing for human papilloma virus (HPV), every 5 years. Some types of HPV increase your risk of cervical cancer. Testing for HPV may also be done on women of any age with unclear Pap test results.  Other health care providers may not recommend any screening for nonpregnant women who are considered low risk for pelvic cancer and who do not have symptoms. Ask your health care provider if a screening pelvic exam is right for you.  If you have had past treatment for cervical cancer or a condition that could lead to cancer, you need Pap tests and screening for cancer for at least 20 years after your treatment. If Pap tests have been discontinued, your risk factors (such as having a new sexual partner) need to be reassessed to determine if screening should resume. Some women have medical problems that increase the chance of getting cervical cancer. In these cases, your health care provider may recommend more frequent screening and Pap tests. Colorectal Cancer  This type of cancer can be detected and often prevented.  Routine colorectal cancer screening usually begins at 53 years of age and continues through 53 years of age.  Your health care provider may recommend screening at  an earlier age if you have risk factors for colon cancer.  Your health care provider may also recommend using home test kits to check for hidden blood in the stool.  A small camera at the end of a tube can be used to examine your colon directly (sigmoidoscopy or colonoscopy). This is done to check for the earliest forms of colorectal cancer.  Routine screening usually begins at age 104.  Direct examination of  the colon should be repeated every 5-10 years through 53 years of age. However, you may need to be screened more often if early forms of precancerous polyps or small growths are found. Skin Cancer  Check your skin from head to toe regularly.  Tell your health care provider about any new moles or changes in moles, especially if there is a change in a mole's shape or color.  Also tell your health care provider if you have a mole that is larger than the size of a pencil eraser.  Always use sunscreen. Apply sunscreen liberally and repeatedly throughout the day.  Protect yourself by wearing long sleeves, pants, a wide-brimmed hat, and sunglasses whenever you are outside. HEART DISEASE, DIABETES, AND HIGH BLOOD PRESSURE   High blood pressure causes heart disease and increases the risk of stroke. High blood pressure is more likely to develop in:  People who have blood pressure in the high end of the normal range (130-139/85-89 mm Hg).  People who are overweight or obese.  People who are African American.  If you are 3-61 years of age, have your blood pressure checked every 3-5 years. If you are 42 years of age or older, have your blood pressure checked every year. You should have your blood pressure measured twice--once when you are at a hospital or clinic, and once when you are not at a hospital or clinic. Record the average of the two measurements. To check your blood pressure when you are not at a hospital or clinic, you can use:  An automated blood pressure machine at a pharmacy.  A home blood pressure monitor.  If you are between 79 years and 76 years old, ask your health care provider if you should take aspirin to prevent strokes.  Have regular diabetes screenings. This involves taking a blood sample to check your fasting blood sugar level.  If you are at a normal weight and have a low risk for diabetes, have this test once every three years after 53 years of age.  If you are  overweight and have a high risk for diabetes, consider being tested at a younger age or more often. PREVENTING INFECTION  Hepatitis B  If you have a higher risk for hepatitis B, you should be screened for this virus. You are considered at high risk for hepatitis B if:  You were born in a country where hepatitis B is common. Ask your health care provider which countries are considered high risk.  Your parents were born in a high-risk country, and you have not been immunized against hepatitis B (hepatitis B vaccine).  You have HIV or AIDS.  You use needles to inject street drugs.  You live with someone who has hepatitis B.  You have had sex with someone who has hepatitis B.  You get hemodialysis treatment.  You take certain medicines for conditions, including cancer, organ transplantation, and autoimmune conditions. Hepatitis C  Blood testing is recommended for:  Everyone born from [birth year] through 1963.  Anyone with known risk factors for hepatitis  C. Sexually transmitted infections (STIs)  You should be screened for sexually transmitted infections (STIs) including gonorrhea and chlamydia if:  You are sexually active and are younger than 53 years of age.  You are older than 53 years of age and your health care provider tells you that you are at risk for this type of infection.  Your sexual activity has changed since you were last screened and you are at an increased risk for chlamydia or gonorrhea. Ask your health care provider if you are at risk.  If you do not have HIV, but are at risk, it may be recommended that you take a prescription medicine daily to prevent HIV infection. This is called pre-exposure prophylaxis (PrEP). You are considered at risk if:  You are sexually active and do not regularly use condoms or know the HIV status of your partner(s).  You take drugs by injection.  You are sexually active with a partner who has HIV. Talk with your health care provider  about whether you are at high risk of being infected with HIV. If you choose to begin PrEP, you should first be tested for HIV. You should then be tested every 3 months for as long as you are taking PrEP.  PREGNANCY   If you are premenopausal and you may become pregnant, ask your health care provider about preconception counseling.  If you may become pregnant, take 400 to 800 micrograms (mcg) of folic acid every day.  If you want to prevent pregnancy, talk to your health care provider about birth control (contraception). OSTEOPOROSIS AND MENOPAUSE   Osteoporosis is a disease in which the bones lose minerals and strength with aging. This can result in serious bone fractures. Your risk for osteoporosis can be identified using a bone density scan.  If you are 42 years of age or older, or if you are at risk for osteoporosis and fractures, ask your health care provider if you should be screened.  Ask your health care provider whether you should take a calcium or vitamin D supplement to lower your risk for osteoporosis.  Menopause may have certain physical symptoms and risks.  Hormone replacement therapy may reduce some of these symptoms and risks. Talk to your health care provider about whether hormone replacement therapy is right for you.  HOME CARE INSTRUCTIONS   Schedule regular health, dental, and eye exams.  Stay current with your immunizations.   Do not use any tobacco products including cigarettes, chewing tobacco, or electronic cigarettes.  If you are pregnant, do not drink alcohol.  If you are breastfeeding, limit how much and how often you drink alcohol.  Limit alcohol intake to no more than 1 drink per day for nonpregnant women. One drink equals 12 ounces of beer, 5 ounces of wine, or 1 ounces of hard liquor.  Do not use street drugs.  Do not share needles.  Ask your health care provider for help if you need support or information about quitting drugs.  Tell your  health care provider if you often feel depressed.  Tell your health care provider if you have ever been abused or do not feel safe at home.   This information is not intended to replace advice given to you by your health care provider. Make sure you discuss any questions you have with your health care provider.   Document Released: 05/12/2011 Document Revised: 11/17/2014 Document Reviewed: 09/28/2013 Elsevier Interactive Patient Education 2016 [place] K. Panosh M.D.

## 2016-08-29 ENCOUNTER — Encounter: Payer: Self-pay | Admitting: Internal Medicine

## 2016-08-29 ENCOUNTER — Ambulatory Visit (INDEPENDENT_AMBULATORY_CARE_PROVIDER_SITE_OTHER): Payer: BLUE CROSS/BLUE SHIELD | Admitting: Internal Medicine

## 2016-08-29 VITALS — BP 110/60 | HR 86 | Temp 97.7°F | Wt 198.8 lb

## 2016-08-29 DIAGNOSIS — E7849 Other hyperlipidemia: Secondary | ICD-10-CM

## 2016-08-29 DIAGNOSIS — E784 Other hyperlipidemia: Secondary | ICD-10-CM | POA: Diagnosis not present

## 2016-08-29 DIAGNOSIS — Z0000 Encounter for general adult medical examination without abnormal findings: Secondary | ICD-10-CM

## 2016-08-29 DIAGNOSIS — R7301 Impaired fasting glucose: Secondary | ICD-10-CM | POA: Diagnosis not present

## 2016-08-29 DIAGNOSIS — E781 Pure hyperglyceridemia: Secondary | ICD-10-CM

## 2016-08-29 DIAGNOSIS — Z2821 Immunization not carried out because of patient refusal: Secondary | ICD-10-CM

## 2016-08-29 NOTE — Patient Instructions (Signed)
Intensify lifestyle interventions.  Tracking  As we discussed    Time away from stress.   Recheck fasting lipid and hg a1c in 4-6 months with or without OV.   Health Maintenance, Female Adopting a healthy lifestyle and getting preventive care can go a long way to promote health and wellness. Talk with your health care provider about what schedule of regular examinations is right for you. This is a good chance for you to check in with your provider about disease prevention and staying healthy. In between checkups, there are plenty of things you can do on your own. Experts have done a lot of research about which lifestyle changes and preventive measures are most likely to keep you healthy. Ask your health care provider for more information. WEIGHT AND DIET  Eat a healthy diet  Be sure to include plenty of vegetables, fruits, low-fat dairy products, and lean protein.  Do not eat a lot of foods high in solid fats, added sugars, or salt.  Get regular exercise. This is one of the most important things you can do for your health.  Most adults should exercise for at least 150 minutes each week. The exercise should increase your heart rate and make you sweat (moderate-intensity exercise).  Most adults should also do strengthening exercises at least twice a week. This is in addition to the moderate-intensity exercise.  Maintain a healthy weight  Body mass index (BMI) is a measurement that can be used to identify possible weight problems. It estimates body fat based on height and weight. Your health care provider can help determine your BMI and help you achieve or maintain a healthy weight.  For females 21 years of age and older:   A BMI below 18.5 is considered underweight.  A BMI of 18.5 to 24.9 is normal.  A BMI of 25 to 29.9 is considered overweight.  A BMI of 30 and above is considered obese.  Watch levels of cholesterol and blood lipids  You should start having your blood tested for  lipids and cholesterol at 53 years of age, then have this test every 5 years.  You may need to have your cholesterol levels checked more often if:  Your lipid or cholesterol levels are high.  You are older than 53 years of age.  You are at high risk for heart disease.  CANCER SCREENING   Lung Cancer  Lung cancer screening is recommended for adults 56-64 years old who are at high risk for lung cancer because of a history of smoking.  A yearly low-dose CT scan of the lungs is recommended for people who:  Currently smoke.  Have quit within the past 15 years.  Have at least a 30-pack-year history of smoking. A pack year is smoking an average of one pack of cigarettes a day for 1 year.  Yearly screening should continue until it has been 15 years since you quit.  Yearly screening should stop if you develop a health problem that would prevent you from having lung cancer treatment.  Breast Cancer  Practice breast self-awareness. This means understanding how your breasts normally appear and feel.  It also means doing regular breast self-exams. Let your health care provider know about any changes, no matter how small.  If you are in your 20s or 30s, you should have a clinical breast exam (CBE) by a health care provider every 1-3 years as part of a regular health exam.  If you are 49 or older, have a CBE  every year. Also consider having a breast X-ray (mammogram) every year.  If you have a family history of breast cancer, talk to your health care provider about genetic screening.  If you are at high risk for breast cancer, talk to your health care provider about having an MRI and a mammogram every year.  Breast cancer gene (BRCA) assessment is recommended for women who have family members with BRCA-related cancers. BRCA-related cancers include:  Breast.  Ovarian.  Tubal.  Peritoneal cancers.  Results of the assessment will determine the need for genetic counseling and BRCA1  and BRCA2 testing. Cervical Cancer Your health care provider may recommend that you be screened regularly for cancer of the pelvic organs (ovaries, uterus, and vagina). This screening involves a pelvic examination, including checking for microscopic changes to the surface of your cervix (Pap test). You may be encouraged to have this screening done every 3 years, beginning at age 68.  For women ages 70-65, health care providers may recommend pelvic exams and Pap testing every 3 years, or they may recommend the Pap and pelvic exam, combined with testing for human papilloma virus (HPV), every 5 years. Some types of HPV increase your risk of cervical cancer. Testing for HPV may also be done on women of any age with unclear Pap test results.  Other health care providers may not recommend any screening for nonpregnant women who are considered low risk for pelvic cancer and who do not have symptoms. Ask your health care provider if a screening pelvic exam is right for you.  If you have had past treatment for cervical cancer or a condition that could lead to cancer, you need Pap tests and screening for cancer for at least 20 years after your treatment. If Pap tests have been discontinued, your risk factors (such as having a new sexual partner) need to be reassessed to determine if screening should resume. Some women have medical problems that increase the chance of getting cervical cancer. In these cases, your health care provider may recommend more frequent screening and Pap tests. Colorectal Cancer  This type of cancer can be detected and often prevented.  Routine colorectal cancer screening usually begins at 53 years of age and continues through 53 years of age.  Your health care provider may recommend screening at an earlier age if you have risk factors for colon cancer.  Your health care provider may also recommend using home test kits to check for hidden blood in the stool.  A small camera at the  end of a tube can be used to examine your colon directly (sigmoidoscopy or colonoscopy). This is done to check for the earliest forms of colorectal cancer.  Routine screening usually begins at age 73.  Direct examination of the colon should be repeated every 5-10 years through 53 years of age. However, you may need to be screened more often if early forms of precancerous polyps or small growths are found. Skin Cancer  Check your skin from head to toe regularly.  Tell your health care provider about any new moles or changes in moles, especially if there is a change in a mole's shape or color.  Also tell your health care provider if you have a mole that is larger than the size of a pencil eraser.  Always use sunscreen. Apply sunscreen liberally and repeatedly throughout the day.  Protect yourself by wearing long sleeves, pants, a wide-brimmed hat, and sunglasses whenever you are outside. HEART DISEASE, DIABETES, AND HIGH BLOOD PRESSURE  High blood pressure causes heart disease and increases the risk of stroke. High blood pressure is more likely to develop in:  People who have blood pressure in the high end of the normal range (130-139/85-89 mm Hg).  People who are overweight or obese.  People who are African American.  If you are 18-39 years of age, have your blood pressure checked every 3-5 years. If you are 40 years of age or older, have your blood pressure checked every year. You should have your blood pressure measured twice--once when you are at a hospital or clinic, and once when you are not at a hospital or clinic. Record the average of the two measurements. To check your blood pressure when you are not at a hospital or clinic, you can use:  An automated blood pressure machine at a pharmacy.  A home blood pressure monitor.  If you are between 55 years and 79 years old, ask your health care provider if you should take aspirin to prevent strokes.  Have regular diabetes  screenings. This involves taking a blood sample to check your fasting blood sugar level.  If you are at a normal weight and have a low risk for diabetes, have this test once every three years after 53 years of age.  If you are overweight and have a high risk for diabetes, consider being tested at a younger age or more often. PREVENTING INFECTION  Hepatitis B  If you have a higher risk for hepatitis B, you should be screened for this virus. You are considered at high risk for hepatitis B if:  You were born in a country where hepatitis B is common. Ask your health care provider which countries are considered high risk.  Your parents were born in a high-risk country, and you have not been immunized against hepatitis B (hepatitis B vaccine).  You have HIV or AIDS.  You use needles to inject street drugs.  You live with someone who has hepatitis B.  You have had sex with someone who has hepatitis B.  You get hemodialysis treatment.  You take certain medicines for conditions, including cancer, organ transplantation, and autoimmune conditions. Hepatitis C  Blood testing is recommended for:  Everyone born from 1945 through 1965.  Anyone with known risk factors for hepatitis C. Sexually transmitted infections (STIs)  You should be screened for sexually transmitted infections (STIs) including gonorrhea and chlamydia if:  You are sexually active and are younger than 53 years of age.  You are older than 53 years of age and your health care provider tells you that you are at risk for this type of infection.  Your sexual activity has changed since you were last screened and you are at an increased risk for chlamydia or gonorrhea. Ask your health care provider if you are at risk.  If you do not have HIV, but are at risk, it may be recommended that you take a prescription medicine daily to prevent HIV infection. This is called pre-exposure prophylaxis (PrEP). You are considered at risk  if:  You are sexually active and do not regularly use condoms or know the HIV status of your partner(s).  You take drugs by injection.  You are sexually active with a partner who has HIV. Talk with your health care provider about whether you are at high risk of being infected with HIV. If you choose to begin PrEP, you should first be tested for HIV. You should then be tested every 3 months for as   long as you are taking PrEP.  PREGNANCY   If you are premenopausal and you may become pregnant, ask your health care provider about preconception counseling.  If you may become pregnant, take 400 to 800 micrograms (mcg) of folic acid every day.  If you want to prevent pregnancy, talk to your health care provider about birth control (contraception). OSTEOPOROSIS AND MENOPAUSE   Osteoporosis is a disease in which the bones lose minerals and strength with aging. This can result in serious bone fractures. Your risk for osteoporosis can be identified using a bone density scan.  If you are 30 years of age or older, or if you are at risk for osteoporosis and fractures, ask your health care provider if you should be screened.  Ask your health care provider whether you should take a calcium or vitamin D supplement to lower your risk for osteoporosis.  Menopause may have certain physical symptoms and risks.  Hormone replacement therapy may reduce some of these symptoms and risks. Talk to your health care provider about whether hormone replacement therapy is right for you.  HOME CARE INSTRUCTIONS   Schedule regular health, dental, and eye exams.  Stay current with your immunizations.   Do not use any tobacco products including cigarettes, chewing tobacco, or electronic cigarettes.  If you are pregnant, do not drink alcohol.  If you are breastfeeding, limit how much and how often you drink alcohol.  Limit alcohol intake to no more than 1 drink per day for nonpregnant women. One drink equals 12  ounces of beer, 5 ounces of wine, or 1 ounces of hard liquor.  Do not use street drugs.  Do not share needles.  Ask your health care provider for help if you need support or information about quitting drugs.  Tell your health care provider if you often feel depressed.  Tell your health care provider if you have ever been abused or do not feel safe at home.   This information is not intended to replace advice given to you by your health care provider. Make sure you discuss any questions you have with your health care provider.   Document Released: 05/12/2011 Document Revised: 11/17/2014 Document Reviewed: 09/28/2013 Elsevier Interactive Patient Education [employer].

## 2016-08-29 NOTE — Progress Notes (Signed)
Pre visit review using our clinic review tool, if applicable. No additional management support is needed unless otherwise documented below in the visit note. 

## 2016-09-08 ENCOUNTER — Other Ambulatory Visit: Payer: Self-pay | Admitting: Internal Medicine

## 2016-09-10 NOTE — Telephone Encounter (Signed)
Sent to the pharmacy by e-scribe for 6 months. 

## 2016-10-08 NOTE — Progress Notes (Signed)
HPI: FU PAF. Event monitor in 2010 showed sinus to sinus tachycardia. Stress echocardiogram April 2010 normal. TSH November 2016 normal. Echocardiogram December 2016 showed normal LV systolic function. Monitor December 2016 showed sinus rhythm with paroxysmal atrial fibrillation. Since last seen, she has mild dyspnea on exertion but no orthopnea or PND. She denies chest pain. She has had occasional brief flutter but no sustained palpitations that she feels is atrial fibrillation.  Current Outpatient Prescriptions  Medication Sig Dispense Refill  . Calcium Carb-Cholecalciferol (CALTRATE 600+D) 600-800 MG-UNIT TABS Take 1 tablet by mouth daily.    Marland Kitchen diltiazem (CARDIZEM CD) 120 MG 24 hr capsule Take 1 capsule (120 mg total) by mouth daily. 90 capsule 3  . estradiol (ESTRACE) 1 MG tablet Take 1 mg by mouth daily.    . Magnesium 250 MG TABS Take by mouth.    . Multiple Vitamin (MULTIVITAMIN) tablet Take 1 tablet by mouth daily.     No current facility-administered medications for this visit.      Past Medical History:  Diagnosis Date  . Endometriosis 1996  . GERD (gastroesophageal reflux disease)   . H/O osteopenia   . HLD (hyperlipidemia)   . Hx of varicella   . NEPHROLITHIASIS 02/22/2009   Qualifier: Diagnosis of  By: Burt Knack CMA, Cecille Rubin    . Other specified congenital anomaly of kidney   . Palpitations   . Prediabetes   . Transfusion history    placental abruption 1986  . Tremor     Past Surgical History:  Procedure Laterality Date  . ABDOMINAL HYSTERECTOMY    . ABDOMINAL SURGERY     for congenital kidney abnormality  . CARPAL TUNNEL RELEASE    . CESAREAN SECTION  [id number]  . LAPAROSCOPIC SALPINGO OOPHERECTOMY    . LITHOTRIPSY  1992-1996   multiple  . NOSE SURGERY    . surgical fusion disk 5& 6      Social History   Social History  . Marital status: Married    Spouse name: N/A  . Number of children: 2  . Years of education: N/A   Occupational History  .  Education officer, community   Social History Main Topics  . Smoking status: Never Smoker  . Smokeless tobacco: Never Used  . Alcohol use No  . Drug use: No  . Sexual activity: Yes    Birth control/ protection: Surgical   Other Topics Concern  . Not on file   Social History Narrative   Usually gets 9 hours of sleep per night   4 people living in the home.   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 Cancer Mother     Colon cancer, breast cancer and lung cancer  . Tremor Mother   . Colon cancer Mother   . Lung cancer Mother     never smoked or around cig smoke  . Arthritis Father   . Vision loss Father     Macular degeneration  . Scoliosis Father   . Hashimoto's thyroiditis Daughter   . Asthma Sister   . Asthma Brother   . Osteoporosis Maternal Grandmother   . Other Sister     twin   NAFLD  . Esophageal cancer Neg Hx   . Rectal cancer Neg  Hx   . Stomach cancer Neg Hx     ROS: no fevers or chills, productive cough, hemoptysis, dysphasia, odynophagia, melena, hematochezia, dysuria, hematuria, rash, seizure activity, orthopnea, PND, pedal edema, claudication. Remaining systems are negative.  Physical Exam: Well-developed well-nourished in no acute distress.  Skin is warm and dry.  HEENT is normal.  Neck is supple.  Chest is clear to auscultation with normal expansion.  Cardiovascular exam is regular rate and rhythm.  Abdominal exam nontender or distended. No masses palpated. Extremities show no edema. neuro grossly intact   A/P  1 Paroxysmal atrial fibrillation-patient is in sinus this morning. Toprol stopped previously due to fatigue and rash. Continue Cardizem. Patient has embolic risk of female sex. There was a question of diabetes mellitus in the past but apparently this has never been diagnosed. Therefore her CHADSvasc is 1. We will  continue off of anticoagulation. If she is diagnosed with diabetes mellitus in the future then she would need to be anticoagulated long-term.  2 Hyperlipidemia-management per primary care.  Kirk Ruths, MD

## 2016-10-15 ENCOUNTER — Ambulatory Visit (INDEPENDENT_AMBULATORY_CARE_PROVIDER_SITE_OTHER): Payer: BLUE CROSS/BLUE SHIELD | Admitting: Cardiology

## 2016-10-15 ENCOUNTER — Encounter: Payer: Self-pay | Admitting: Cardiology

## 2016-10-15 VITALS — BP 107/66 | HR 72 | Ht 63.0 in | Wt 195.8 lb

## 2016-10-15 DIAGNOSIS — E7800 Pure hypercholesterolemia, unspecified: Secondary | ICD-10-CM

## 2016-10-15 DIAGNOSIS — I480 Paroxysmal atrial fibrillation: Secondary | ICD-10-CM

## 2016-10-15 NOTE — Patient Instructions (Signed)
Your physician wants you to follow-up in: 6 MONTHS WITH DR CRENSHAW You will receive a reminder letter in the mail two months in advance. If you don't receive a letter, please call our office to schedule the follow-up appointment.   If you need a refill on your cardiac medications before your next appointment, please call your pharmacy.  

## 2016-10-24 ENCOUNTER — Ambulatory Visit: Payer: BLUE CROSS/BLUE SHIELD | Admitting: Cardiology

## 2017-01-26 ENCOUNTER — Telehealth: Payer: Self-pay | Admitting: Cardiology

## 2017-01-26 MED ORDER — ATENOLOL 25 MG PO TABS: 25.0000 mg | ORAL_TABLET | Freq: Every day | ORAL | 3 refills | Status: DC

## 2017-01-26 NOTE — Telephone Encounter (Signed)
New message     Pt c/o medication issue:  1. Name of Medication:  diltiazem (CARDIZEM CD) 120 MG 24 hr capsule(Expired) Take 1 capsule (120 mg total) by mouth daily.     2. How are you currently taking this medication (dosage and times per day)? 1 tablet a day  3. Are you having a reaction (difficulty breathing--STAT)?  Pt states having pain in stomach shooting all the way to ear, and itching  4. What is your medication issue? Sneezing and headache , stomach pain, they are getting worse the more she take the pill

## 2017-01-26 NOTE — Telephone Encounter (Signed)
Would try daily to see if she tolerates. Kirk Ruths

## 2017-01-26 NOTE — Telephone Encounter (Signed)
PT  NOTIFIED ./CY 

## 2017-01-26 NOTE — Telephone Encounter (Signed)
PT  NOW WANTING TO KNOW  IF  CAN  TAKE MED ON A AS NEEDED  BASES .Adonis Housekeeper

## 2017-01-26 NOTE — Telephone Encounter (Signed)
DC cardizem; atenolol 25 mg po daily Kirk Ruths

## 2017-01-26 NOTE — Telephone Encounter (Signed)
PER PT  CONTINUES  TO  C/O  SIDE EFECTS   WITH DILTIAZEM   1 HOUR  AFTER  TAKING   HAS  ESOPHOGEAL PAIN THAT  RADIATES TO R  EAR,  ITCHING  ALL OVER  ,SNEEZING , H/A , AND  DIZZINESS. DIZZINESS  WAS  SO  BAD  PT  HELD  MED OVER  WEEKEND  BUT   WHEN  TRIED TO  GO TO BED  LAST  NIGHT   HEART RATE  WAS 94  AND  B/P 137/88  SO PT   HAD  TO  TAKE  DILTIAZEM  LAST NIGHT WILL FORWARD  TO DR  Stanford Breed  FOR  REVIEW .Adonis Housekeeper

## 2017-02-16 ENCOUNTER — Other Ambulatory Visit: Payer: Self-pay | Admitting: Family Medicine

## 2017-02-16 DIAGNOSIS — R002 Palpitations: Secondary | ICD-10-CM

## 2017-02-16 DIAGNOSIS — R739 Hyperglycemia, unspecified: Secondary | ICD-10-CM

## 2017-02-16 DIAGNOSIS — R7303 Prediabetes: Secondary | ICD-10-CM

## 2017-02-16 DIAGNOSIS — E785 Hyperlipidemia, unspecified: Secondary | ICD-10-CM

## 2017-02-16 DIAGNOSIS — E7800 Pure hypercholesterolemia, unspecified: Secondary | ICD-10-CM

## 2017-02-17 ENCOUNTER — Other Ambulatory Visit (INDEPENDENT_AMBULATORY_CARE_PROVIDER_SITE_OTHER): Payer: BLUE CROSS/BLUE SHIELD

## 2017-02-17 DIAGNOSIS — R739 Hyperglycemia, unspecified: Secondary | ICD-10-CM

## 2017-02-17 DIAGNOSIS — R7303 Prediabetes: Secondary | ICD-10-CM

## 2017-02-17 DIAGNOSIS — E785 Hyperlipidemia, unspecified: Secondary | ICD-10-CM

## 2017-02-17 DIAGNOSIS — E7800 Pure hypercholesterolemia, unspecified: Secondary | ICD-10-CM | POA: Diagnosis not present

## 2017-02-17 DIAGNOSIS — R7989 Other specified abnormal findings of blood chemistry: Secondary | ICD-10-CM

## 2017-02-17 LAB — LIPID PANEL
Cholesterol: 186 mg/dL (ref 0–200)
HDL: 36.6 mg/dL — ABNORMAL LOW
NonHDL: 149.69
Total CHOL/HDL Ratio: 5
Triglycerides: 346 mg/dL — ABNORMAL HIGH (ref 0.0–149.0)
VLDL: 69.2 mg/dL — ABNORMAL HIGH (ref 0.0–40.0)

## 2017-02-17 LAB — LDL CHOLESTEROL, DIRECT: Direct LDL: 100 mg/dL

## 2017-02-17 LAB — HEMOGLOBIN A1C: Hgb A1c MFr Bld: 5.7 % (ref 4.6–6.5)

## 2017-07-31 ENCOUNTER — Encounter: Payer: Self-pay | Admitting: Internal Medicine

## 2018-02-15 ENCOUNTER — Telehealth: Payer: Self-pay | Admitting: Cardiology

## 2018-02-15 NOTE — Telephone Encounter (Signed)
STAT if HR is under 50 or over 120 (normal HR is 60-100 beats per minute)   On 02/13/2018   1) What is your heart rate? 108  2) Do you have a log of your heart rate readings (document readings)?   105-108 while pt was sleeping for 8 hours   3) Do you have any other symptoms? Pain in the right side of her neck and discomfort in her heart aching - that lasted 24 hours  Pt is not having any of the issues now her HR is 90

## 2018-02-15 NOTE — Progress Notes (Signed)
Chief Complaint  Patient presents with  . Fatigue    x1 month, sleeping 12+ hours a day, continuous without waking, feel fatigued contantly, no change in medications    HPI: Meredith Finley 57 y.o. come in for  Concern about    Excess sleeping  recnetly  upt to 10 - 12 hours and not that rested     Last seen  By me 10 17 , but has specialists   Under care   Has afib  To have further testing   Monitoring  Has se of b blockers  Not taking    But given low dose bystoli hasnt started yet .   Asking for lab work up  And metabiotic  Evaluation for poss causes  r  Sleeping  9 - 10 hours  At night   ? Other metabolic issue  From .   Metformin once a day  Feels that this helps .   osa under care due for recheck    By sleep specialsits   No depression but has had stress [ast year with deaths in family .   Never was on atenolol concerns  No supp x vitamines .  jsut ate  Hours or seo ago    Old r knee injury sometimes given out .   No sig supp x   Vitamins   ROS: See pertinent positives and negatives per HPI.  Past Medical History:  Diagnosis Date  . Endometriosis 1996  . GERD (gastroesophageal reflux disease)   . H/O osteopenia   . HLD (hyperlipidemia)   . Hx of varicella   . NEPHROLITHIASIS 02/22/2009   Qualifier: Diagnosis of  By: Burt Knack CMA, Cecille Rubin    . Other specified congenital anomaly of kidney   . Palpitations   . Prediabetes   . Transfusion history    placental abruption 1986  . Tremor     Family History  Problem Relation Age of Onset  . Cancer Mother        Colon cancer, breast cancer and lung cancer  . Tremor Mother   . Colon cancer Mother   . Lung cancer Mother        never smoked or around cig smoke  . Arthritis Father   . Vision loss Father        Macular degeneration  . Scoliosis Father   . Hashimoto's thyroiditis Daughter   . Asthma Sister   . Asthma Brother   . Osteoporosis Maternal Grandmother   . Other Sister        twin   NAFLD  . Esophageal  cancer Neg Hx   . Rectal cancer Neg Hx   . Stomach cancer Neg Hx     Social History   Socioeconomic History  . Marital status: Married    Spouse name: Not on file  . Number of children: 2  . Years of education: Not on file  . Highest education level: Not on file  Occupational History  . Occupation: Psychiatrist: [employer]  Social Needs  . Financial resource strain: Not on file  . Food insecurity:    Worry: Not on file    Inability: Not on file  . Transportation needs:    Medical: Not on file    Non-medical: Not on file  Tobacco Use  . Smoking status: Never Smoker  . Smokeless tobacco: Never Used  Substance and Sexual Activity  . Alcohol use: No  . Drug use: No  .  Sexual activity: Yes    Birth control/protection: Surgical  Lifestyle  . Physical activity:    Days per week: Not on file    Minutes per session: Not on file  . Stress: Not on file  Relationships  . Social connections:    Talks on phone: Not on file    Gets together: Not on file    Attends religious service: Not on file    Active member of club or organization: Not on file    Attends meetings of clubs or organizations: Not on file    Relationship status: Not on file  Other Topics Concern  . Not on file  Social History Narrative   Usually gets 9 hours of sleep per night   4 people living in the home.   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 aspirin EC 81 MG tablet Take 81 mg by mouth daily.    . Cholecalciferol (VITAMIN D3) 2000 units capsule Take 2,000 Units by mouth daily.    . Cyanocobalamin (VITAMIN B-12 PO) Take 1,500 mcg by mouth daily.    Marland Kitchen estradiol (ESTRACE) 1 MG tablet Take 1 mg by mouth daily.    . Magnesium 250 MG TABS Take by mouth.    . Melatonin-Pyridoxine (MELATIN PO) Take 5 mg by mouth as needed.    . metFORMIN  (GLUCOPHAGE-XR) 500 MG 24 hr tablet Take 500 mg by mouth daily with breakfast.    . Multiple Vitamin (MULTIVITAMIN) tablet Take 1 tablet by mouth daily.    . nebivolol (BYSTOLIC) 2.5 MG tablet Take 1 tablet (2.5 mg total) by mouth daily. 90 tablet 3   No facility-administered medications prior to visit.      EXAM:  BP (!) 108/58 (BP Location: Left Arm, Patient Position: Sitting, Cuff Size: Normal)   Pulse 84   Temp 97.9 F (36.6 C) (Oral)   Wt 190 lb 6.4 oz (86.4 kg)   BMI 33.73 kg/m   Body mass index is 33.73 kg/m.  GENERAL: vitals reviewed and listed above, alert, oriented, appears well hydrated and in no acute distress HEENT: atraumatic, conjunctiva  clear, no obvious abnormalities on inspection of external nose and ears OP : no lesion edema or exudate  NECK: no obvious masses on inspection palpation ? Thyroid palp LUNGS: clear to auscultation bilaterally, no wheezes, rales or rhonchi,  CV: HRRR, no clubbing cyanosis or  peripheral edema nl cap refill  MS: moves all extremities without noticeable focal  Abnormality Abdomen:  Sof,t normal bowel sounds without hepatosplenomegaly, no guarding rebound or masses no CVA tenderness Skin left trunk 1 cm fibroma   likje lesion Pink  Non tender  PSYCH: pleasant and cooperative, no obvious depression or anxiety Lab Results  Component Value Date   WBC 9.0 08/21/2016   HGB 13.3 08/21/2016   HCT 38.7 08/21/2016   PLT 266.0 08/21/2016   GLUCOSE 101 (H) 08/21/2016   CHOL 186 02/17/2017   TRIG 346.0 (H) 02/17/2017   HDL 36.60 (L) 02/17/2017   LDLDIRECT 100.0 02/17/2017   ALT 26 08/21/2016   AST 24 08/21/2016   NA 138 08/21/2016   K 4.1 08/21/2016   CL 103 08/21/2016   CREATININE 0.62 08/21/2016   BUN 12 08/21/2016   CO2 28 08/21/2016  TSH 1.46 08/21/2016   HGBA1C 5.7 02/17/2017   BP Readings from Last 3 Encounters:  02/16/18 (!) 108/58  02/16/18 108/68  10/15/16 107/66    ASSESSMENT AND PLAN:  Discussed the following  assessment and plan:  Hypersomnolence - insetting osa  but no obv underlying cause   - Plan: Basic metabolic panel, CBC with Differential/Platelet, Hemoglobin A1c, Hepatic function panel, Lipid panel, TSH, T4, free, T3, free  Medication management - Plan: Basic metabolic panel, CBC with Differential/Platelet, Hemoglobin A1c, Hepatic function panel, Lipid panel, TSH, T4, free, T3, free  Hyperglycemia - Plan: Basic metabolic panel, CBC with Differential/Platelet, Hemoglobin A1c, Hepatic function panel, Lipid panel, TSH, T4, free, T3, free  Obstructive sleep apnea - Plan: Basic metabolic panel, CBC with Differential/Platelet, Hemoglobin A1c, Hepatic function panel, Lipid panel, TSH, T4, free, T3, free  Paroxysmal atrial fibrillation (HCC) - Plan: Basic metabolic panel, CBC with Differential/Platelet, Hemoglobin A1c, Hepatic function panel, Lipid panel, TSH, T4, free, T3, free  Knee gives out, unspecified laterality Post prandial    Labs today for convenience   Knee gives out  Suggest  Sports medicine   Let us know if need referral  Left skin  prob fibroma can return for removal aor see dermatology  Not alarming . Exam unrevealing   As to cause  Poss sleep dx  -Patient advised to return or notify health care team  if  new concerns arise.  Patient Instructions  Will notify you  of labs when available.   Please   Have you see your sleep specialist   About the hypersomnia .   Pleas have them send [place] a copy of evaluation    [name]. Panosh M.D.

## 2018-02-15 NOTE — Telephone Encounter (Signed)
Spoke with patient and she requested appointment. Scheduled for visit tomorrow morning with Dr Stanford Breed.

## 2018-02-16 ENCOUNTER — Encounter: Payer: Self-pay | Admitting: Cardiology

## 2018-02-16 ENCOUNTER — Ambulatory Visit: Payer: BLUE CROSS/BLUE SHIELD | Admitting: Internal Medicine

## 2018-02-16 ENCOUNTER — Encounter: Payer: Self-pay | Admitting: Internal Medicine

## 2018-02-16 ENCOUNTER — Ambulatory Visit: Payer: BLUE CROSS/BLUE SHIELD | Admitting: Cardiology

## 2018-02-16 VITALS — BP 108/68 | HR 75 | Ht 63.0 in | Wt 190.2 lb

## 2018-02-16 VITALS — BP 108/58 | HR 84 | Temp 97.9°F | Wt 190.4 lb

## 2018-02-16 DIAGNOSIS — G4710 Hypersomnia, unspecified: Secondary | ICD-10-CM

## 2018-02-16 DIAGNOSIS — R002 Palpitations: Secondary | ICD-10-CM | POA: Diagnosis not present

## 2018-02-16 DIAGNOSIS — R739 Hyperglycemia, unspecified: Secondary | ICD-10-CM | POA: Diagnosis not present

## 2018-02-16 DIAGNOSIS — G4733 Obstructive sleep apnea (adult) (pediatric): Secondary | ICD-10-CM | POA: Diagnosis not present

## 2018-02-16 DIAGNOSIS — M25369 Other instability, unspecified knee: Secondary | ICD-10-CM

## 2018-02-16 DIAGNOSIS — R072 Precordial pain: Secondary | ICD-10-CM | POA: Diagnosis not present

## 2018-02-16 DIAGNOSIS — Z79899 Other long term (current) drug therapy: Secondary | ICD-10-CM | POA: Diagnosis not present

## 2018-02-16 DIAGNOSIS — R0609 Other forms of dyspnea: Secondary | ICD-10-CM | POA: Diagnosis not present

## 2018-02-16 DIAGNOSIS — I480 Paroxysmal atrial fibrillation: Secondary | ICD-10-CM

## 2018-02-16 DIAGNOSIS — R0600 Dyspnea, unspecified: Secondary | ICD-10-CM

## 2018-02-16 LAB — TSH
TSH: 1.59 u[IU]/mL (ref 0.450–4.500)
TSH: 1 u[IU]/mL (ref 0.35–4.50)

## 2018-02-16 LAB — CBC WITH DIFFERENTIAL/PLATELET
BASOS PCT: 0.5 % (ref 0.0–3.0)
Basophils Absolute: 0.1 10*3/uL (ref 0.0–0.1)
EOS PCT: 2.1 % (ref 0.0–5.0)
Eosinophils Absolute: 0.2 10*3/uL (ref 0.0–0.7)
HCT: 40.5 % (ref 36.0–46.0)
Hemoglobin: 13.8 g/dL (ref 12.0–15.0)
LYMPHS ABS: 3.4 10*3/uL (ref 0.7–4.0)
Lymphocytes Relative: 31.2 % (ref 12.0–46.0)
MCHC: 34.1 g/dL (ref 30.0–36.0)
MCV: 92.1 fl (ref 78.0–100.0)
MONO ABS: 0.4 10*3/uL (ref 0.1–1.0)
Monocytes Relative: 3.9 % (ref 3.0–12.0)
NEUTROS PCT: 62.3 % (ref 43.0–77.0)
Neutro Abs: 6.7 10*3/uL (ref 1.4–7.7)
Platelets: 290 10*3/uL (ref 150.0–400.0)
RBC: 4.39 Mil/uL (ref 3.87–5.11)
RDW: 13.4 % (ref 11.5–15.5)
WBC: 10.8 10*3/uL — ABNORMAL HIGH (ref 4.0–10.5)

## 2018-02-16 LAB — LDL CHOLESTEROL, DIRECT: LDL DIRECT: 99 mg/dL

## 2018-02-16 LAB — LIPID PANEL
Cholesterol: 177 mg/dL (ref 0–200)
HDL: 40.9 mg/dL (ref >39.00)
NONHDL: 136.41
TRIGLYCERIDES: 335 mg/dL — AB (ref 0.0–149.0)
Total CHOL/HDL Ratio: 4
VLDL: 67 mg/dL — ABNORMAL HIGH (ref 0.0–40.0)

## 2018-02-16 LAB — BASIC METABOLIC PANEL
BUN: 10 mg/dL (ref 6–23)
CHLORIDE: 101 meq/L (ref 96–112)
CO2: 32 mEq/L (ref 19–32)
CREATININE: 0.61 mg/dL (ref 0.40–1.20)
Calcium: 9.3 mg/dL (ref 8.4–10.5)
GFR: 108.14 mL/min (ref >60.00)
Glucose, Bld: 114 mg/dL — ABNORMAL HIGH (ref 70–99)
POTASSIUM: 4 meq/L (ref 3.5–5.1)
Sodium: 139 mEq/L (ref 135–145)

## 2018-02-16 LAB — HEPATIC FUNCTION PANEL
ALK PHOS: 102 U/L (ref 39–117)
ALT: 15 U/L (ref 0–35)
AST: 15 U/L (ref 0–37)
Albumin: 3.8 g/dL (ref 3.5–5.2)
BILIRUBIN DIRECT: 0.1 mg/dL (ref 0.0–0.3)
TOTAL PROTEIN: 7 g/dL (ref 6.0–8.3)
Total Bilirubin: 0.6 mg/dL (ref 0.2–1.2)

## 2018-02-16 LAB — T3, FREE: T3, Free: 3.5 pg/mL (ref 2.3–4.2)

## 2018-02-16 LAB — HEMOGLOBIN A1C: HEMOGLOBIN A1C: 5.7 % (ref 4.6–6.5)

## 2018-02-16 LAB — T4, FREE: Free T4: 0.79 ng/dL (ref 0.60–1.60)

## 2018-02-16 MED ORDER — NEBIVOLOL HCL 2.5 MG PO TABS: 2.5000 mg | ORAL_TABLET | Freq: Every day | ORAL | 3 refills | Status: DC

## 2018-02-16 NOTE — Patient Instructions (Signed)
Will notify you  of labs when available.   Please   Have you see your sleep specialist   About the hypersomnia .   Pleas have them send [place] a copy of evaluation

## 2018-02-16 NOTE — Patient Instructions (Signed)
Medication Instructions:   STOP ATENOLOL  START BYSTOLIC 2.5 MG ONCE DAILY AT BEDTIME  Testing/Procedures:  Your physician has requested that you have an exercise tolerance test. For further information please visit [email or web address]. Please also follow instruction sheet, as given.   Your physician has requested that you have an echocardiogram. Echocardiography is a painless test that uses sound waves to create images of your heart. It provides your doctor with information about the size and shape of your heart and how well your heart's chambers and valves are working. This procedure takes approximately one hour. There are no restrictions for this procedure.   Your physician has recommended that you wear a 30 DAY event monitor. Event monitors are medical devices that record the heart's electrical activity. Doctors most often [place] these monitors to diagnose arrhythmias. Arrhythmias are problems with the speed or rhythm of the heartbeat. The monitor is a small, portable device. You can wear one while you do your normal daily activities. This is usually used to diagnose what is causing palpitations/syncope (passing out).    Follow-Up:  Your physician recommends that you schedule a follow-up appointment in: [place]   If you need a refill on your cardiac medications before your next appointment, please call your pharmacy.

## 2018-02-16 NOTE — Progress Notes (Signed)
HPI: FU PAF. Event monitor in 2010 showed sinus to sinus tachycardia. Stress echocardiogram April 2010 normal. TSH November 2016 normal. Echocardiogram December 2016 showed normal LV systolic function. Monitor December 2016 showed sinus rhythm with paroxysmal atrial fibrillation. Since last seen,  patient notes her heart rate is elevated more week with palpitations.  She notes some dyspnea on exertion but no orthopnea, PND or pedal edema.  She had chest tightness for 24 hours continuously  Current Outpatient Medications  Medication Sig Dispense Refill  . aspirin EC 81 MG tablet Take 81 mg by mouth daily.    . Cholecalciferol (VITAMIN D3) 2000 units capsule Take 2,000 Units by mouth daily.    . Cyanocobalamin (VITAMIN B-12 PO) Take 1,500 mcg by mouth daily.    Marland Kitchen estradiol (ESTRACE) 1 MG tablet Take 1 mg by mouth daily.    . Magnesium 250 MG TABS Take by mouth.    . Melatonin-Pyridoxine (MELATIN PO) Take 5 mg by mouth as needed.    . metFORMIN (GLUCOPHAGE-XR) 500 MG 24 hr tablet Take 500 mg by mouth daily with breakfast.    . Multiple Vitamin (MULTIVITAMIN) tablet Take 1 tablet by mouth daily.    Marland Kitchen atenolol (TENORMIN) 25 MG tablet Take 1 tablet (25 mg total) by mouth daily. (Patient not taking: Reported on 02/16/2018) 90 tablet 3   No current facility-administered medications for this visit.      Past Medical History:  Diagnosis Date  . Endometriosis 1996  . GERD (gastroesophageal reflux disease)   . H/O osteopenia   . HLD (hyperlipidemia)   . Hx of varicella   . NEPHROLITHIASIS 02/22/2009   Qualifier: Diagnosis of  By: Burt Knack CMA, Cecille Rubin    . Other specified congenital anomaly of kidney   . Palpitations   . Prediabetes   . Transfusion history    placental abruption 1986  . Tremor     Past Surgical History:  Procedure Laterality Date  . ABDOMINAL HYSTERECTOMY    . ABDOMINAL SURGERY     for congenital kidney abnormality  . CARPAL TUNNEL RELEASE    . CESAREAN SECTION   [id number]  . LAPAROSCOPIC SALPINGO OOPHERECTOMY    . LITHOTRIPSY  1992-1996   multiple  . NOSE SURGERY    . surgical fusion disk 5& 6      Social History   Socioeconomic History  . Marital status: Married    Spouse name: Not on file  . Number of children: 2  . Years of education: Not on file  . Highest education level: Not on file  Occupational History  . Occupation: Psychiatrist: [employer]  Social Needs  . Financial resource strain: Not on file  . Food insecurity:    Worry: Not on file    Inability: Not on file  . Transportation needs:    Medical: Not on file    Non-medical: Not on file  Tobacco Use  . Smoking status: Never Smoker  . Smokeless tobacco: Never Used  Substance and Sexual Activity  . Alcohol use: No  . Drug use: No  . Sexual activity: Yes    Birth control/protection: Surgical  Lifestyle  . Physical activity:    Days per week: Not on file    Minutes per session: Not on file  . Stress: Not on file  Relationships  . Social connections:    Talks on phone: Not on file    Gets together: Not on file  Attends religious service: Not on file    Active member of club or organization: Not on file    Attends meetings of clubs or organizations: Not on file    Relationship status: Not on file  . Intimate partner violence:    Fear of current or ex partner: Not on file    Emotionally abused: Not on file    Physically abused: Not on file    Forced sexual activity: Not on file  Other Topics Concern  . Not on file  Social History Narrative   Usually gets 9 hours of sleep per night   4 people living in the home.   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 Cancer Mother        Colon cancer, breast cancer and lung cancer  . Tremor Mother   . Colon cancer Mother   . Lung cancer Mother        never smoked or around cig  smoke  . Arthritis Father   . Vision loss Father        Macular degeneration  . Scoliosis Father   . Hashimoto's thyroiditis Daughter   . Asthma Sister   . Asthma Brother   . Osteoporosis Maternal Grandmother   . Other Sister        twin   NAFLD  . Esophageal cancer Neg Hx   . Rectal cancer Neg Hx   . Stomach cancer Neg Hx     ROS: Head, neck and shoulder pain no fevers or chills, productive cough, hemoptysis, dysphasia, odynophagia, melena, hematochezia, dysuria, hematuria, rash, seizure activity, orthopnea, PND, pedal edema, claudication. Remaining systems are negative.  Physical Exam: Well-developed well-nourished in no acute distress.  Skin is warm and dry.  HEENT is normal.  Neck is supple.  Chest is clear to auscultation with normal expansion.  Cardiovascular exam is regular rate and rhythm.  Abdominal exam nontender or distended. No masses palpated. Extremities show no edema. neuro grossly intact  ECG-sinus rhythm at a rate of 75.  Minor nonspecific ST changes.  Personally reviewed  A/P  1 Paroxysmal atrial fibrillation- patient remains in sinus rhythm today. She states that she has not been diagnosed with diabetes mellitus. Her only embolic risk factor is female sex.   She is therefore not anticoagulated.  She is not taking atenolol.  I will try Bystolic 2.5 mg nightly.  She is complaining of palpitations.  I will plan to check event monitor to further assess.  We will plan to repeat echocardiogram. Check TSH.  2 chest tightness-symptoms atypical.  I will arrange an exercise treadmill for risk stratification.  2 hyperlipidemia-managed by primary care.  Kirk Ruths, MD

## 2018-02-17 ENCOUNTER — Telehealth: Payer: Self-pay | Admitting: *Deleted

## 2018-02-17 NOTE — Telephone Encounter (Signed)
PA for bystolic started through covermymeds. Await response.

## 2018-02-18 ENCOUNTER — Telehealth (HOSPITAL_COMMUNITY): Payer: Self-pay

## 2018-02-18 NOTE — Telephone Encounter (Signed)
Encounter complete. 

## 2018-02-23 ENCOUNTER — Ambulatory Visit (HOSPITAL_BASED_OUTPATIENT_CLINIC_OR_DEPARTMENT_OTHER): Payer: BLUE CROSS/BLUE SHIELD

## 2018-02-23 ENCOUNTER — Ambulatory Visit (HOSPITAL_COMMUNITY)
Admission: RE | Admit: 2018-02-23 | Discharge: 2018-02-23 | Disposition: A | Discharge status: Home / Self Care | Payer: BLUE CROSS/BLUE SHIELD | Source: Ambulatory Visit | Attending: Cardiology | Admitting: Cardiology

## 2018-02-23 ENCOUNTER — Other Ambulatory Visit: Payer: Self-pay | Admitting: *Deleted

## 2018-02-23 ENCOUNTER — Other Ambulatory Visit: Payer: Self-pay

## 2018-02-23 DIAGNOSIS — I371 Nonrheumatic pulmonary valve insufficiency: Secondary | ICD-10-CM | POA: Insufficient documentation

## 2018-02-23 DIAGNOSIS — R002 Palpitations: Secondary | ICD-10-CM | POA: Diagnosis not present

## 2018-02-23 DIAGNOSIS — I071 Rheumatic tricuspid insufficiency: Secondary | ICD-10-CM | POA: Insufficient documentation

## 2018-02-23 DIAGNOSIS — R072 Precordial pain: Secondary | ICD-10-CM

## 2018-02-23 DIAGNOSIS — E785 Hyperlipidemia, unspecified: Secondary | ICD-10-CM | POA: Insufficient documentation

## 2018-02-23 DIAGNOSIS — R0609 Other forms of dyspnea: Secondary | ICD-10-CM | POA: Insufficient documentation

## 2018-02-23 DIAGNOSIS — E7800 Pure hypercholesterolemia, unspecified: Secondary | ICD-10-CM

## 2018-02-23 DIAGNOSIS — R0600 Dyspnea, unspecified: Secondary | ICD-10-CM

## 2018-02-23 LAB — EXERCISE TOLERANCE TEST
CSEPED: 8 min
CSEPEW: 10.1 METS
CSEPPHR: 184 {beats}/min
Exercise duration (sec): 1 s
MPHR: 184 {beats}/min
Percent HR: 111 %
RPE: 18
Rest HR: 75 {beats}/min

## 2018-02-24 ENCOUNTER — Other Ambulatory Visit (HOSPITAL_COMMUNITY): Payer: BLUE CROSS/BLUE SHIELD

## 2018-02-24 ENCOUNTER — Telehealth: Payer: Self-pay | Admitting: *Deleted

## 2018-02-24 DIAGNOSIS — R072 Precordial pain: Secondary | ICD-10-CM

## 2018-02-24 DIAGNOSIS — R9430 Abnormal result of cardiovascular function study, unspecified: Secondary | ICD-10-CM

## 2018-02-24 DIAGNOSIS — R0600 Dyspnea, unspecified: Secondary | ICD-10-CM

## 2018-02-24 DIAGNOSIS — R0609 Other forms of dyspnea: Secondary | ICD-10-CM

## 2018-02-24 NOTE — Telephone Encounter (Signed)
Patient aware of results and recommendations.   Order placed.   Message sent to precert.    Advised we will call with instructions.

## 2018-02-24 NOTE — Telephone Encounter (Signed)
Schedule cardiac cta Kirk Ruths

## 2018-02-24 NOTE — Telephone Encounter (Signed)
-----   Message from Blenda Bridegroom sent at 02/24/2018  7:02 AM EDT ----- Regarding: Abnormal ETT Abnormal ETT read by Dr Marlou Porch on February 23, 2018. This test was ordered by Dr Stanford Breed.

## 2018-03-01 MED ORDER — PREDNISONE 50 MG PO TABS: ORAL_TABLET | ORAL | 0 refills | Status: DC

## 2018-03-01 NOTE — Telephone Encounter (Signed)
please make sure she is premedicated for dye allergy  Kirk Ruths

## 2018-03-01 NOTE — Telephone Encounter (Addendum)
Spoke with pt, she is aware she will need pre-med prior to CT scan due to contrast allergy. Per radiology protocol she will take prednisone 50 mg at 13 hours prior, 7 hours prior and 1 hour prior to scan. She will take benadryl 50 mg 1 hour prior. New script sent to the pharmacy

## 2018-03-08 ENCOUNTER — Other Ambulatory Visit (HOSPITAL_COMMUNITY): Payer: BLUE CROSS/BLUE SHIELD

## 2018-03-08 ENCOUNTER — Ambulatory Visit (INDEPENDENT_AMBULATORY_CARE_PROVIDER_SITE_OTHER): Payer: BLUE CROSS/BLUE SHIELD

## 2018-03-08 DIAGNOSIS — R002 Palpitations: Secondary | ICD-10-CM | POA: Diagnosis not present

## 2018-03-16 ENCOUNTER — Telehealth: Payer: Self-pay | Admitting: Internal Medicine

## 2018-03-16 NOTE — Telephone Encounter (Signed)
Copied from [place] [id number]. Topic: Quick Communication - Rx Refill/Question >> Mar 16, 2018  1:17 PM Lennox Solders wrote: Medication:metformin xr 500 mg #90 Has the patient contacted their pharmacy? No (Agent: If no, request that the patient contact the pharmacy for the refill.)  Preferred Pharmacy (with phone number or street name):[facility]

## 2018-03-17 MED ORDER — METFORMIN HCL ER 500 MG PO TB24: 500.0000 mg | ORAL_TABLET | Freq: Every day | ORAL | 0 refills | Status: DC

## 2018-03-22 ENCOUNTER — Ambulatory Visit (HOSPITAL_COMMUNITY): Payer: BLUE CROSS/BLUE SHIELD

## 2018-03-22 ENCOUNTER — Ambulatory Visit (HOSPITAL_COMMUNITY)
Admission: RE | Admit: 2018-03-22 | Discharge: 2018-03-22 | Disposition: A | Discharge status: Home / Self Care | Payer: BLUE CROSS/BLUE SHIELD | Source: Ambulatory Visit | Attending: Cardiology | Admitting: Cardiology

## 2018-03-22 ENCOUNTER — Encounter (HOSPITAL_COMMUNITY): Payer: Self-pay

## 2018-03-22 DIAGNOSIS — R9430 Abnormal result of cardiovascular function study, unspecified: Secondary | ICD-10-CM

## 2018-03-22 DIAGNOSIS — R0600 Dyspnea, unspecified: Secondary | ICD-10-CM

## 2018-03-22 DIAGNOSIS — R0609 Other forms of dyspnea: Secondary | ICD-10-CM

## 2018-03-22 DIAGNOSIS — R072 Precordial pain: Secondary | ICD-10-CM

## 2018-03-22 NOTE — Sedation Documentation (Signed)
Pt procedure canceled per Dr. Johnsie Cancel due to allergy and intolerances to medications ( IV DYE and Metoprolol) needed to be given for procedure. Pt explained situation and updated that cardiologist office will contact with future appointment. Patient discharged.

## 2018-03-23 ENCOUNTER — Telehealth: Payer: Self-pay | Admitting: *Deleted

## 2018-03-23 DIAGNOSIS — R072 Precordial pain: Secondary | ICD-10-CM

## 2018-03-23 NOTE — Telephone Encounter (Signed)
-----   Message from Lelon Perla, MD sent at 03/22/2018  3:55 PM EDT ----- Schedule lexiscan nuclear study Kirk Ruths  ----- Message ----- From: Josue Hector, MD Sent: 03/22/2018   3:18 PM To: Cristopher Estimable, RN, Lelon Perla, MD  Cancelled cardiac CT allergic to metoprolol and ivp dye HR 90  Needs f/u myovue

## 2018-03-23 NOTE — Telephone Encounter (Signed)
Spoke with pt, lexiscan scheduled. Location and directions regarding the test discussed in detail with the patient.

## 2018-03-25 ENCOUNTER — Telehealth (HOSPITAL_COMMUNITY): Payer: Self-pay

## 2018-03-25 NOTE — Telephone Encounter (Signed)
Encounter complete. 

## 2018-03-26 ENCOUNTER — Telehealth: Payer: Self-pay | Admitting: Cardiology

## 2018-03-26 NOTE — Telephone Encounter (Signed)
Spoke with pt, aware part of the appointment is canceled but the other part is no show. Call placed to radiology at [facility].

## 2018-03-26 NOTE — Telephone Encounter (Signed)
New Message:      Pt is calling and states she has received a letter from [facility] that states she was a  No Show on 5/13. Pt states she was present but Dr. Stanford Breed and radiologist decided for the pt not to have the procedure

## 2018-03-29 ENCOUNTER — Encounter: Payer: Self-pay | Admitting: Cardiology

## 2018-03-29 ENCOUNTER — Telehealth: Payer: Self-pay | Admitting: *Deleted

## 2018-03-29 MED ORDER — BISOPROLOL FUMARATE 5 MG PO TABS: ORAL_TABLET | ORAL | 5 refills | Status: DC

## 2018-03-29 NOTE — Telephone Encounter (Signed)
Patient came by office today regarding the rash from Bystolic. She did have the same kind of rash with Metoprolol and Cardizem but has never taken Atenolol. Patient is willing to try the Bisoprolol however the lowest pill size is the 5 mg tablet. Discussed with Raquel Pharm D and will start her on 5 mg 1/2 tablet daily. Patient aware of change and Rx sent to [facility] Did call and ask them to d/c remaining refills on Bystolic.  Patient to call back if any further issues.   Rodriguez-Guzman, Raquel, RPH  to [place], [place]      03/29/18 2:32 PM  Good afternoon Mrs Zaragoza,   My name is Raquel and I am one of the clinical pharmacist that work with Dr Stanford Breed. If you are experiencing and allergic reaction to Bystolic, we should go ahead ans STOP the medication immediatly.  You will need to initiate another medication on its place to keep palpitations and a fib under control.   Noted previous trial with metoprolol and atenolol as well. I will recommend initiating bisoprolol 1.25mg  daily.   Please let me know if you agree with this recommendation and I will send a new prescription to your prefer pharmacy.   Raquel Rodriguez-Guzman PharmD, BCPS, [place]  [place] [address]  03/29/2018 2:31 PM  .    03/29/18 1:52 PM   Please advise on possible allergic reaction.    Benard Halsted  to Lelon Perla, MD      03/29/18 12:18 PM  I am having an adverse reaction to Bystolic. I have been breaking out in an itchy rash for the last several days. I feel like I should stop the medication. do I just stop it since I am on the lowest dose or is there some way that I need to titrate down? I can be reached on my cell phone at [phone] today.  Rosiland Oz

## 2018-03-30 ENCOUNTER — Encounter: Payer: Self-pay | Admitting: Gastroenterology

## 2018-03-31 ENCOUNTER — Ambulatory Visit (HOSPITAL_COMMUNITY)
Admission: RE | Admit: 2018-03-31 | Discharge: 2018-03-31 | Disposition: A | Discharge status: Home / Self Care | Payer: BLUE CROSS/BLUE SHIELD | Source: Ambulatory Visit | Attending: Cardiology | Admitting: Cardiology

## 2018-03-31 DIAGNOSIS — R072 Precordial pain: Secondary | ICD-10-CM

## 2018-03-31 DIAGNOSIS — K219 Gastro-esophageal reflux disease without esophagitis: Secondary | ICD-10-CM | POA: Insufficient documentation

## 2018-03-31 DIAGNOSIS — R002 Palpitations: Secondary | ICD-10-CM | POA: Insufficient documentation

## 2018-03-31 DIAGNOSIS — R0609 Other forms of dyspnea: Secondary | ICD-10-CM | POA: Insufficient documentation

## 2018-03-31 DIAGNOSIS — I2510 Atherosclerotic heart disease of native coronary artery without angina pectoris: Secondary | ICD-10-CM | POA: Insufficient documentation

## 2018-03-31 DIAGNOSIS — E669 Obesity, unspecified: Secondary | ICD-10-CM | POA: Diagnosis not present

## 2018-03-31 DIAGNOSIS — G4733 Obstructive sleep apnea (adult) (pediatric): Secondary | ICD-10-CM | POA: Diagnosis not present

## 2018-03-31 DIAGNOSIS — I4891 Unspecified atrial fibrillation: Secondary | ICD-10-CM | POA: Diagnosis not present

## 2018-03-31 DIAGNOSIS — Z6833 Body mass index (BMI) 33.0-33.9, adult: Secondary | ICD-10-CM | POA: Diagnosis not present

## 2018-03-31 LAB — MYOCARDIAL PERFUSION IMAGING
CHL CUP NUCLEAR SSS: 9
CHL CUP RESTING HR STRESS: 61 {beats}/min
LV sys vol: 19 mL
LVDIAVOL: 71 mL (ref 46–106)
NUC STRESS TID: 1.29
Peak HR: 110 {beats}/min
SDS: 4
SRS: 5

## 2018-03-31 MED ORDER — REGADENOSON 0.4 MG/5ML IV SOLN
0.4000 mg | Freq: Once | INTRAVENOUS | Status: AC
  Administered 2018-03-31: 0.4 mg via INTRAVENOUS

## 2018-03-31 MED ORDER — TECHNETIUM TC 99M TETROFOSMIN IV KIT
27.2000 | PACK | Freq: Once | INTRAVENOUS | Status: AC | PRN
  Administered 2018-03-31: 27.2 via INTRAVENOUS
  Filled 2018-03-31: qty 28

## 2018-03-31 MED ORDER — TECHNETIUM TC 99M TETROFOSMIN IV KIT
8.2000 | PACK | Freq: Once | INTRAVENOUS | Status: AC | PRN
  Administered 2018-03-31: 8.2 via INTRAVENOUS
  Filled 2018-03-31: qty 9

## 2018-04-13 NOTE — Progress Notes (Signed)
Meredith Finley was seen today in the movement disorders clinic for neurologic consultation at the request of Panosh, [name], MD.  The consultation is for the evaluation of tremor.  This patient is accompanied in the office by her spouse who supplements the history.  Tremor has been going on in the R hand for 5 years.  She notices it the most at rest; she states that she feels that she is tremoring on the inside.  Her husband reports that he only notices it when the patient points it out to him.  She states that her mother had a tremor that started about the age of 50 but it progressed with time.  She was told that she didn't have PD but she had a "neurologic disorder." She reports that she is worried about adrenoleukodystrophy because a cousin is a carrier of it (sounds like addisons type only).  She also states that she has decreased grasp on the right.  She has numbness mostly over the pinky, ring and middle fingers and travels up to near the elbow.  It is worse at night.  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  She does report that she saw Dr. Leta Baptist a few years ago and she was told that she had a few "white spots" on the brain.  She was told to just "watch it" with time.  His notes are not available   Also reports that she has trouble with incontinence.  This is not stress incontinence but states that "it just leaks."  Also c/o low blood pressure  04/19/18 update: Patient is seen today for pains in the head.  I have not seen her since 2016.  At that point, her complaint was tremor, but none was noted on examination.  She had an MRI of the brain and cervical spine which were reviewed.  The patient had an unremarkable MRI of the brain.  She had evidence of cervical spondylosis and central canal stenosis at the C5-C6 level.  Neurosurgery referral was recommended and she was referred to Dr. Vertell Limber.  I do not have any notes regarding that consultation.  He did do surgery.  I have reviewed  records since our last visit.  Nothing is noted about the head pains she complains about today.  I do not see that she has consulted her primary care about this.  Pt states that she has stabbing like pain in the R vertex.  Last 15-30 seconds.  Has 1 episode per day.  No lateralizing features.  No autonomic features.  Doesn't do anything for it.  Sometimes it will shoot pain into the right forehead.  No N/V.  Also c/o paresthesias in the right pinky and ring finger.  Had CTS and did better after that but started again.  She is dropping objects again.  ALLERGIES:   Allergies  Allergen Reactions  . Bystolic [Nebivolol Hcl]     Rash   . Ivp Dye [Iodinated Diagnostic Agents] Hives  . Metoprolol Rash and Other (See Comments)    Fatigue,  . Niacin Nausea And Vomiting and Rash    CURRENT MEDICATIONS:  Outpatient Encounter Medications as of 04/19/2018  Medication Sig  . aspirin EC 81 MG tablet Take 81 mg by mouth daily.  . bisoprolol (ZEBETA) 5 MG tablet 1/2 tablet daily  . Cholecalciferol (VITAMIN D3) 2000 units capsule Take 2,000 Units by mouth daily.  . Cyanocobalamin (VITAMIN B-12 PO) Take 1,500 mcg by mouth daily.  Marland Kitchen  estradiol (ESTRACE) 1 MG tablet Take 1 mg by mouth daily.  . Magnesium 250 MG TABS Take by mouth.  . Melatonin-Pyridoxine (MELATIN PO) Take 5 mg by mouth as needed.  . metFORMIN (GLUCOPHAGE-XR) 500 MG 24 hr tablet Take 1 tablet (500 mg total) by mouth daily with breakfast.  . Multiple Vitamin (MULTIVITAMIN) tablet Take 1 tablet by mouth daily.  . indomethacin (INDOCIN SR) 75 MG CR capsule Take 1 capsule (75 mg total) by mouth daily.  . [DISCONTINUED] predniSONE (DELTASONE) 50 MG tablet Take 1 tablet 13 hours prior to scan, take 1 tablet 7 hours prior to scan and 1 tablet 1 hour prior to scan   No facility-administered encounter medications on file as of 04/19/2018.     PAST MEDICAL HISTORY:   Past Medical History:  Diagnosis Date  . Endometriosis 1996  . GERD  (gastroesophageal reflux disease)   . H/O osteopenia   . HLD (hyperlipidemia)   . Hx of varicella   . NEPHROLITHIASIS 02/22/2009   Qualifier: Diagnosis of  By: Burt Knack CMA, Cecille Rubin    . Other specified congenital anomaly of kidney   . Palpitations   . Prediabetes   . Transfusion history    placental abruption 1986  . Tremor     PAST SURGICAL HISTORY:   Past Surgical History:  Procedure Laterality Date  . ABDOMINAL HYSTERECTOMY    . ABDOMINAL SURGERY     for congenital kidney abnormality  . CARPAL TUNNEL RELEASE    . CESAREAN SECTION  [id number]  . LAPAROSCOPIC SALPINGO OOPHERECTOMY    . LITHOTRIPSY  1992-1996   multiple  . NOSE SURGERY    . surgical fusion disk 5& 6      SOCIAL HISTORY:   Social History   Socioeconomic History  . Marital status: Married    Spouse name: Not on file  . Number of children: 2  . Years of education: Not on file  . Highest education level: Not on file  Occupational History  . Occupation: Psychiatrist: [employer]  Social Needs  . Financial resource strain: Not on file  . Food insecurity:    Worry: Not on file    Inability: Not on file  . Transportation needs:    Medical: Not on file    Non-medical: Not on file  Tobacco Use  . Smoking status: Never Smoker  . Smokeless tobacco: Never Used  Substance and Sexual Activity  . Alcohol use: No  . Drug use: No  . Sexual activity: Yes    Birth control/protection: Surgical  Lifestyle  . Physical activity:    Days per week: Not on file    Minutes per session: Not on file  . Stress: Not on file  Relationships  . Social connections:    Talks on phone: Not on file    Gets together: Not on file    Attends religious service: Not on file    Active member of club or organization: Not on file    Attends meetings of clubs or organizations: Not on file    Relationship status: Not on file  . Intimate partner violence:    Fear of current or ex partner: Not on file     Emotionally abused: Not on file    Physically abused: Not on file    Forced sexual activity: Not on file  Other Topics Concern  . Not on file  Social History Narrative   Usually gets 9 hours of sleep per  night   4 people living in the home.   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 Mother  Deceased       colon, breast, lung cancer  . Father  Alive       rheumatoid arthritis  . Sister identical twin Alive       fatty liver disease  . Brother  Deceased       asthma  . Brother  Alive       healthy  . Brother  Alive       healthy  . Daughter  Alive       hashimoto's  . Daughter  Alive       healthy  . MGM  (Not Specified)  . Neg Hx  (Not Specified)    ROS:  Review of Systems  Constitutional: Negative.   HENT: Negative.   Eyes: Negative.   Respiratory: Negative.   Cardiovascular: Negative.   Gastrointestinal: Negative.   Genitourinary: Negative.   Musculoskeletal: Positive for back pain.  Neurological: Positive for weakness (R hand; dropping objects) and headaches.  Endo/Heme/Allergies: Negative.   Psychiatric/Behavioral: Negative.     PHYSICAL EXAMINATION:    VITALS:   Vitals:   04/19/18 0911  BP: (!) 90/58  Pulse: 68  SpO2: 96%  Weight: 191 lb (86.6 kg)  Height: 5\' 3"  (1.6 m)    GEN:  The patient appears stated age and is in NAD. HEENT:  Normocephalic, atraumatic.  The mucous membranes are moist. The superficial temporal arteries are without ropiness or tenderness. CV:  RRR Lungs:  CTAB Neck/HEME:  There are no carotid bruits bilaterally.  Neurological examination:   Gen:  Appears stated age and in NAD. HEENT:  Normocephalic, atraumatic. The mucous membranes are moist. The superficial temporal arteries are without ropiness or tenderness.  Able to open/close jaw without trouble.  No TMJ tenderness Cardiovascular: Regular  rate and rhythm. Lungs: Clear to auscultation bilaterally. Neck: There are no carotid bruits noted bilaterally.  NEUROLOGICAL:  Orientation:  The patient is alert and oriented x 3.  Recent and remote memory are intact.  Attention span and concentration are normal.  Able to name objects and repeat without trouble.  Fund of knowledge is appropriate Cranial nerves: There is good facial symmetry. The pupils are equal round and reactive to light bilaterally. Fundoscopic exam reveals clear disc margins bilaterally. Extraocular muscles are intact and visual fields are full to confrontational testing. Speech is fluent and clear. Soft palate rises symmetrically and there is no tongue deviation. Hearing is intact to conversational tone. Tone: Tone is good throughout. Sensation: Sensation is intact to light touch and pinprick throughout (facial, trunk, extremities). Vibration is intact at the bilateral big toe. There is no extinction with double simultaneous stimulation. There is no sensory dermatomal level identified. Coordination:  The patient has no difficulty with RAM's or FNF bilaterally. Motor: Strength is 5/5 in the bilateral upper and lower extremities.  Shoulder shrug is equal bilaterally.  There is no pronator drift.  There are no fasciculations noted. DTR's: Deep tendon reflexes are 2/4 at the bilateral biceps, triceps, brachioradialis, patella and achilles.  Plantar responses are downgoing bilaterally. Gait and Station: The patient is able to ambulate without difficulty. The patient is able to heel toe  walk without any difficulty. The patient is able to ambulate in a tandem fashion. The patient is able to stand in the Romberg position.    Lab Results  Component Value Date   VITAMINB12 289 10/01/2015      Chemistry      Component Value Date/Time   NA 139 02/16/2018 1346   K 4.0 02/16/2018 1346   CL 101 02/16/2018 1346   CO2 32 02/16/2018 1346   BUN 10 02/16/2018 1346   CREATININE 0.61  02/16/2018 1346   CREATININE 0.72 07/18/2016 1600      Component Value Date/Time   CALCIUM 9.3 02/16/2018 1346   ALKPHOS 102 02/16/2018 1346   AST 15 02/16/2018 1346   ALT 15 02/16/2018 1346   BILITOT 0.6 02/16/2018 1346     Lab Results  Component Value Date   TSH 1.00 02/16/2018     ASSESSMENT/PLAN:  1.  Idiopathic stabbing headache  -Discussed nature and etiology.  Discussed that Indocin can be helpful, but also discussed the potential risks that Indocin can bring with it, including GI risks and kidney risks (kidney function is normal).  Ultimately, she would like to take the medication.  I told her to take it with food.  I told her to take Pepcid while taking the medication.  She will take Indocin SR, 75 mg, 1 tablet daily for a month and then discontinue the medication. 2.  Hand paresthesias, R  -Previous had carpal tunnel release and thought she did better for a long time and now is dropping objects again.  She points to the ring and pinky finger, which makes one think ulnar neuropathy.  She described a several years ago and EMG demonstrated carpal tunnel (prior to carpal tunnel release).  We will repeat her EMG. 3.  b12 deficiencey  -will recheck b12.  Currently taking supplement every other day. 4.  Follow-up will be on an as-needed basis.

## 2018-04-13 NOTE — Progress Notes (Deleted)
Meredith Finley was seen today in the movement disorders clinic for neurologic consultation at the request of Panosh, [name], MD.  The consultation is for the evaluation of tremor.  This patient is accompanied in the office by her spouse who supplements the history.  Tremor has been going on in the R hand for 5 years.  She notices it the most at rest; she states that she feels that she is tremoring on the inside.  Her husband reports that he only notices it when the patient points it out to him.  She states that her mother had a tremor that started about the age of 15 but it progressed with time.  She was told that she didn't have PD but she had a "neurologic disorder." She reports that she is worried about adrenoleukodystrophy because a cousin is a carrier of it (sounds like addisons type only).  She also states that she has decreased grasp on the right.  She has numbness mostly over the pinky, ring and middle fingers and travels up to near the elbow.  It is worse at night.  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  She does report that she saw Dr. Leta Baptist a few years ago and she was told that she had a few "white spots" on the brain.  She was told to just "watch it" with time.  His notes are not available   Also reports that she has trouble with incontinence.  This is not stress incontinence but states that "it just leaks."  Also c/o low blood pressure  04/14/18 update: Patient is seen today for pains in the head.  I have not seen her since 2016.  At that point, her complaint was tremor, but none was noted on examination.  She had an MRI of the brain and cervical spine which were reviewed.  The patient had an unremarkable MRI of the brain.  She had evidence of cervical spondylosis and central canal stenosis at the C5-C6 level.  Neurosurgery referral was recommended and she was referred to Dr. Vertell Limber.  I do not have any notes regarding that consultation.  I have reviewed records since our last  visit.  Nothing is noted about the head pains she complains about today.  I do not see that she has consulted her primary care about this.  ALLERGIES:   Allergies  Allergen Reactions  . Bystolic [Nebivolol Hcl]     Rash   . Ivp Dye [Iodinated Diagnostic Agents] Hives  . Metoprolol Rash and Other (See Comments)    Fatigue,  . Niacin Nausea And Vomiting and Rash    CURRENT MEDICATIONS:  Outpatient Encounter Medications as of 04/15/2018  Medication Sig  . aspirin EC 81 MG tablet Take 81 mg by mouth daily.  . bisoprolol (ZEBETA) 5 MG tablet 1/2 tablet daily  . Cholecalciferol (VITAMIN D3) 2000 units capsule Take 2,000 Units by mouth daily.  . Cyanocobalamin (VITAMIN B-12 PO) Take 1,500 mcg by mouth daily.  Marland Kitchen estradiol (ESTRACE) 1 MG tablet Take 1 mg by mouth daily.  . Magnesium 250 MG TABS Take by mouth.  . Melatonin-Pyridoxine (MELATIN PO) Take 5 mg by mouth as needed.  . metFORMIN (GLUCOPHAGE-XR) 500 MG 24 hr tablet Take 1 tablet (500 mg total) by mouth daily with breakfast.  . Multiple Vitamin (MULTIVITAMIN) tablet Take 1 tablet by mouth daily.  . predniSONE (DELTASONE) 50 MG tablet Take 1 tablet 13 hours prior to scan, take 1  tablet 7 hours prior to scan and 1 tablet 1 hour prior to scan   No facility-administered encounter medications on file as of 04/15/2018.     PAST MEDICAL HISTORY:   Past Medical History:  Diagnosis Date  . Endometriosis 1996  . GERD (gastroesophageal reflux disease)   . H/O osteopenia   . HLD (hyperlipidemia)   . Hx of varicella   . NEPHROLITHIASIS 02/22/2009   Qualifier: Diagnosis of  By: Burt Knack CMA, Cecille Rubin    . Other specified congenital anomaly of kidney   . Palpitations   . Prediabetes   . Transfusion history    placental abruption 1986  . Tremor     PAST SURGICAL HISTORY:   Past Surgical History:  Procedure Laterality Date  . ABDOMINAL HYSTERECTOMY    . ABDOMINAL SURGERY     for congenital kidney abnormality  . CARPAL TUNNEL RELEASE    .  CESAREAN SECTION  [id number]  . LAPAROSCOPIC SALPINGO OOPHERECTOMY    . LITHOTRIPSY  1992-1996   multiple  . NOSE SURGERY    . surgical fusion disk 5& 6      SOCIAL HISTORY:   Social History   Socioeconomic History  . Marital status: Married    Spouse name: Not on file  . Number of children: 2  . Years of education: Not on file  . Highest education level: Not on file  Occupational History  . Occupation: Psychiatrist: [employer]  Social Needs  . Financial resource strain: Not on file  . Food insecurity:    Worry: Not on file    Inability: Not on file  . Transportation needs:    Medical: Not on file    Non-medical: Not on file  Tobacco Use  . Smoking status: Never Smoker  . Smokeless tobacco: Never Used  Substance and Sexual Activity  . Alcohol use: No  . Drug use: No  . Sexual activity: Yes    Birth control/protection: Surgical  Lifestyle  . Physical activity:    Days per week: Not on file    Minutes per session: Not on file  . Stress: Not on file  Relationships  . Social connections:    Talks on phone: Not on file    Gets together: Not on file    Attends religious service: Not on file    Active member of club or organization: Not on file    Attends meetings of clubs or organizations: Not on file    Relationship status: Not on file  . Intimate partner violence:    Fear of current or ex partner: Not on file    Emotionally abused: Not on file    Physically abused: Not on file    Forced sexual activity: Not on file  Other Topics Concern  . Not on file  Social History Narrative   Usually gets 9 hours of sleep per night   4 people living in the home.   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 Mother  Deceased       colon, breast, lung cancer  . Father  Alive       rheumatoid arthritis  . Sister  Alive        fatty liver disease  .  Brother  Alive       asthma  . Brother  Alive       healthy  . Brother  Alive       healthy  . Daughter  Alive       hashimoto's  . Daughter  Alive       healthy  . Sister  (Not Specified)  . Brother  (Not Specified)  . MGM  (Not Specified)  . Sister  (Not Specified)  . Neg Hx  (Not Specified)    ROS:  Some neck pain.  A complete 10 system review of systems was obtained and was unremarkable apart from what is mentioned above.  PHYSICAL EXAMINATION:    VITALS:   There were no vitals filed for this visit.  GEN:  The patient appears stated age and is in NAD. HEENT:  Normocephalic, atraumatic.  The mucous membranes are moist. The superficial temporal arteries are without ropiness or tenderness. CV:  RRR Lungs:  CTAB Neck/HEME:  There are no carotid bruits bilaterally.  Neurological examination:  Orientation: The patient is alert and oriented x3. Fund of knowledge is appropriate.  Recent and remote memory are intact.  Attention and concentration are normal.    Able to name objects and repeat phrases. Cranial nerves: There is good facial with the exception of slight left ptosis versus pseudoptosis. Pupils are equal round and reactive to light bilaterally. Fundoscopic exam reveals clear margins bilaterally. Extraocular muscles are intact. The visual fields are full to confrontational testing. The speech is fluent and clear. Soft palate rises symmetrically and there is no tongue deviation. Hearing is intact to conversational tone. Sensation: Sensation is intact to light and pinprick throughout (facial, trunk, extremities).  Pinprick was symmetric, including on the hands.  Vibration is intact at the bilateral big toe. There is no extinction with double simultaneous stimulation. There is no sensory dermatomal level identified. Motor: Strength is 5/5 in the bilateral upper and lower extremities.  There is slight decreased grasp bilaterally, but no asymmetry to this.   Shoulder shrug is equal and symmetric.  There is no pronator drift.  There is no wasting of the intrinsic muscles of the hands. Deep tendon reflexes: Deep tendon reflexes are 2/4 at the bilateral biceps, triceps, brachioradialis, 2+ at the bilateral patella and 2/4 at the bilateral achilles. Plantar responses are downgoing bilaterally.  Movement examination: Tone: There is normal tone in the bilateral upper extremities.  The tone in the lower extremities is normal.  Abnormal movements: No tremor is noted, either at rest or with activation (posture or intention).  No tremors noted with distraction procedures.  She is able to pour a very full glass of water from one glass to another without spilling any of it. Coordination:  There is no decremation with RAM's, with any form of RAMS, including alternating supination and pronation of the forearm, hand opening and closing, finger taps, heel taps and toe taps. Gait and Station: The patient has no difficulty arising out of a deep-seated chair without the use of the hands. The patient's stride length is normal with normal arm swing.  She has no trouble squatting down low and getting back up without the use of the hands.  She walks in tandem fashion and heel-toe walks without any difficulty.  Stands in Romberg position without trouble.      Lab Results  Component Value Date   VITAMINB12 289 10/01/2015      Chemistry      Component Value  Date/Time   NA 139 02/16/2018 1346   K 4.0 02/16/2018 1346   CL 101 02/16/2018 1346   CO2 32 02/16/2018 1346   BUN 10 02/16/2018 1346   CREATININE 0.61 02/16/2018 1346   CREATININE 0.72 07/18/2016 1600      Component Value Date/Time   CALCIUM 9.3 02/16/2018 1346   ALKPHOS 102 02/16/2018 1346   AST 15 02/16/2018 1346   ALT 15 02/16/2018 1346   BILITOT 0.6 02/16/2018 1346     Lab Results  Component Value Date   TSH 1.00 02/16/2018     ASSESSMENT/PLAN:  1.  Tremor by hx  -no visible tremor on examination  today  -As above, the patient was worried about adrenoleukodystrophy because a cousin had this.  I reassured her that I don't think that she has adrenoleukodystrophy.  First, this is a disease that primarily affects males (at least in the most serious variety).  In addition,  tremor is not generally a feature.  Finally, she is a little old for this diagnosis.  She has also had a normal AM cortisol.   She states that her cousin is female and diagnosed with "late onset variety" but pts sx's are not consistent with this.  -I did not see any evidence of a neurodegenerative disease like PD.  I told her that doesn't mean that this won't develop in the future, but I saw nothing today.  -Will do MRI brain as was told several years ago that had few white spots on MRI when saw Dr. Leta Baptist.  Will do MRI cervical spine as well given hyperreflexia, but is likely physiologic  -will do B12 and AchRAb's (mild ptosis on L - probable pseudoptosis from lid lag) 2.  Hand paresthesias, R  -sounds most consistent with ulnar neuropathy at the elbow on the right.  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  Certainly at risk for CTS as well.  We will do an EMG. 3.  We will call her as the results come in and follow up as needed.  Total visit time, 60 min with greater than 50% in counseling and coordinating care.

## 2018-04-15 ENCOUNTER — Ambulatory Visit: Payer: BLUE CROSS/BLUE SHIELD | Admitting: Neurology

## 2018-04-19 ENCOUNTER — Encounter: Payer: Self-pay | Admitting: Neurology

## 2018-04-19 ENCOUNTER — Ambulatory Visit (INDEPENDENT_AMBULATORY_CARE_PROVIDER_SITE_OTHER): Payer: BLUE CROSS/BLUE SHIELD | Admitting: Neurology

## 2018-04-19 ENCOUNTER — Other Ambulatory Visit: Payer: BLUE CROSS/BLUE SHIELD

## 2018-04-19 VITALS — BP 90/58 | HR 68 | Ht 63.0 in | Wt 191.0 lb

## 2018-04-19 DIAGNOSIS — G5621 Lesion of ulnar nerve, right upper limb: Secondary | ICD-10-CM

## 2018-04-19 DIAGNOSIS — G4485 Primary stabbing headache: Secondary | ICD-10-CM

## 2018-04-19 DIAGNOSIS — E538 Deficiency of other specified B group vitamins: Secondary | ICD-10-CM

## 2018-04-19 LAB — VITAMIN B12: VITAMIN B 12: 604 pg/mL (ref 200–1100)

## 2018-04-19 MED ORDER — INDOMETHACIN ER 75 MG PO CPCR: 75.0000 mg | ORAL_CAPSULE | Freq: Every day | ORAL | 0 refills | Status: DC

## 2018-04-19 NOTE — Patient Instructions (Signed)
1. Your provider has requested that you have labwork completed today. Please go to [facility] ([id number]) on the second floor of this building before leaving the office today. You do not need to check in. If you are not called within 15 minutes please check with the front desk.   2. We will schedule EMG.

## 2018-04-20 ENCOUNTER — Ambulatory Visit (INDEPENDENT_AMBULATORY_CARE_PROVIDER_SITE_OTHER): Payer: BLUE CROSS/BLUE SHIELD | Admitting: Neurology

## 2018-04-20 ENCOUNTER — Telehealth: Payer: Self-pay | Admitting: *Deleted

## 2018-04-20 DIAGNOSIS — G5621 Lesion of ulnar nerve, right upper limb: Secondary | ICD-10-CM | POA: Diagnosis not present

## 2018-04-20 NOTE — Telephone Encounter (Signed)
Left message for patient to call me back. 

## 2018-04-20 NOTE — Telephone Encounter (Signed)
-----   Message from [place], DO sent at 04/20/2018 11:47 AM EDT ----- Let pt know that EMG/NCV looked good.

## 2018-04-20 NOTE — Procedures (Signed)
[facility]  [address]  [place], [place] [address] Tel: [phone] Fax:  [phone] Test Date:  04/20/2018  Patient: Meredith Finley DOB: Jun 09, 1961 Physician: Narda Amber, DO  Sex: Female Height: 5\' 3"  Ref Phys: Alonza Bogus, DO  ID#: [id number] Temp: 36.0C Technician:    Patient Complaints: This is a 57 year old female s/p CTS release referred for evaluation of right hand numbness and tingling, worse over the last 2 digits.  NCV & EMG Findings: Extensive electrodiagnostic testing of the right upper extremity shows:  1. Right median and ulnar sensory responses are within normal limits. 2. Right median and ulnar motor responses are within normal limits. 3. There is no evidence of active or chronic motor axon loss changes affecting any of the tested muscles. Motor unit configuration and recruitment pattern is within normal limits.  Impression: This is a normal study of the right upper extremity. In particular, there is no evidence of an ulnar neuropathy or cervical radiculopathy.   ___________________________ Narda Amber, DO    Nerve Conduction Studies Anti Sensory Summary Table   Site NR Peak (ms) Norm Peak (ms) P-T Amp (V) Norm P-T Amp  Right Median Anti Sensory (2nd Digit)  36C  Wrist    2.6 <3.6 49.0 >15  Right Ulnar Anti Sensory (5th Digit)  36C  Wrist    2.0 <3.1 49.3 >10   Motor Summary Table   Site NR Onset (ms) Norm Onset (ms) O-P Amp (mV) Norm O-P Amp Site1 Site2 Delta-0 (ms) Dist (cm) Vel (m/s) Norm Vel (m/s)  Right Median Motor (Abd Poll Brev)  36C  Wrist    2.7 <4.0 9.3 >6 Elbow Wrist 4.6 28.0 61 >50  Elbow    7.3  9.2         Right Ulnar Motor (Abd Dig Minimi)  36C  Wrist    1.3 <3.1 9.8 >7 B Elbow Wrist 3.6 22.0 61 >50  B Elbow    4.9  9.7  A Elbow B Elbow 1.6 10.0 63 >50  A Elbow    6.5  9.5          EMG   Side Muscle Ins Act Fibs Psw Fasc Number Recrt Dur Dur. Amp Amp. Poly Poly. Comment  Right 1stDorInt Nml Nml Nml  Nml Nml Nml Nml Nml Nml Nml Nml Nml N/A  Right PronatorTeres Nml Nml Nml Nml Nml Nml Nml Nml Nml Nml Nml Nml N/A  Right Biceps Nml Nml Nml Nml Nml Nml Nml Nml Nml Nml Nml Nml N/A  Right Triceps Nml Nml Nml Nml Nml Nml Nml Nml Nml Nml Nml Nml N/A  Right Deltoid Nml Nml Nml Nml Nml Nml Nml Nml Nml Nml Nml Nml N/A  Right ABD Dig Min Nml Nml Nml Nml Nml Nml Nml Nml Nml Nml Nml Nml N/A  Right FlexCarpiUln Nml Nml Nml Nml Nml Nml Nml Nml Nml Nml Nml Nml N/A      Waveforms:

## 2018-04-21 ENCOUNTER — Telehealth: Payer: Self-pay | Admitting: *Deleted

## 2018-04-21 NOTE — Telephone Encounter (Signed)
-----   Message from [place], DO sent at 04/20/2018 11:47 AM EDT ----- Let pt know that EMG/NCV looked good.

## 2018-04-21 NOTE — Telephone Encounter (Signed)
Patient notified

## 2018-04-22 ENCOUNTER — Telehealth: Payer: Self-pay | Admitting: Neurology

## 2018-04-22 NOTE — Telephone Encounter (Signed)
-----   Message from [place], DO sent at 04/20/2018  7:33 AM EDT ----- Let pt know that b12 level normal

## 2018-04-22 NOTE — Telephone Encounter (Signed)
Mychart message sent to patient.

## 2018-04-28 ENCOUNTER — Encounter: Payer: Self-pay | Admitting: Gastroenterology

## 2018-05-03 NOTE — Progress Notes (Signed)
HPI: FU PAF. Monitor December 2016 showed sinus rhythm with paroxysmal atrial fibrillation. Echocardiogram April 2019 showed normal LV function and mild left atrial enlargement.  Exercise treadmill April 2019 with borderline changes. Monitor April 2019 showed sinus with PAC and PVC.  Nuclear study May 2019 showed no ischemia and ejection fraction 73%.  Since last seen, the patient denies any dyspnea on exertion, orthopnea, PND, pedal edema, palpitations, syncope or chest pain.   Current Outpatient Medications  Medication Sig Dispense Refill  . aspirin EC 81 MG tablet Take 81 mg by mouth daily.    . bisoprolol (ZEBETA) 5 MG tablet 1/2 tablet daily 15 tablet 5  . Cholecalciferol (VITAMIN D3) 2000 units capsule Take 2,000 Units by mouth daily.    . Cyanocobalamin (VITAMIN B-12 PO) Take 1,500 mcg by mouth daily.    Marland Kitchen estradiol (ESTRACE) 1 MG tablet Take 1 mg by mouth daily.    . indomethacin (INDOCIN SR) 75 MG CR capsule Take 1 capsule (75 mg total) by mouth daily. 30 capsule 0  . Magnesium 250 MG TABS Take by mouth.    . Melatonin-Pyridoxine (MELATIN PO) Take 5 mg by mouth as needed.    . metFORMIN (GLUCOPHAGE-XR) 500 MG 24 hr tablet Take 1 tablet (500 mg total) by mouth daily with breakfast. 90 tablet 0  . Multiple Vitamin (MULTIVITAMIN) tablet Take 1 tablet by mouth daily.     No current facility-administered medications for this visit.      Past Medical History:  Diagnosis Date  . Endometriosis 1996  . GERD (gastroesophageal reflux disease)   . H/O osteopenia   . HLD (hyperlipidemia)   . Hx of varicella   . NEPHROLITHIASIS 02/22/2009   Qualifier: Diagnosis of  By: Burt Knack CMA, Cecille Rubin    . Other specified congenital anomaly of kidney   . Palpitations   . Prediabetes   . Transfusion history    placental abruption 1986  . Tremor     Past Surgical History:  Procedure Laterality Date  . ABDOMINAL HYSTERECTOMY    . ABDOMINAL SURGERY     for congenital kidney abnormality  .  CARPAL TUNNEL RELEASE    . CESAREAN SECTION  [id number]  . LAPAROSCOPIC SALPINGO OOPHERECTOMY    . LITHOTRIPSY  1992-1996   multiple  . NOSE SURGERY    . surgical fusion disk 5& 6      Social History   Socioeconomic History  . Marital status: Married    Spouse name: Not on file  . Number of children: 2  . Years of education: Not on file  . Highest education level: Not on file  Occupational History  . Occupation: Psychiatrist: [employer]  Social Needs  . Financial resource strain: Not on file  . Food insecurity:    Worry: Not on file    Inability: Not on file  . Transportation needs:    Medical: Not on file    Non-medical: Not on file  Tobacco Use  . Smoking status: Never Smoker  . Smokeless tobacco: Never Used  Substance and Sexual Activity  . Alcohol use: No  . Drug use: No  . Sexual activity: Yes    Birth control/protection: Surgical  Lifestyle  . Physical activity:    Days per week: Not on file    Minutes per session: Not on file  . Stress: Not on file  Relationships  . Social connections:    Talks on phone: Not on  file    Gets together: Not on file    Attends religious service: Not on file    Active member of club or organization: Not on file    Attends meetings of clubs or organizations: Not on file    Relationship status: Not on file  . Intimate partner violence:    Fear of current or ex partner: Not on file    Emotionally abused: Not on file    Physically abused: Not on file    Forced sexual activity: Not on file  Other Topics Concern  . Not on file  Social History Narrative   Usually gets 9 hours of sleep per night   4 people living in the home.   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 Cancer Mother        Colon cancer, breast cancer and lung cancer  . Tremor Mother   . Colon cancer Mother   . Lung  cancer Mother        never smoked or around cig smoke  . Arthritis Father   . Vision loss Father        Macular degeneration  . Scoliosis Father   . Lymphoma Brother 31  . Obesity Brother   . Hashimoto's thyroiditis Daughter   . Osteoporosis Maternal Grandmother   . Esophageal cancer Neg Hx   . Rectal cancer Neg Hx   . Stomach cancer Neg Hx     ROS: no fevers or chills, productive cough, hemoptysis, dysphasia, odynophagia, melena, hematochezia, dysuria, hematuria, rash, seizure activity, orthopnea, PND, pedal edema, claudication. Remaining systems are negative.  Physical Exam: Well-developed well-nourished in no acute distress.  Skin is warm and dry.  HEENT is normal.  Neck is supple.  Chest is clear to auscultation with normal expansion.  Cardiovascular exam is regular rate and rhythm.  Abdominal exam nontender or distended. No masses palpated. Extremities show no edema. neuro grossly intact   A/P  1 paroxysmal atrial fibrillation-patient is in sinus rhythm today.  Patient's only embolic risk factor is female sex (she states she has not been diagnosed with diabetes mellitus but is on metformin because she is prediabetic).  I have therefore not anticoagulated.  Continue low-dose bisoprolol.  We can consider an antiarrhythmic in the future if symptoms become more frequent.  2 chest tightness-previous symptoms atypical and she has had no further chest pain.  Functional study negative.  No plans for further ischemia evaluation.  3 hyperlipidemia-review of recent laboratories shows total cholesterol 199, triglycerides 448 and HDL 35.  I will refer to lipid clinic.  Kirk Ruths, MD

## 2018-05-06 LAB — LIPID PANEL
CHOLESTEROL TOTAL: 199 mg/dL (ref 100–199)
Chol/HDL Ratio: 5.7 ratio — ABNORMAL HIGH (ref 0.0–4.4)
HDL: 35 mg/dL — ABNORMAL LOW (ref >39)
Triglycerides: 448 mg/dL — ABNORMAL HIGH (ref 0–149)

## 2018-05-12 ENCOUNTER — Ambulatory Visit (INDEPENDENT_AMBULATORY_CARE_PROVIDER_SITE_OTHER): Payer: BLUE CROSS/BLUE SHIELD | Admitting: Cardiology

## 2018-05-12 ENCOUNTER — Encounter: Payer: Self-pay | Admitting: Cardiology

## 2018-05-12 VITALS — BP 100/58 | HR 68 | Ht 63.0 in | Wt 189.4 lb

## 2018-05-12 DIAGNOSIS — E7800 Pure hypercholesterolemia, unspecified: Secondary | ICD-10-CM | POA: Diagnosis not present

## 2018-05-12 DIAGNOSIS — I480 Paroxysmal atrial fibrillation: Secondary | ICD-10-CM | POA: Diagnosis not present

## 2018-05-12 DIAGNOSIS — R072 Precordial pain: Secondary | ICD-10-CM

## 2018-05-12 NOTE — Patient Instructions (Signed)
Your physician wants you to follow-up in: [place] will receive a reminder letter in the mail two months in advance. If you don't receive a letter, please call our office to schedule the follow-up appointment.   If you need a refill on your cardiac medications before your next appointment, please call your pharmacy.

## 2018-05-28 ENCOUNTER — Encounter: Payer: Self-pay | Admitting: Cardiology

## 2018-06-01 ENCOUNTER — Other Ambulatory Visit: Payer: Self-pay | Admitting: *Deleted

## 2018-06-01 MED ORDER — BISOPROLOL FUMARATE 5 MG PO TABS: ORAL_TABLET | ORAL | 11 refills | Status: DC

## 2018-06-02 ENCOUNTER — Ambulatory Visit (AMBULATORY_SURGERY_CENTER): Payer: Self-pay

## 2018-06-02 VITALS — Ht 63.5 in | Wt 194.6 lb

## 2018-06-02 DIAGNOSIS — Z86010 Personal history of colonic polyps: Secondary | ICD-10-CM

## 2018-06-02 MED ORDER — PEG-KCL-NACL-NASULF-NA ASC-C 140 G PO SOLR: 1.0000 | Freq: Once | ORAL | 0 refills | Status: AC

## 2018-06-02 NOTE — Progress Notes (Signed)
Denies allergies to eggs or soy products. Denies complication of anesthesia or sedation. Denies use of weight loss medication. Denies use of O2.   Emmi instructions declined.   Patient was given a universal coupon for Plenvu to help with the cost of the prep.

## 2018-06-08 ENCOUNTER — Ambulatory Visit (INDEPENDENT_AMBULATORY_CARE_PROVIDER_SITE_OTHER): Payer: BLUE CROSS/BLUE SHIELD | Admitting: Pharmacist Clinician (PhC)/ Clinical Pharmacy Specialist

## 2018-06-08 DIAGNOSIS — E7800 Pure hypercholesterolemia, unspecified: Secondary | ICD-10-CM | POA: Diagnosis not present

## 2018-06-08 MED ORDER — FENOFIBRATE 145 MG PO TABS: 145.0000 mg | ORAL_TABLET | Freq: Every day | ORAL | 5 refills | Status: DC

## 2018-06-08 MED ORDER — OMEGA-3-ACID ETHYL ESTERS 1 G PO CAPS: 2.0000 g | ORAL_CAPSULE | Freq: Two times a day (BID) | ORAL | 5 refills | Status: DC

## 2018-06-08 NOTE — Patient Instructions (Addendum)
Start omega fish oil 2 capsules twice daily OR fenofibrate 145 mg once daily  Continue to eat healthy foods and avoid "white foods" whenever possible  Call to let us know which of the two medications you decide to take.  Kristin/Raquel at [phone]  We will repeat your cholesterol labs in 2-3 months and see you shortly after that.     Food Choices to Lower Your Triglycerides Triglycerides are a type of fat in your blood. High levels of triglycerides can increase the risk of heart disease and stroke. If your triglyceride levels are high, the foods you eat and your eating habits are very important. Choosing the right foods can help lower your triglycerides. What general guidelines do I need to follow?  Lose weight if you are overweight.  Limit or avoid alcohol.  Fill one half of your plate with vegetables and green salads.  Limit fruit to two servings a day. Choose fruit instead of juice.  Make one fourth of your plate whole grains. Look for the word "whole" as the first word in the ingredient list.  Fill one fourth of your plate with lean protein foods.  Enjoy fatty fish (such as salmon, mackerel, sardines, and tuna) three times a week.  Choose healthy fats.  Limit foods high in starch and sugar.  Eat more home-cooked food and less restaurant, buffet, and fast food.  Limit fried foods.  Cook foods using methods other than frying.  Limit saturated fats.  Check ingredient lists to avoid foods with partially hydrogenated oils (trans fats) in them. What foods can I eat? Grains Whole grains, such as whole wheat or whole grain breads, crackers, cereals, and pasta. Unsweetened oatmeal, bulgur, barley, quinoa, or brown rice. Corn or whole wheat flour tortillas. Vegetables Fresh or frozen vegetables (raw, steamed, roasted, or grilled). Green salads. Fruits All fresh, canned (in natural juice), or frozen fruits. Meat and Other Protein Products Ground beef (85% or leaner),  grass-fed beef, or beef trimmed of fat. Skinless chicken or [place]. Ground chicken or [place]. Pork trimmed of fat. All fish and seafood. Eggs. Dried beans, peas, or lentils. Unsalted nuts or seeds. Unsalted canned or dry beans. Dairy Low-fat dairy products, such as skim or 1% milk, 2% or reduced-fat cheeses, low-fat ricotta or cottage cheese, or plain low-fat yogurt. Fats and Oils Tub margarines without trans fats. Light or reduced-fat mayonnaise and salad dressings. Avocado. Safflower, olive, or canola oils. Natural peanut or almond butter. The items listed above may not be a complete list of recommended foods or beverages. Contact your dietitian for more options. What foods are not recommended? Grains White bread. White pasta. White rice. Cornbread. Bagels, pastries, and croissants. Crackers that contain trans fat. Vegetables White potatoes. Corn. Creamed or fried vegetables. Vegetables in a cheese sauce. Fruits Dried fruits. Canned fruit in light or heavy syrup. Fruit juice. Meat and Other Protein Products Fatty cuts of meat. Ribs, chicken wings, bacon, sausage, bologna, salami, chitterlings, fatback, hot dogs, bratwurst, and packaged luncheon meats. Dairy Whole or 2% milk, cream, half-and-half, and cream cheese. Whole-fat or sweetened yogurt. Full-fat cheeses. Nondairy creamers and whipped toppings. Processed cheese, cheese spreads, or cheese curds. Sweets and Desserts Corn syrup, sugars, honey, and molasses. Candy. Jam and jelly. Syrup. Sweetened cereals. Cookies, pies, cakes, donuts, muffins, and ice cream. Fats and Oils Butter, stick margarine, lard, shortening, ghee, or bacon fat. Coconut, palm kernel, or palm oils. Beverages Alcohol. Sweetened drinks (such as sodas, lemonade, and fruit drinks or punches). The items listed  above may not be a complete list of foods and beverages to avoid. Contact your dietitian for more information. This information is not intended to replace advice  given to you by your health care provider. Make sure you discuss any questions you have with your health care provider. Document Released: 08/14/2004 Document Revised: 04/03/2016 Document Reviewed: 08/31/2013 Elsevier Interactive Patient Education  2017 [employer].

## 2018-06-08 NOTE — Progress Notes (Signed)
06/09/2018 Meredith Finley June 10, 1961 [id number]   HPI:  Meredith Finley is a 57 y.o. female patient of Dr Stanford Breed, who presents today for a lipid clinic evaluation.  In addition to hyperlipidemia her medical history is significant for paroxysmal atrial fibrillation, GI reflux, pre-diabetes (prior gestational diabetes) and idiopathic stabbing migraines.    Today patient has no complaints about her current medications.  She notes that her triglycerides have always been "a little high", but that over the past 2-3 years they have steadily increased.  She has tried to alter her diet accordingly to avoid taking medications, but has not been overly successful.    Current Medications:  none  Cholesterol Goals:   LDL < 100, TG < 150 Intolerant/previously tried:  Psoriatic rash with most beta blockers  Niacin - flush  Family history:   Family notorious for not going to MDs;   Mother died at 24 from cancer  1 sibling died from [place] last year  1 sibling with morbid obesity  Others "healthy"  Diet:   No fried foods; incrrease trigs noted after used to eat out regularly; avoids that now; home cooked; rice, veggies, lean poultry, rare beef or pork; avoids pastas, potatoes; no fish  Exercise:    No regular exercise; walks some in spring and fall Labs:   04/2018:  TC 199, TG 448, HDL 35, LDL not calculated  Current Outpatient Medications  Medication Sig Dispense Refill  . aspirin EC 81 MG tablet Take 81 mg by mouth daily.    . bisoprolol (ZEBETA) 5 MG tablet 1/2 tablet daily 30 tablet 11  . Cholecalciferol (VITAMIN D3) 2000 units capsule Take 2,000 Units by mouth daily.    . Cyanocobalamin (VITAMIN B-12 PO) Take 1,500 mcg by mouth daily.    Marland Kitchen estradiol (ESTRACE) 1 MG tablet Take 1 mg by mouth daily.    . fenofibrate (TRICOR) 145 MG tablet Take 1 tablet (145 mg total) by mouth daily. 30 tablet 5  . indomethacin (INDOCIN SR) 75 MG CR capsule Take 1 capsule (75 mg total) by mouth daily. (Patient not  taking: Reported on 06/02/2018) 30 capsule 0  . Magnesium 250 MG TABS Take by mouth.    . Melatonin-Pyridoxine (MELATIN PO) Take 5 mg by mouth as needed.    . metFORMIN (GLUCOPHAGE-XR) 500 MG 24 hr tablet Take 1 tablet (500 mg total) by mouth daily with breakfast. 90 tablet 0  . Multiple Vitamin (MULTIVITAMIN) tablet Take 1 tablet by mouth daily.    Marland Kitchen omega-3 acid ethyl esters (LOVAZA) 1 g capsule Take 2 capsules (2 g total) by mouth 2 (two) times daily. 120 capsule 5   No current facility-administered medications for this visit.     Allergies  Allergen Reactions  . Bystolic [Nebivolol Hcl]     Rash   . Ivp Dye [Iodinated Diagnostic Agents] Hives  . Metoprolol Rash and Other (See Comments)    Fatigue,  . Niacin Nausea And Vomiting and Rash    Past Medical History:  Diagnosis Date  . A-fib ([place])   . Allergy   . Anxiety   . Asthma   . Cataract   . Diabetes mellitus without complication ([place])    Gestational Diabetes  . Endometriosis 1996  . GERD (gastroesophageal reflux disease)   . H/O osteopenia   . HLD (hyperlipidemia)   . Hx of varicella   . NEPHROLITHIASIS 02/22/2009   Qualifier: Diagnosis of  By: Burt Knack CMA, Cecille Rubin    . Osteopenia   .  Other specified congenital anomaly of kidney   . Palpitations   . Prediabetes   . Sleep apnea   . Transfusion history    placental abruption 1986  . Tremor     There were no vitals taken for this visit.   Hyperlipidemia Patient with elevated triglycerides, not currently on medication.   We had a long discussion on lifestyle and medication options.   She has a fairly good, low trig diet, so we will start her on medication at this time.  She would prefer the omega-3 (Lovaza) option, but not sure if will be covered by her insurance Dentist).  I have sent both that and fenofibrate prescriptions to her local pharmacy.  She was also give a week supply of Vascepa to be sure she could swallow the capsules.  She will determine from the  pharmacy which option is covered and then call us to let us know which she will take.  We will repeat labs in 3 months and have her back in the office at that time to review.     Tommy Medal PharmD CPP [facility]

## 2018-06-09 ENCOUNTER — Encounter: Payer: Self-pay | Admitting: Pharmacist Clinician (PhC)/ Clinical Pharmacy Specialist

## 2018-06-09 NOTE — Assessment & Plan Note (Signed)
Patient with elevated triglycerides, not currently on medication.   We had a long discussion on lifestyle and medication options.   She has a fairly good, low trig diet, so we will start her on medication at this time.  She would prefer the omega-3 (Lovaza) option, but not sure if will be covered by her insurance Dentist).  I have sent both that and fenofibrate prescriptions to her local pharmacy.  She was also give a week supply of Vascepa to be sure she could swallow the capsules.  She will determine from the pharmacy which option is covered and then call us to let us know which she will take.  We will repeat labs in 3 months and have her back in the office at that time to review.

## 2018-06-11 ENCOUNTER — Other Ambulatory Visit: Payer: Self-pay | Admitting: Internal Medicine

## 2018-06-17 ENCOUNTER — Ambulatory Visit (AMBULATORY_SURGERY_CENTER): Payer: BLUE CROSS/BLUE SHIELD | Admitting: Gastroenterology

## 2018-06-17 ENCOUNTER — Encounter: Payer: Self-pay | Admitting: Gastroenterology

## 2018-06-17 VITALS — BP 104/62 | HR 60 | Temp 98.6°F | Resp 11 | Ht 63.5 in | Wt 194.0 lb

## 2018-06-17 DIAGNOSIS — Z1211 Encounter for screening for malignant neoplasm of colon: Secondary | ICD-10-CM | POA: Diagnosis not present

## 2018-06-17 DIAGNOSIS — Z800 Family history of malignant neoplasm of digestive organs: Secondary | ICD-10-CM | POA: Diagnosis present

## 2018-06-17 MED ORDER — SODIUM CHLORIDE 0.9 % IV SOLN: 500.0000 mL | Freq: Once | INTRAVENOUS | Status: DC

## 2018-06-17 NOTE — Progress Notes (Signed)
Pt's states no medical or surgical changes since previsit or office visit. 

## 2018-06-17 NOTE — Op Note (Signed)
[place] Patient Name: Meredith Finley Procedure Date: 06/17/2018 1:26 PM MRN: [id number] Endoscopist: [place]. Loletha Carrow , MD Age: 57 Referring MD:  Date of Birth: 09-Dec-1960 Gender: Female Account #: [id number] Procedure:                Colonoscopy Indications:              Screening in patient at increased risk: Colorectal                            cancer in mother before age 55 Medicines:                Monitored Anesthesia Care Procedure:                Pre-Anesthesia Assessment:                           - Prior to the procedure, a History and Physical                            was performed, and patient medications and                            allergies were reviewed. The patient's tolerance of                            previous anesthesia was also reviewed. The risks                            and benefits of the procedure and the sedation                            options and risks were discussed with the patient.                            All questions were answered, and informed consent                            was obtained. Prior Anticoagulants: The patient has                            taken no previous anticoagulant or antiplatelet                            agents. ASA Grade Assessment: II - A patient with                            mild systemic disease. After reviewing the risks                            and benefits, the patient was deemed in                            satisfactory condition to undergo the procedure.  After obtaining informed consent, the colonoscope                            was passed under direct vision. Throughout the                            procedure, the patient's blood pressure, pulse, and                            oxygen saturations were monitored continuously. The                            Model PCF-H190DL [id number]) scope was introduced                            through the anus and advanced to  the the cecum,                            identified by appendiceal orifice and ileocecal                            valve. The colonoscopy was performed without                            difficulty. The patient tolerated the procedure                            well. The quality of the bowel preparation was                            excellent. The ileocecal valve, appendiceal                            orifice, and rectum were photographed. The quality                            of the bowel preparation was evaluated using the                            BBPS [facility] Bowel Preparation Scale) with scores                            of: Right Colon = 3, Transverse Colon = 3 and Left                            Colon = 3 (entire mucosa seen well with no residual                            staining, small fragments of stool or opaque                            liquid). The total BBPS score equals 9. The bowel  preparation used was Plenvu. Scope In: 1:33:47 PM Scope Out: 1:45:25 PM Scope Withdrawal Time: 0 hours 8 minutes 0 seconds  Total Procedure Duration: 0 hours 11 minutes 38 seconds  Findings:                 The perianal and digital rectal examinations were                            normal.                           The entire examined colon appeared normal on direct                            and retroflexion views. Complications:            No immediate complications. Estimated Blood Loss:     Estimated blood loss: none. Impression:               - The entire examined colon is normal on direct and                            retroflexion views.                           - No specimens collected. Recommendation:           - Patient has a contact number available for                            emergencies. The signs and symptoms of potential                            delayed complications were discussed with the                            patient. Return to  normal activities tomorrow.                            Written discharge instructions were provided to the                            patient.                           - Resume previous diet.                           - Continue present medications.                           - Repeat colonoscopy in 5 years for screening                            purposes. Marjie Chea L. Loletha Carrow, MD 06/17/2018 1:47:33 PM This report has been signed electronically.

## 2018-06-17 NOTE — Patient Instructions (Signed)
YOU HAD AN ENDOSCOPIC PROCEDURE TODAY AT THE [facility]:   Refer to the procedure report that was given to you for any specific questions about what was found during the examination.  If the procedure report does not answer your questions, please call your gastroenterologist to clarify.  If you requested that your care partner not be given the details of your procedure findings, then the procedure report has been included in a sealed envelope for you to review at your convenience later.  YOU SHOULD EXPECT: Some feelings of bloating in the abdomen. Passage of more gas than usual.  Walking can help get rid of the air that was put into your GI tract during the procedure and reduce the bloating. If you had a lower endoscopy (such as a colonoscopy or flexible sigmoidoscopy) you may notice spotting of blood in your stool or on the toilet paper. If you underwent a bowel prep for your procedure, you may not have a normal bowel movement for a few days.  Please Note:  You might notice some irritation and congestion in your nose or some drainage.  This is from the oxygen used during your procedure.  There is no need for concern and it should clear up in a day or so.  SYMPTOMS TO REPORT IMMEDIATELY:   Following lower endoscopy (colonoscopy or flexible sigmoidoscopy):  Excessive amounts of blood in the stool  Significant tenderness or worsening of abdominal pains  Swelling of the abdomen that is new, acute  Fever of 100F or higher  For urgent or emergent issues, a gastroenterologist can be reached at any hour by calling [phone].   DIET:  We do recommend a small meal at first, but then you may proceed to your regular diet.  Drink plenty of fluids but you should avoid alcoholic beverages for 24 hours.  MEDICATIONS: Continue present medications.  Please see handouts given to you by your recovery nurse.  ACTIVITY:  You should plan to take it easy for the rest of today and you should NOT  DRIVE or use heavy machinery until tomorrow (because of the sedation medicines used during the test).    FOLLOW UP: Our staff will call the number listed on your records the next business day following your procedure to check on you and address any questions or concerns that you may have regarding the information given to you following your procedure. If we do not reach you, we will leave a message.  However, if you are feeling well and you are not experiencing any problems, there is no need to return our call.  We will assume that you have returned to your regular daily activities without incident.  If any biopsies were taken you will be contacted by phone or by letter within the next 1-3 weeks.  Please call us at [phone] if you have not heard about the biopsies in 3 weeks.   Thank you for allowing us to provide for your healthcare needs today.   SIGNATURES/CONFIDENTIALITY: You and/or your care partner have signed paperwork which will be entered into your electronic medical record.  These signatures attest to the fact that that the information above on your After Visit Summary has been reviewed and is understood.  Full responsibility of the confidentiality of this discharge information lies with you and/or your care-partner. 

## 2018-06-17 NOTE — Progress Notes (Signed)
Report to PACU, RN, vss, BBS= Clear.  

## 2018-06-18 ENCOUNTER — Telehealth: Payer: Self-pay

## 2018-06-18 NOTE — Telephone Encounter (Signed)
  Follow up Call-  Call back number 06/17/2018  Post procedure Call Back phone  # [phone] hm  Permission to leave phone message Yes  Some recent data might be hidden     Patient questions:  Do you have a fever, pain , or abdominal swelling? No. Pain Score  0 *  Have you tolerated food without any problems? Yes.    Have you been able to return to your normal activities? Yes.    Do you have any questions about your discharge instructions: Diet   No. Medications  No. Follow up visit  No.  Do you have questions or concerns about your Care? No.  Actions: * If pain score is 4 or above: No action needed, pain <4.

## 2018-08-12 ENCOUNTER — Other Ambulatory Visit: Payer: Self-pay | Admitting: Pharmacist Clinician (PhC)/ Clinical Pharmacy Specialist

## 2018-08-12 DIAGNOSIS — E7800 Pure hypercholesterolemia, unspecified: Secondary | ICD-10-CM

## 2018-08-12 NOTE — Telephone Encounter (Signed)
Labs ordered for Nov lipid appointment

## 2018-09-01 ENCOUNTER — Telehealth: Payer: Self-pay | Admitting: *Deleted

## 2018-09-01 NOTE — Telephone Encounter (Signed)
Requesting Tdap update along with Flu vaccine on 09/06/18.  Is also requesting MMR booster or labs.  If patient is requesting MMR booster or lab work, if any of the following questions are answered YES then no booster is needed. If all answers are NO then an office visit will need to be scheduled to discuss plan.  1. Were you born before [birth year]? Born 1962 2. For those patients considered high risk (college students, healthcare workers, international travelers, and groups at increased risk during outbreaks) do you have documentation of 2 doses of measles and mumps virus - containing vaccine? Expecting first grandchild 3. For all other patients (those not considered high risk) do you have documentation of one dose of measles and mumps virus - containing vaccine? Vaccine as a child, no known booster 4. Do you have laboratory confirmation of past infection or had blood tests (titer) that show you are immune to measles, mumps, and rubella? No known labs

## 2018-09-01 NOTE — Telephone Encounter (Signed)
Copied from [place] [id number]. Topic: General - Inquiry >> Aug 31, 2018  5:15 PM Selinda Flavin B, [place] wrote: Reason for CRM: patient calling and is wanting to know if she could get MMR and tdap with her flu shot on Monday 09/06/18. Please advise.  CB#: [phone]

## 2018-09-02 NOTE — Telephone Encounter (Signed)
Yes she can get mmr if given at same time   otherwise can get measles igm serology to see if immune and if not then get  Mmr.

## 2018-09-03 NOTE — Telephone Encounter (Signed)
Spoke with patient, aware of recommendations. They are going to give the MMR more thought - vaccine vs labs.  Nothing further needed.

## 2018-09-06 ENCOUNTER — Ambulatory Visit (INDEPENDENT_AMBULATORY_CARE_PROVIDER_SITE_OTHER): Payer: BLUE CROSS/BLUE SHIELD

## 2018-09-06 DIAGNOSIS — Z23 Encounter for immunization: Secondary | ICD-10-CM

## 2018-09-06 DIAGNOSIS — Z789 Other specified health status: Secondary | ICD-10-CM

## 2018-09-07 DIAGNOSIS — Z23 Encounter for immunization: Secondary | ICD-10-CM

## 2018-09-07 NOTE — Addendum Note (Signed)
Addended by: Gwenyth Ober R on: 09/07/2018 08:58 AM   Modules accepted: Orders

## 2018-09-07 NOTE — Addendum Note (Signed)
Addended by: Gwenyth Ober R on: 09/07/2018 08:38 AM   Modules accepted: Orders

## 2018-09-11 ENCOUNTER — Other Ambulatory Visit: Payer: Self-pay | Admitting: Internal Medicine

## 2018-09-13 LAB — HEPATIC FUNCTION PANEL
ALK PHOS: 98 IU/L (ref 39–117)
ALT: 12 IU/L (ref 0–32)
AST: 14 IU/L (ref 0–40)
Albumin: 3.8 g/dL (ref 3.5–5.5)
Bilirubin Total: 0.4 mg/dL (ref 0.0–1.2)
Bilirubin, Direct: 0.1 mg/dL (ref 0.00–0.40)
Total Protein: 6.5 g/dL (ref 6.0–8.5)

## 2018-09-13 LAB — LIPID PANEL
CHOLESTEROL TOTAL: 181 mg/dL (ref 100–199)
Chol/HDL Ratio: 5.3 ratio — ABNORMAL HIGH (ref 0.0–4.4)
HDL: 34 mg/dL — ABNORMAL LOW (ref >39)
LDL CALC: 88 mg/dL (ref 0–99)
TRIGLYCERIDES: 295 mg/dL — AB (ref 0–149)
VLDL CHOLESTEROL CAL: 59 mg/dL — AB (ref 5–40)

## 2018-09-16 ENCOUNTER — Ambulatory Visit (INDEPENDENT_AMBULATORY_CARE_PROVIDER_SITE_OTHER): Payer: BLUE CROSS/BLUE SHIELD | Admitting: Pharmacist Clinician (PhC)/ Clinical Pharmacy Specialist

## 2018-09-16 DIAGNOSIS — E782 Mixed hyperlipidemia: Secondary | ICD-10-CM | POA: Diagnosis not present

## 2018-09-16 NOTE — Patient Instructions (Signed)
Re-start the fish oil with 1 capsule twice daily.  Try looking downward when you swallow the capsule.    We will send you a lab order in January to repeat the labs in February.   Food Choices to Lower Your Triglycerides Triglycerides are a type of fat in your blood. High levels of triglycerides can increase the risk of heart disease and stroke. If your triglyceride levels are high, the foods you eat and your eating habits are very important. Choosing the right foods can help lower your triglycerides. What general guidelines do I need to follow?  Lose weight if you are overweight.  Limit or avoid alcohol.  Fill one half of your plate with vegetables and green salads.  Limit fruit to two servings a day. Choose fruit instead of juice.  Make one fourth of your plate whole grains. Look for the word "whole" as the first word in the ingredient list.  Fill one fourth of your plate with lean protein foods.  Enjoy fatty fish (such as salmon, mackerel, sardines, and tuna) three times a week.  Choose healthy fats.  Limit foods high in starch and sugar.  Eat more home-cooked food and less restaurant, buffet, and fast food.  Limit fried foods.  Cook foods using methods other than frying.  Limit saturated fats.  Check ingredient lists to avoid foods with partially hydrogenated oils (trans fats) in them. What foods can I eat? Grains Whole grains, such as whole wheat or whole grain breads, crackers, cereals, and pasta. Unsweetened oatmeal, bulgur, barley, quinoa, or brown rice. Corn or whole wheat flour tortillas. Vegetables Fresh or frozen vegetables (raw, steamed, roasted, or grilled). Green salads. Fruits All fresh, canned (in natural juice), or frozen fruits. Meat and Other Protein Products Ground beef (85% or leaner), grass-fed beef, or beef trimmed of fat. Skinless chicken or [place]. Ground chicken or [place]. Pork trimmed of fat. All fish and seafood. Eggs. Dried beans, peas, or  lentils. Unsalted nuts or seeds. Unsalted canned or dry beans. Dairy Low-fat dairy products, such as skim or 1% milk, 2% or reduced-fat cheeses, low-fat ricotta or cottage cheese, or plain low-fat yogurt. Fats and Oils Tub margarines without trans fats. Light or reduced-fat mayonnaise and salad dressings. Avocado. Safflower, olive, or canola oils. Natural peanut or almond butter. The items listed above may not be a complete list of recommended foods or beverages. Contact your dietitian for more options. What foods are not recommended? Grains White bread. White pasta. White rice. Cornbread. Bagels, pastries, and croissants. Crackers that contain trans fat. Vegetables White potatoes. Corn. Creamed or fried vegetables. Vegetables in a cheese sauce. Fruits Dried fruits. Canned fruit in light or heavy syrup. Fruit juice. Meat and Other Protein Products Fatty cuts of meat. Ribs, chicken wings, bacon, sausage, bologna, salami, chitterlings, fatback, hot dogs, bratwurst, and packaged luncheon meats. Dairy Whole or 2% milk, cream, half-and-half, and cream cheese. Whole-fat or sweetened yogurt. Full-fat cheeses. Nondairy creamers and whipped toppings. Processed cheese, cheese spreads, or cheese curds. Sweets and Desserts Corn syrup, sugars, honey, and molasses. Candy. Jam and jelly. Syrup. Sweetened cereals. Cookies, pies, cakes, donuts, muffins, and ice cream. Fats and Oils Butter, stick margarine, lard, shortening, ghee, or bacon fat. Coconut, palm kernel, or palm oils. Beverages Alcohol. Sweetened drinks (such as sodas, lemonade, and fruit drinks or punches). The items listed above may not be a complete list of foods and beverages to avoid. Contact your dietitian for more information. This information is not intended to replace advice  given to you by your health care provider. Make sure you discuss any questions you have with your health care provider. Document Released: 08/14/2004 Document  Revised: 04/03/2016 Document Reviewed: 08/31/2013 Elsevier Interactive Patient Education  2017 [employer].

## 2018-09-16 NOTE — Progress Notes (Signed)
09/16/2018 Meredith Finley 12-31-1960 [id number]   HPI:  Meredith Finley is a 57 y.o. female patient of Dr Stanford Breed, who presents today for a lipid clinic evaluation.  In addition to hyperlipidemia her medical history is significant for paroxysmal atrial fibrillation, GI reflux, pre-diabetes (prior gestational diabetes) and idiopathic stabbing migraines.  She was seen by our lipid clinic in July and was given generic Lovaza for her triglycerides. She was also advised to make some dietary changes to help decrease these numbers.      Today she returns for follow up.  She took the [place] for only about 1 week then stopped, mainly due to tablet size.  Instead she did some internet research on lowering triglycerides with diet.  Since she was here last she has cut back on sugars, cutting almost all sweets from her diet.  She has also decreased pastas, breads and starches.  Switched to brown or wild rice.  So her 34% drop in triglycerides is due only to dietary changes.    We discussed this improvement today and I praised her on working so hard on her diet.    Current Medications:  none  Cholesterol Goals:   LDL < 100, TG < 150  Intolerant/previously tried:  Psoriatic rash with most beta blockers  Niacin - flush  Family history:   Family notorious for not going to MDs;   Mother died at 29 from cancer (colon)  1 sibling died from Dana last year  1 sibling with morbid obesity  Others "healthy"  Diet:   No fried foods; incrrease trigs noted after used to eat out regularly; avoids that now; home cooked; rice, veggies, lean poultry, rare beef or pork; avoids pastas, potatoes; no fish.  Since her last visit has cut out carbs and sugars  Exercise:    No regular exercise; walks some in spring and fall Labs:   04/2018:  TC 199, TG 448, HDL 35, LDL not calculated  09/2018:  TC 181, TG 295, HDL 34, LDL 88 (dietary changes!)  Current Outpatient Medications  Medication Sig Dispense Refill  . aspirin EC  81 MG tablet Take 81 mg by mouth daily.    . bisoprolol (ZEBETA) 5 MG tablet 1/2 tablet daily 30 tablet 11  . Cholecalciferol (VITAMIN D3) 2000 units capsule Take 2,000 Units by mouth daily.    . Cyanocobalamin (VITAMIN B-12 PO) Take 1,500 mcg by mouth daily.    Marland Kitchen estradiol (ESTRACE) 0.5 MG tablet Take by mouth.    . fenofibrate (TRICOR) 145 MG tablet Take 1 tablet (145 mg total) by mouth daily. (Patient not taking: Reported on 06/17/2018) 30 tablet 5  . indomethacin (INDOCIN SR) 75 MG CR capsule Take 1 capsule (75 mg total) by mouth daily. (Patient not taking: Reported on 06/02/2018) 30 capsule 0  . Magnesium 250 MG TABS Take by mouth.    . Melatonin-Pyridoxine (MELATIN PO) Take 5 mg by mouth as needed.    . metFORMIN (GLUCOPHAGE-XR) 500 MG 24 hr tablet TAKE 1 TABLET(500 MG) BY MOUTH DAILY WITH BREAKFAST 90 tablet 0  . Multiple Vitamin (MULTIVITAMIN) tablet Take 1 tablet by mouth daily.    Marland Kitchen omega-3 acid ethyl esters (LOVAZA) 1 g capsule Take 2 capsules (2 g total) by mouth 2 (two) times daily. 120 capsule 5   Current Facility-Administered Medications  Medication Dose Route Frequency Provider Last Rate Last Dose  . 0.9 %  sodium chloride infusion  500 mL Intravenous Once Doran Stabler, MD  Allergies  Allergen Reactions  . Bystolic [Nebivolol Hcl]     Rash   . Ioxaglate   . Ivp Dye [Iodinated Diagnostic Agents] Hives  . Metoprolol Rash and Other (See Comments)    Fatigue, soreactic rash  . Niacin Nausea And Vomiting and Rash    Past Medical History:  Diagnosis Date  . A-fib ([place])   . Allergy   . Anxiety   . Asthma   . Cataract   . Diabetes mellitus without complication ([place])    Gestational Diabetes  . Endometriosis 1996  . GERD (gastroesophageal reflux disease)   . H/O osteopenia   . HLD (hyperlipidemia)   . Hx of varicella   . NEPHROLITHIASIS 02/22/2009   Qualifier: Diagnosis of  By: Burt Knack CMA, Cecille Rubin    . Osteopenia   . Other specified congenital anomaly of  kidney   . Palpitations   . Prediabetes   . Sleep apnea   . Transfusion history    placental abruption 1986  . Tremor     There were no vitals taken for this visit.   Hyperlipidemia Patient with elevated triglycerides, now doing much better.  Her 34% drop is due entirely to dietary changes, cutting out sugars, starches and carbohydrates.  We discussed the benefits this has brought on and she was encouraged to continue with these changes.  I did ask her to re-start the Lovaza, at 1 capsule twice daily for now.  She has some issues with swallowing larger capsules, so was given some tricks to make it easier.  We will repeat labs in late January and see how she is doing.     Tommy Medal PharmD CPP [facility]

## 2018-09-16 NOTE — Assessment & Plan Note (Addendum)
Patient with elevated triglycerides, now doing much better.  Her 34% drop is due entirely to dietary changes, cutting out sugars, starches and carbohydrates.  We discussed the benefits this has brought on and she was encouraged to continue with these changes.  I did ask her to re-start the Lovaza, at 1 capsule twice daily for now.  She has some issues with swallowing larger capsules, so was given some tricks to make it easier.  We will repeat labs in late January and see how she is doing.

## 2019-05-03 ENCOUNTER — Telehealth: Payer: Self-pay | Admitting: *Deleted

## 2019-05-03 NOTE — Telephone Encounter (Signed)
Recall removed, Mrs.Achey's Insurance has changed to [facility] is out of network.

## 2019-07-19 ENCOUNTER — Other Ambulatory Visit: Payer: Self-pay | Admitting: Internal Medicine

## 2019-07-20 NOTE — Telephone Encounter (Signed)
Called pt to set up for a visit since we have not seen patient in over a year patient states that she has changed insurance and has to use [facility] system and cannot do visits with [place] pt will find somewhere else for refill

## 2020-05-29 ENCOUNTER — Ambulatory Visit: Payer: 59 | Admitting: Internal Medicine

## 2020-05-29 ENCOUNTER — Encounter: Payer: Self-pay | Admitting: Internal Medicine

## 2020-05-29 ENCOUNTER — Other Ambulatory Visit: Payer: Self-pay

## 2020-05-29 VITALS — BP 116/68 | HR 65 | Temp 97.3°F | Ht 63.0 in | Wt 186.2 lb

## 2020-05-29 DIAGNOSIS — L729 Follicular cyst of the skin and subcutaneous tissue, unspecified: Secondary | ICD-10-CM | POA: Diagnosis not present

## 2020-05-29 DIAGNOSIS — L089 Local infection of the skin and subcutaneous tissue, unspecified: Secondary | ICD-10-CM | POA: Diagnosis not present

## 2020-05-29 MED ORDER — DOXYCYCLINE HYCLATE 100 MG PO TABS: 100.0000 mg | ORAL_TABLET | Freq: Two times a day (BID) | ORAL | 0 refills | Status: DC

## 2020-05-29 NOTE — Progress Notes (Signed)
Chief Complaint  Patient presents with  . Mass    noticed mass on lower bottom of right breast, getting larger, has leaked white fluid    HPI: Meredith Finley 59 y.o. come in for SDA   Onset about  4 months ago    No sig   And then enlarged and white and creamy area   No oder about 6 weeks ago and now  Get angry  andinfected  This week  No fever  No other swelling  No hx of same . ROS: See pertinent positives and negatives per HPI.  Past Medical History:  Diagnosis Date  . A-fib ([place])   . Allergy   . Anxiety   . Asthma   . Cataract   . Diabetes mellitus without complication ([place])    Gestational Diabetes  . Endometriosis 1996  . GERD (gastroesophageal reflux disease)   . H/O osteopenia   . HLD (hyperlipidemia)   . Hx of varicella   . NEPHROLITHIASIS 02/22/2009   Qualifier: Diagnosis of  By: Burt Knack CMA, Cecille Rubin    . Osteopenia   . Other specified congenital anomaly of kidney   . Palpitations   . Prediabetes   . Sleep apnea   . Transfusion history    placental abruption 1986  . Tremor     Family History  Problem Relation Age of Onset  . Cancer Mother        Colon cancer, breast cancer and lung cancer  . Tremor Mother   . Colon cancer Mother 32       died at 62  . Lung cancer Mother        never smoked or around cig smoke  . Arthritis Father   . Vision loss Father        Macular degeneration  . Scoliosis Father   . Lymphoma Brother 19  . Obesity Brother   . Hashimoto's thyroiditis Daughter   . Osteoporosis Maternal Grandmother   . Esophageal cancer Neg Hx   . Rectal cancer Neg Hx   . Stomach cancer Neg Hx     Social History   Socioeconomic History  . Marital status: Married    Spouse name: Not on file  . Number of children: 2  . Years of education: Not on file  . Highest education level: Not on file  Occupational History  . Occupation: Psychiatrist: [employer]  Tobacco Use  . Smoking status: Never Smoker  . Smokeless tobacco: Never  Used  Vaping Use  . Vaping Use: Never used  Substance and Sexual Activity  . Alcohol use: No  . Drug use: No  . Sexual activity: Yes    Birth control/protection: Surgical, Post-menopausal  Other Topics Concern  . Not on file  Social History Narrative   Usually gets 9 hours of sleep per night   4 people living in the home.   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 Difficulty of Paying Living Expenses:   Food Insecurity:   . Worried About Charity fundraiser in the Last Year:   . Arboriculturist in the Last Year:   Transportation Needs:   . Film/video editor (Medical):   Marland Kitchen Lack of Transportation (Non-Medical):  Physical Activity:   . Days of Exercise per Week:   . Minutes of Exercise per Session:   Stress:   . Feeling of Stress :   Social Connections:   . Frequency of Communication with Friends and Family:   . Frequency of Social Gatherings with Friends and Family:   . Attends Religious Services:   . Active Member of Clubs or Organizations:   . Attends Archivist Meetings:   Marland Kitchen Marital Status:     Outpatient Medications Prior to Visit  Medication Sig Dispense Refill  . cetirizine (ZYRTEC) 10 MG tablet Take 10 mg by mouth daily.    . Cholecalciferol (VITAMIN D3) 2000 units capsule Take 2,000 Units by mouth daily.    . Cyanocobalamin (VITAMIN B-12 PO) Take 1,500 mcg by mouth daily.    Marland Kitchen estradiol (ESTRACE) 0.5 MG tablet Take by mouth.    . Magnesium 250 MG TABS Take by mouth.    . Melatonin-Pyridoxine (MELATIN PO) Take 5 mg by mouth as needed.    . metFORMIN (GLUCOPHAGE-XR) 500 MG 24 hr tablet TAKE 1 TABLET(500 MG) BY MOUTH DAILY WITH BREAKFAST 90 tablet 0  . Multiple Vitamin (MULTIVITAMIN) tablet Take 1 tablet by mouth daily.    Marland Kitchen aspirin EC 81 MG tablet Take 81 mg by mouth daily. (Patient not taking: Reported on 05/29/2020)     . bisoprolol (ZEBETA) 5 MG tablet 1/2 tablet daily (Patient not taking: Reported on 05/29/2020) 30 tablet 11  . indomethacin (INDOCIN SR) 75 MG CR capsule Take 1 capsule (75 mg total) by mouth daily. (Patient not taking: Reported on 06/02/2018) 30 capsule 0  . omega-3 acid ethyl esters (LOVAZA) 1 g capsule Take 2 capsules (2 g total) by mouth 2 (two) times daily. (Patient not taking: Reported on 05/29/2020) 120 capsule 5   Facility-Administered Medications Prior to Visit  Medication Dose Route Frequency Provider Last Rate Last Admin  . 0.9 %  sodium chloride infusion  500 mL Intravenous Once Nelida Meuse III, MD         EXAM:  BP 116/68   Pulse 65   Temp (!) 97.3 F (36.3 C) (Oral)   Ht 5\' 3"  (1.6 m)   Wt 186 lb 3.2 oz (84.5 kg)   SpO2 98%   BMI 32.98 kg/m   Body mass index is 32.98 kg/m.  GENERAL: vitals reviewed and listed above, alert, oriented, appears well hydrated and in no acute distress HEENT: atraumatic, conjunctiva  clear, no obvious abnormalities on inspection of external nose and ears OPmasked NECK: no obvious masses on inspection palpation   Breast exam   Symmetrical no nodules but  Inferior  Crease right  There is a 1- 1.5 cm inflammed nodule   No fluctuance   No breast nodules   Axilla clear  MS: moves all extremities without noticeable focal  abnormality PSYCH: pleasant and cooperative, no obvious depression or anxiety Lab Results  Component Value Date   WBC 10.8 (H) 02/16/2018   HGB 13.8 02/16/2018   HCT 40.5 02/16/2018   PLT 290.0 02/16/2018   GLUCOSE 114 (H) 02/16/2018   CHOL 181 09/13/2018   TRIG 295 (H) 09/13/2018   HDL 34 (L) 09/13/2018   LDLDIRECT 99.0 02/16/2018   LDLCALC 88 09/13/2018   ALT 12 09/13/2018   AST 14 09/13/2018   NA 139 02/16/2018   K 4.0 02/16/2018   CL 101 02/16/2018   CREATININE 0.61 02/16/2018   BUN 10 02/16/2018   CO2 32 02/16/2018  TSH 1.00 02/16/2018   HGBA1C 5.7 02/16/2018   BP Readings from Last 3 Encounters:    05/29/20 116/68  06/17/18 104/62  05/12/18 (!) 100/58    ASSESSMENT AND PLAN:  Discussed the following assessment and plan:  Skin infection  Infected cyst of skin ? This is probably infected cyst not  Breast tissue  But skin related  At this time add antibiotic warm compresses  If need may need I and D  If gets larger and not responding to  Conservative measures  She is utd on mammo and has  had covid vaccine  -Patient advised to return or notify health care team  if  new concerns arise.  Patient Instructions  This acts like a small infected cyst . Warm compresses  2-3 x per day    And add antibiotic   Sometimes may be helped  By opening and trying to drain it     Let us know  If not improving after treatment and can do follow up.        [name]. Anthonie Lotito M.D.

## 2020-05-29 NOTE — Patient Instructions (Addendum)
This acts like a small infected cyst . Warm compresses  2-3 x per day    And add antibiotic   Sometimes may be helped  By opening and trying to drain it     Let us know  If not improving after treatment and can do follow up.

## 2021-01-10 NOTE — Progress Notes (Signed)
Virtual Visit via Video Note changed to phone visit at patient request.   This visit type was conducted due to national recommendations for restrictions regarding the COVID-19 Pandemic (e.g. social distancing) in an effort to limit this patient's exposure and mitigate transmission in our community.  Due to her co-morbid illnesses, this patient is at least at moderate risk for complications without adequate follow up.  This format is felt to be most appropriate for this patient at this time.  All issues noted in this document were discussed and addressed.  A limited physical exam was performed with this format.  Please refer to the patient's chart for her consent to telehealth for [facility].      Date:  01/22/2021   ID:  Meredith Finley, DOB 1961-05-29, MRN [id number]  Patient Location:Home Provider Location: Home  PCP:  Burnis Medin, MD  Cardiologist:  Dr Stanford Breed  Evaluation Performed:  Follow-Up Visit  Chief Complaint:  FU atrial fibrillation   History of Present Illness:    FU PAF. Monitor December 2016 showed sinus rhythm with paroxysmal atrial fibrillation. Echocardiogram April 2019 showed normal LV function and mild left atrial enlargement. Exercise treadmill April 2019 with borderline changes. Monitor April 2019 showed sinus with PAC and PVC. Nuclear study May 2019 showed no ischemia and ejection fraction 73%. Since last seen, on March 1 she had neck, shoulder, chest and back pain for 2 hours.  She was concerned her blood pressure increased as well.  She has had no symptoms since then and denies exertional chest pain.  Occasional mild dyspnea on exertion but no orthopnea, PND or pedal edema.  Occasional elevated heart rate but no recurrent atrial fibrillation.  The patient does not have symptoms concerning for COVID-19 infection (fever, chills, cough, or new shortness of breath).    Past Medical History:  Diagnosis Date  . A-fib ([place])   . Allergy   . Anxiety   . Asthma    . Cataract   . Diabetes mellitus without complication ([place])    Gestational Diabetes  . Endometriosis 1996  . GERD (gastroesophageal reflux disease)   . H/O osteopenia   . HLD (hyperlipidemia)   . Hx of varicella   . NEPHROLITHIASIS 02/22/2009   Qualifier: Diagnosis of  By: Burt Knack CMA, Cecille Rubin    . Osteopenia   . Other specified congenital anomaly of kidney   . Palpitations   . Prediabetes   . Sleep apnea   . Transfusion history    placental abruption 1986  . Tremor    Past Surgical History:  Procedure Laterality Date  . ABDOMINAL HYSTERECTOMY    . ABDOMINAL SURGERY     for congenital kidney abnormality  . CARPAL TUNNEL RELEASE    . CESAREAN SECTION  [id number]  . LAPAROSCOPIC SALPINGO OOPHERECTOMY    . LITHOTRIPSY  1992-1996   multiple  . NOSE SURGERY    . surgical fusion disk 5& 6       Current Meds  Medication Sig  . cetirizine (ZYRTEC) 10 MG tablet Take 10 mg by mouth daily.  . Cholecalciferol (VITAMIN D3) 2000 units capsule Take 2,000 Units by mouth daily.  . Cyanocobalamin (VITAMIN B-12 PO) Take 1,500 mcg by mouth daily.  Marland Kitchen estradiol (ESTRACE) 0.5 MG tablet Take by mouth.  . indomethacin (INDOCIN SR) 75 MG CR capsule Take 1 capsule (75 mg total) by mouth daily. (Patient taking differently: Take 75 mg by mouth daily as needed.)  . Magnesium 250 MG TABS Take  by mouth.  . Melatonin-Pyridoxine (MELATIN PO) Take 5 mg by mouth as needed.  . metFORMIN (GLUCOPHAGE-XR) 500 MG 24 hr tablet TAKE 1 TABLET(500 MG) BY MOUTH DAILY WITH BREAKFAST  . Multiple Vitamin (MULTIVITAMIN) tablet Take 1 tablet by mouth daily.   Current Facility-Administered Medications for the 01/22/21 encounter (Video Visit) with Lelon Perla, MD  Medication  . 0.9 %  sodium chloride infusion     Allergies:   Other, Bystolic [nebivolol hcl], Ioxaglate, Ivp dye [iodinated diagnostic agents], Metoprolol, and Niacin   Social History   Tobacco Use  . Smoking status: Never Smoker  . Smokeless  tobacco: Never Used  Vaping Use  . Vaping Use: Never used  Substance Use Topics  . Alcohol use: No  . Drug use: No     Family Hx: The patient's family history includes Arthritis in her father; Cancer in her mother; Colon cancer (age of onset: 30) in her mother; Hashimoto's thyroiditis in her daughter; Lung cancer in her mother; Lymphoma (age of onset: 69) in her brother; Obesity in her brother; Osteoporosis in her maternal grandmother; Scoliosis in her father; Tremor in her mother; Vision loss in her father. There is no history of Esophageal cancer, Rectal cancer, or Stomach cancer.  ROS:   Please see the history of present illness.    No Fever, chills  or productive cough All other systems reviewed and are negative.   Recent Lipid Panel Lab Results  Component Value Date/Time   CHOL 204 (H) 01/14/2021 04:04 PM   CHOL 181 09/13/2018 10:06 AM   TRIG 528.0 (H) 01/14/2021 04:04 PM   HDL 39.30 01/14/2021 04:04 PM   HDL 34 (L) 09/13/2018 10:06 AM   CHOLHDL 5 01/14/2021 04:04 PM   LDLCALC 88 09/13/2018 10:06 AM   LDLDIRECT 110.0 01/14/2021 04:04 PM    Wt Readings from Last 3 Encounters:  01/22/21 182 lb (82.6 kg)  01/14/21 187 lb 3.2 oz (84.9 kg)  05/29/20 186 lb 3.2 oz (84.5 kg)     Objective:    Vital Signs:  BP 118/76   Ht 5\' 3"  (1.6 m)   Wt 182 lb (82.6 kg)   BMI 32.24 kg/m    VITAL SIGNS:  reviewed NAD Answers questions appropriately Normal affect Remainder of physical examination not performed (telehealth visit; coronavirus pandemic)  ASSESSMENT & PLAN:    1. PAF-remains in sinus based on history.  She did not tolerate beta-blockers due to rash.  Can add Cardizem in the future for rate control if atrial fibrillation recurs.  CHADSvasc 1 for female sex.  I have therefore not anticoagulated.  She is on Metformin but has not been diagnosed with diabetes mellitus by her report.  I explained that if she had does have diabetes mellitus in the future anticoagulation would  be recommended as her chads vas score would be 2.  Consider antiarrhythmic in the future if needed. 2. H/O CP-patient describes an episode of pain in her neck, shoulder, back and chest on March 1 that lasted 2 hours and described as sharp pain.  However she has not had exertional chest pain.  I will have her come to the office for an ECG.  If unchanged we will follow expectantly. 3. Hyperlipidemia-per primary care  COVID-19 Education: The importance of social distancing was discussed today.  Time:   Today, I have spent 15 minutes with the patient with telehealth technology discussing the above problems.     Medication Adjustments/Labs and Tests Ordered: Current medicines are  reviewed at length with the patient today.  Concerns regarding medicines are outlined above.   Tests Ordered: No orders of the defined types were placed in this encounter.   Medication Changes: No orders of the defined types were placed in this encounter.   Follow Up:  In Person in 1 year(s)  Signed, Kirk Ruths, MD  01/22/2021 9:08 AM    [place]

## 2021-01-14 ENCOUNTER — Other Ambulatory Visit: Payer: Self-pay

## 2021-01-14 ENCOUNTER — Encounter: Payer: Self-pay | Admitting: Internal Medicine

## 2021-01-14 ENCOUNTER — Ambulatory Visit (INDEPENDENT_AMBULATORY_CARE_PROVIDER_SITE_OTHER): Payer: 59 | Admitting: Internal Medicine

## 2021-01-14 VITALS — BP 112/68 | HR 78 | Temp 97.9°F | Ht 63.0 in | Wt 187.2 lb

## 2021-01-14 DIAGNOSIS — M542 Cervicalgia: Secondary | ICD-10-CM

## 2021-01-14 DIAGNOSIS — E8940 Asymptomatic postprocedural ovarian failure: Secondary | ICD-10-CM | POA: Diagnosis not present

## 2021-01-14 DIAGNOSIS — Z79890 Hormone replacement therapy (postmenopausal): Secondary | ICD-10-CM

## 2021-01-14 DIAGNOSIS — R5383 Other fatigue: Secondary | ICD-10-CM | POA: Diagnosis not present

## 2021-01-14 DIAGNOSIS — G479 Sleep disorder, unspecified: Secondary | ICD-10-CM

## 2021-01-14 DIAGNOSIS — R221 Localized swelling, mass and lump, neck: Secondary | ICD-10-CM | POA: Diagnosis not present

## 2021-01-14 DIAGNOSIS — L659 Nonscarring hair loss, unspecified: Secondary | ICD-10-CM

## 2021-01-14 NOTE — Patient Instructions (Signed)
Lab   today  depending  We will get ent to check your neck .   Then go from there.

## 2021-01-14 NOTE — Progress Notes (Signed)
Chief Complaint  Patient presents with  . Neck Pain    Knot in the neck for a year or two with on and off pain. Last week stabbing pain started, and keeps Patient up at night.  States she has a history of Afib and elevated blood pressure. Went to urgent care and they states the thyroid feels enlarged. Has family history of Lymph Cancer     HPI: Meredith Finley 60 y.o. come in for SDA  Stabbing pain  Left ant neck  Off and on  For a year and feell fullness and tenderness    Took  Asa  Persisted with tenderness     . Went to UC said see pcp  bp and pulse was up but usually  In range    This area  Tender for a year off and on then a week ago  Stabbing pain .  No change w eating swallowing.    utd dental   Check  Has hx of af rx  And had done well.  Also tired   And felt weak     Fam hx of  Lymphoma : Mother adenocarcinoma  .  Brother and  Materna aunt..passed from   Lymphoma   Worried about thryoid  Loss of hiar  Has lost some weigh ton purpose    Has fu dr Stanford Breed    Soon for hx of afib  ROS: See pertinent positives and negatives per HPI. No syncope but has felt nausea today( ate grits today)  Sleep  Disrupted   Has hd of  Surgical menopause  In her 29s  For large cysts and has been on hrt since then   Past Medical History:  Diagnosis Date  . A-fib ([place])   . Allergy   . Anxiety   . Asthma   . Cataract   . Diabetes mellitus without complication ([place])    Gestational Diabetes  . Endometriosis 1996  . GERD (gastroesophageal reflux disease)   . H/O osteopenia   . HLD (hyperlipidemia)   . Hx of varicella   . NEPHROLITHIASIS 02/22/2009   Qualifier: Diagnosis of  By: Burt Knack CMA, Cecille Rubin    . Osteopenia   . Other specified congenital anomaly of kidney   . Palpitations   . Prediabetes   . Sleep apnea   . Transfusion history    placental abruption 1986  . Tremor     Family History  Problem Relation Age of Onset  . Cancer Mother        Colon cancer, breast cancer and lung cancer  .  Tremor Mother   . Colon cancer Mother 93       died at 65  . Lung cancer Mother        never smoked or around cig smoke  . Arthritis Father   . Vision loss Father        Macular degeneration  . Scoliosis Father   . Lymphoma Brother 74  . Obesity Brother   . Hashimoto's thyroiditis Daughter   . Osteoporosis Maternal Grandmother   . Esophageal cancer Neg Hx   . Rectal cancer Neg Hx   . Stomach cancer Neg Hx     Social History   Socioeconomic History  . Marital status: Married    Spouse name: Not on file  . Number of children: 2  . Years of education: Not on file  . Highest education level: Not on file  Occupational History  . Occupation: Corporate treasurer  Employer: [employer]  Tobacco Use  . Smoking status: Never Smoker  . Smokeless tobacco: Never Used  Vaping Use  . Vaping Use: Never used  Substance and Sexual Activity  . Alcohol use: No  . Drug use: No  . Sexual activity: Yes    Birth control/protection: Surgical, Post-menopausal  Other Topics Concern  . Not on file  Social History Narrative   Usually gets 9 hours of sleep per night   4 people living in the home.   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 cetirizine (ZYRTEC) 10 MG tablet Take 10 mg by mouth daily.    . Cholecalciferol (VITAMIN D3) 2000 units capsule Take 2,000 Units by mouth daily.    . Cyanocobalamin (VITAMIN B-12 PO) Take 1,500 mcg by mouth daily.    Marland Kitchen estradiol (ESTRACE) 0.5 MG tablet Take by mouth.    . Magnesium 250 MG TABS Take by mouth.    . Melatonin-Pyridoxine (MELATIN PO) Take 5 mg by mouth as needed.    .  Multiple Vitamin (MULTIVITAMIN) tablet Take 1 tablet by mouth daily.    Marland Kitchen aspirin EC 81 MG tablet Take 81 mg by mouth daily. (Patient not taking: No sig reported)    . bisoprolol (ZEBETA) 5 MG tablet 1/2 tablet daily (Patient not taking: No sig reported) 30 tablet 11  . doxycycline (VIBRA-TABS) 100 MG tablet Take 1 tablet (100 mg total) by mouth 2 (two) times daily. (Patient not taking: Reported on 01/14/2021) 14 tablet 0  . indomethacin (INDOCIN SR) 75 MG CR capsule Take 1 capsule (75 mg total) by mouth daily. (Patient not taking: Reported on 01/14/2021) 30 capsule 0  . metFORMIN (GLUCOPHAGE-XR) 500 MG 24 hr tablet TAKE 1 TABLET(500 MG) BY MOUTH DAILY WITH BREAKFAST (Patient not taking: Reported on 01/14/2021) 90 tablet 0  . omega-3 acid ethyl esters (LOVAZA) 1 g capsule Take 2 capsules (2 g total) by mouth 2 (two) times daily. (Patient not taking: No sig reported) 120 capsule 5   Facility-Administered Medications Prior to Visit  Medication Dose Route Frequency Provider Last Rate Last Admin  . 0.9 %  sodium chloride infusion  500 mL Intravenous Once Doran Stabler, MD         EXAM:  BP 112/68 (BP Location: Left Arm, Patient Position: Sitting, Cuff Size: Large)   Pulse 78   Temp 97.9 F (36.6 C) (Oral)   Ht 5\' 3"  (1.6 m)   Wt 187 lb 3.2 oz (84.9 kg)   SpO2 96%   BMI 33.16 kg/m   Body mass index is 33.16 kg/m.  GENERAL: vitals reviewed and listed above, alert, oriented, appears well hydrated and in no acute distress HEENT: atraumatic, conjunctiva  clear, no obvious abnormalities on inspection of external nose and ears tms clear  OP : no lesion edema or exudate  NECK: no obvious masses on inspection  amall  Soft  Almond sized left paratracheal area   Min tender   SCM are is some tender  No bruit  Thyroid  no obv nodules  LUNGS: clear to auscultation bilaterally, no wheezes, rales or rhonchi, good air movement CV: HRRR, no clubbing cyanosis or  peripheral edema nl cap refill  Abdomen:   Sof,t normal bowel sounds without hepatosplenomegaly, no guarding rebound or masses no CVA tenderness MS: moves all extremities without noticeable focal  abnormality PSYCH: pleasant and cooperative, no obvious depression or anxiety Lab Results  Component Value Date   WBC 10.8 (H) 02/16/2018   HGB 13.8 02/16/2018   HCT 40.5 02/16/2018   PLT 290.0 02/16/2018   GLUCOSE 114 (H) 02/16/2018   CHOL 181 09/13/2018   TRIG 295 (H) 09/13/2018   HDL 34 (L) 09/13/2018   LDLDIRECT 99.0 02/16/2018   LDLCALC 88 09/13/2018   ALT 12 09/13/2018   AST 14 09/13/2018   NA 139 02/16/2018   K 4.0 02/16/2018   CL 101 02/16/2018   CREATININE 0.61 02/16/2018   BUN 10 02/16/2018   CO2 32 02/16/2018   TSH 1.00 02/16/2018   HGBA1C 5.7 02/16/2018   BP Readings from Last 3 Encounters:  01/14/21 112/68  05/29/20 116/68  06/17/18 104/62    ASSESSMENT AND PLAN:  Discussed the following assessment and plan:  Neck fullness - Plan: Basic metabolic panel, CBC with Differential/Platelet, Hemoglobin A1c, Hepatic function panel, Lipid panel, TSH, T4, free, C-reactive protein, Sedimentation rate, Estradiol, Estradiol, Basic metabolic panel, CBC with Differential/Platelet, Hemoglobin A1c, Hepatic function panel, Lipid panel, TSH, T4, free, C-reactive protein, Sedimentation rate  Surgical menopause on hormone replacement therapy - Plan: Basic metabolic panel, CBC with Differential/Platelet, Hemoglobin A1c, Hepatic function panel, Lipid panel, TSH, T4, free, C-reactive protein, Sedimentation rate, Estradiol, Estradiol, Basic metabolic panel, CBC with Differential/Platelet, Hemoglobin A1c, Hepatic function panel, Lipid panel, TSH, T4, free, C-reactive protein, Sedimentation rate  Other fatigue - Plan: Basic metabolic panel, CBC with Differential/Platelet, Hemoglobin A1c, Hepatic function panel, Lipid panel, TSH, T4, free, C-reactive protein, Sedimentation rate, Estradiol, Estradiol, Basic metabolic panel, CBC with  Differential/Platelet, Hemoglobin A1c, Hepatic function panel, Lipid panel, TSH, T4, free, C-reactive protein, Sedimentation rate  Sleep disturbance - Plan: Basic metabolic panel, CBC with Differential/Platelet, Hemoglobin A1c, Hepatic function panel, Lipid panel, TSH, T4, free, C-reactive protein, Sedimentation rate, Estradiol, Estradiol, Basic metabolic panel, CBC with Differential/Platelet, Hemoglobin A1c, Hepatic function panel, Lipid panel, TSH, T4, free, C-reactive protein, Sedimentation rate  Hair loss - Plan: Basic metabolic panel, CBC with Differential/Platelet, Hemoglobin A1c, Hepatic function panel, Lipid panel, TSH, T4, free, C-reactive protein, Sedimentation rate, Estradiol, Estradiol, Basic metabolic panel, CBC with Differential/Platelet, Hemoglobin A1c, Hepatic function panel, Lipid panel, TSH, T4, free, C-reactive protein, Sedimentation rate  Tenderness of neck - Plan: Basic metabolic panel, CBC with Differential/Platelet, Hemoglobin A1c, Hepatic function panel, Lipid panel, TSH, T4, free, C-reactive protein, Sedimentation rate, Estradiol, Estradiol, Basic metabolic panel, CBC with Differential/Platelet, Hemoglobin A1c, Hepatic function panel, Lipid panel, TSH, T4, free, C-reactive protein, Sedimentation rate Updated lab today   No alarming  Findings but sx  On going   And would do ent referral if lab unrevealing as to cause of problem  -Patient advised to return or notify health care team  if  new concerns arise.  Patient Instructions  Lab   today  depending  We will get ent to check your neck .   Then go from there.   [name]. Lonzie Simmer M.D.

## 2021-01-15 LAB — CBC WITH DIFFERENTIAL/PLATELET
Basophils Absolute: 0.1 10*3/uL (ref 0.0–0.1)
Basophils Relative: 1 % (ref 0.0–3.0)
Eosinophils Absolute: 0.2 10*3/uL (ref 0.0–0.7)
Eosinophils Relative: 2.5 % (ref 0.0–5.0)
HCT: 41.8 % (ref 36.0–46.0)
Hemoglobin: 14.3 g/dL (ref 12.0–15.0)
Lymphocytes Relative: 29.1 % (ref 12.0–46.0)
Lymphs Abs: 2.4 10*3/uL (ref 0.7–4.0)
MCHC: 34.1 g/dL (ref 30.0–36.0)
MCV: 92.4 fl (ref 78.0–100.0)
Monocytes Absolute: 0.5 10*3/uL (ref 0.1–1.0)
Monocytes Relative: 5.8 % (ref 3.0–12.0)
Neutro Abs: 5.2 10*3/uL (ref 1.4–7.7)
Neutrophils Relative %: 61.6 % (ref 43.0–77.0)
Platelets: 291 10*3/uL (ref 150.0–400.0)
RBC: 4.53 Mil/uL (ref 3.87–5.11)
RDW: 12.9 % (ref 11.5–15.5)
WBC: 8.4 10*3/uL (ref 4.0–10.5)

## 2021-01-15 LAB — BASIC METABOLIC PANEL
BUN: 10 mg/dL (ref 6–23)
CO2: 29 mEq/L (ref 19–32)
Calcium: 9.7 mg/dL (ref 8.4–10.5)
Chloride: 103 mEq/L (ref 96–112)
Creatinine, Ser: 0.67 mg/dL (ref 0.40–1.20)
GFR: 96.53 mL/min (ref >60.00)
Glucose, Bld: 104 mg/dL — ABNORMAL HIGH (ref 70–99)
Potassium: 4 mEq/L (ref 3.5–5.1)
Sodium: 139 mEq/L (ref 135–145)

## 2021-01-15 LAB — ESTRADIOL: Estradiol: 22 pg/mL

## 2021-01-15 LAB — HEPATIC FUNCTION PANEL
ALT: 14 U/L (ref 0–35)
AST: 16 U/L (ref 0–37)
Albumin: 4 g/dL (ref 3.5–5.2)
Alkaline Phosphatase: 107 U/L (ref 39–117)
Bilirubin, Direct: 0.1 mg/dL (ref 0.0–0.3)
Total Bilirubin: 0.6 mg/dL (ref 0.2–1.2)
Total Protein: 7.2 g/dL (ref 6.0–8.3)

## 2021-01-15 LAB — LIPID PANEL
Cholesterol: 204 mg/dL — ABNORMAL HIGH (ref 0–200)
HDL: 39.3 mg/dL (ref >39.00)
Total CHOL/HDL Ratio: 5
Triglycerides: 528 mg/dL — ABNORMAL HIGH (ref 0.0–149.0)

## 2021-01-15 LAB — SEDIMENTATION RATE: Sed Rate: 31 mm/hr — ABNORMAL HIGH (ref 0–30)

## 2021-01-15 LAB — HEMOGLOBIN A1C: Hgb A1c MFr Bld: 5.7 % (ref 4.6–6.5)

## 2021-01-15 LAB — LDL CHOLESTEROL, DIRECT: Direct LDL: 110 mg/dL

## 2021-01-15 LAB — C-REACTIVE PROTEIN: CRP: 1.1 mg/dL (ref 0.5–20.0)

## 2021-01-15 LAB — TSH: TSH: 1.22 u[IU]/mL (ref 0.35–4.50)

## 2021-01-15 LAB — T4, FREE: Free T4: 0.88 ng/dL (ref 0.60–1.60)

## 2021-01-15 NOTE — Progress Notes (Signed)
No diabetes kidney function liver normal triglycerides are way up as in past.  Thyroid is normal estrogen level in the postmenopausal range.  1 inflammatory marker is borderline elevated and this is very nonspecific. No defined explanation for your symptoms. Would you like [place] to go ahead and proceed with an ear nose and throat evaluation as we discussed at our visit?  If so please  have  team place the referral

## 2021-01-16 NOTE — Telephone Encounter (Signed)
Sure do not think it has anything to do with the area in your neck  neck.  Also in follow-up thyroid antibodies may be helpful if you are worried about Hashimoto's.  Usually does not cause pain fortunately.  Please refer to endocrinology as she requests for hair changes elevated triglycerides.  Family history of thyroid disease.  Also refer to  ENT for swelling and pain on left side of her neck

## 2021-01-17 ENCOUNTER — Other Ambulatory Visit: Payer: Self-pay

## 2021-01-17 DIAGNOSIS — M542 Cervicalgia: Secondary | ICD-10-CM

## 2021-01-17 DIAGNOSIS — L659 Nonscarring hair loss, unspecified: Secondary | ICD-10-CM

## 2021-01-22 ENCOUNTER — Encounter: Payer: Self-pay | Admitting: Cardiology

## 2021-01-22 ENCOUNTER — Telehealth (INDEPENDENT_AMBULATORY_CARE_PROVIDER_SITE_OTHER): Payer: 59 | Admitting: Cardiology

## 2021-01-22 VITALS — BP 118/76 | Ht 63.0 in | Wt 182.0 lb

## 2021-01-22 DIAGNOSIS — I480 Paroxysmal atrial fibrillation: Secondary | ICD-10-CM

## 2021-01-22 DIAGNOSIS — R072 Precordial pain: Secondary | ICD-10-CM | POA: Diagnosis not present

## 2021-01-22 DIAGNOSIS — E7800 Pure hypercholesterolemia, unspecified: Secondary | ICD-10-CM

## 2021-01-22 NOTE — Patient Instructions (Signed)

## 2021-01-23 ENCOUNTER — Other Ambulatory Visit: Payer: Self-pay

## 2021-01-23 DIAGNOSIS — Z8349 Family history of other endocrine, nutritional and metabolic diseases: Secondary | ICD-10-CM

## 2021-01-23 DIAGNOSIS — L659 Nonscarring hair loss, unspecified: Secondary | ICD-10-CM

## 2021-01-23 DIAGNOSIS — M542 Cervicalgia: Secondary | ICD-10-CM

## 2021-01-23 NOTE — Telephone Encounter (Signed)
Ok to refer to  Endocrine   Dr Hilbert Corrigan  [facility]   Per patient preference .    Please place referral

## 2021-01-23 NOTE — Telephone Encounter (Signed)
Referral to [facility] placed on 3/16. Patient has been notified that someone will contact her to set up an appt.

## 2021-01-24 ENCOUNTER — Other Ambulatory Visit: Payer: Self-pay

## 2021-01-24 ENCOUNTER — Ambulatory Visit (INDEPENDENT_AMBULATORY_CARE_PROVIDER_SITE_OTHER): Payer: 59 | Admitting: *Deleted

## 2021-01-24 VITALS — HR 70

## 2021-01-24 DIAGNOSIS — R072 Precordial pain: Secondary | ICD-10-CM

## 2021-01-24 NOTE — Progress Notes (Signed)
Patient presents for EKG nurse visit after VV with Dr. Stanford Breed 3/15.    EKG completed and reviewed by DOD, Dr. Debara Pickett.    No acute changes.

## 2021-02-05 ENCOUNTER — Ambulatory Visit (INDEPENDENT_AMBULATORY_CARE_PROVIDER_SITE_OTHER): Payer: 59 | Admitting: Otolaryngology

## 2021-02-22 ENCOUNTER — Encounter (INDEPENDENT_AMBULATORY_CARE_PROVIDER_SITE_OTHER): Payer: Self-pay | Admitting: Otolaryngology

## 2021-02-22 ENCOUNTER — Other Ambulatory Visit: Payer: Self-pay

## 2021-02-22 ENCOUNTER — Ambulatory Visit (INDEPENDENT_AMBULATORY_CARE_PROVIDER_SITE_OTHER): Payer: 59 | Admitting: Otolaryngology

## 2021-02-22 VITALS — Temp 97.7°F

## 2021-02-22 DIAGNOSIS — K115 Sialolithiasis: Secondary | ICD-10-CM

## 2021-02-22 NOTE — Progress Notes (Signed)
HPI: Meredith Finley is a 60 y.o. female who presents is referred by Dr. Regis Bill for evaluation of left neck nodule.  She apparently has had a nodule she noted in the left neck that goes up and down over the past several months.  It is doing better today.  Sometimes it is tender.  She has a family history of lymphoma..  She notices this on the left side underneath her chin just below the mandible. She also inquires about occasional episodes of benign positional vertigo that may occur and lasts for 10 to 15seconds.  Occurs only in certain positions.   Past Medical History:  Diagnosis Date  . A-fib ([place])   . Allergy   . Anxiety   . Asthma   . Cataract   . Diabetes mellitus without complication ([place])    Gestational Diabetes  . Endometriosis 1996  . GERD (gastroesophageal reflux disease)   . H/O osteopenia   . HLD (hyperlipidemia)   . Hx of varicella   . NEPHROLITHIASIS 02/22/2009   Qualifier: Diagnosis of  By: Burt Knack CMA, Cecille Rubin    . Osteopenia   . Other specified congenital anomaly of kidney   . Palpitations   . Prediabetes   . Sleep apnea   . Transfusion history    placental abruption 1986  . Tremor    Past Surgical History:  Procedure Laterality Date  . ABDOMINAL HYSTERECTOMY    . ABDOMINAL SURGERY     for congenital kidney abnormality  . CARPAL TUNNEL RELEASE    . CESAREAN SECTION  [id number]  . LAPAROSCOPIC SALPINGO OOPHERECTOMY    . LITHOTRIPSY  1992-1996   multiple  . NOSE SURGERY    . surgical fusion disk 5& 6     Social History   Socioeconomic History  . Marital status: Married    Spouse name: Not on file  . Number of children: 2  . Years of education: Not on file  . Highest education level: Not on file  Occupational History  . Occupation: Psychiatrist: [employer]  Tobacco Use  . Smoking status: Never Smoker  . Smokeless tobacco: Never Used  Vaping Use  . Vaping Use: Never used  Substance and Sexual Activity  . Alcohol use: No  . Drug use:  No  . Sexual activity: Yes    Birth control/protection: Surgical, Post-menopausal  Other Topics Concern  . Not on file  Social History Narrative   Usually gets 9 hours of sleep per night   4 people living in the home.   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 Cancer Mother        Colon cancer, breast cancer and lung cancer  . Tremor Mother   . Colon cancer Mother 52       died at 30  . Lung cancer Mother        never smoked or around cig smoke  . Arthritis Father   . Vision loss Father        Macular  degeneration  . Scoliosis Father   . Lymphoma Brother 28  . Obesity Brother   . Hashimoto's thyroiditis Daughter   . Osteoporosis Maternal Grandmother   . Esophageal cancer Neg Hx   . Rectal cancer Neg Hx   . Stomach cancer Neg Hx    Allergies  Allergen Reactions  . Other Other (See Comments) and Rash    Fatigue, Rash   . Bystolic [Nebivolol Hcl]     Rash   . Ioxaglate   . Ivp Dye [Iodinated Diagnostic Agents] Hives  . Metoprolol Rash and Other (See Comments)    Fatigue, soreactic rash  . Niacin Nausea And Vomiting and Rash   Prior to Admission medications   Medication Sig Start Date End Date Taking? Authorizing Provider  cetirizine (ZYRTEC) 10 MG tablet Take 10 mg by mouth daily.    [provider]  Cholecalciferol (VITAMIN D3) 2000 units capsule Take 2,000 Units by mouth daily.    [provider]  Cyanocobalamin (VITAMIN B-12 PO) Take 1,500 mcg by mouth daily.    [provider]  estradiol (ESTRACE) 0.5 MG tablet Take by mouth.    [provider]  indomethacin (INDOCIN SR) 75 MG CR  capsule Take 1 capsule (75 mg total) by mouth daily. Patient taking differently: Take 75 mg by mouth daily as needed. 04/19/18   Tat, Eustace Quail, DO  Magnesium 250 MG TABS Take by mouth.    [provider]  Melatonin-Pyridoxine (MELATIN PO) Take 5 mg by mouth as needed.    [provider]  metFORMIN (GLUCOPHAGE-XR) 500 MG 24 hr tablet TAKE 1 TABLET(500 MG) BY MOUTH DAILY WITH BREAKFAST 09/13/18   Panosh, [name], MD  Multiple Vitamin (MULTIVITAMIN) tablet Take 1 tablet by mouth daily.    [provider]     Positive ROS: Otherwise negative  All other systems have been reviewed and were otherwise negative with the exception of those mentioned in the HPI and as above.  Physical Exam: Constitutional: Alert, well-appearing, no acute distress Ears: External ears without lesions or tenderness. Ear canals are clear bilaterally with intact, clear TMs.  Nasal: External nose without lesions. Septum with minimal deformity.  Mild rhinitis.. Clear nasal passages Oral: Lips and gums without lesions. Tongue and palate mucosa without lesions. Posterior oropharynx clear.  Drainage from the submandibular ducts was clear bilaterally.  On bimanual palpation of the left floor of mouth and left submandibular gland she has what I feel is probable stones or calcification of the proximal duct toward the opening of the semitubular gland.  The gland is not tender today and not that enlarged compared to the right submandibular gland.  There is no palpable stones within the more distal duct.  Drainage from the opening of the duct was clear. Neck: No palpable adenopathy or masses.  The left submandible gland is slightly larger than the right but minimal difference.  No pain to palpation.  No adenopathy in the submental or submandibular region.  No adenopathy along the jugular chain of lymph nodes or in the supraclavicular or posterior accessory chain of lymph nodes. Respiratory: Breathing comfortably   Skin: No facial/neck lesions or rash noted.  Procedures  Assessment: Intermittent swelling of left submandibular gland.  Presently no signs of active infection although I suspect she does have sialolithiasis. No evidence of significant lymphadenopathy. She also reports history of BPPV.  Plan: Reassured her of no evidence of lymphoma as she is concerned about this as she has a family history  of this. Gave her information on the left submandibular gland.  Suggested drinking plenty of liquids and using sialagogues and may help.  If she develops any persistent swelling or pain over 2 to 3 days would recommend use of antibiotics and gave her a prescription for Keflex to take 500 mg 3 times daily for 1 week. She will follow up here for recheck if she has any recurrent swelling or problems with the gland. I also gave her information on BPPV and the Epley maneuver as she inquired about this.   Radene Journey, MD   CC:

## 2021-03-11 ENCOUNTER — Other Ambulatory Visit: Payer: Self-pay

## 2021-03-11 ENCOUNTER — Ambulatory Visit: Payer: 59 | Admitting: Endocrinology

## 2021-03-11 VITALS — BP 110/60 | HR 85 | Ht 63.0 in | Wt 186.6 lb

## 2021-03-11 DIAGNOSIS — R739 Hyperglycemia, unspecified: Secondary | ICD-10-CM | POA: Diagnosis not present

## 2021-03-11 DIAGNOSIS — E042 Nontoxic multinodular goiter: Secondary | ICD-10-CM

## 2021-03-11 LAB — HEMOGLOBIN A1C: Hgb A1c MFr Bld: 5.7 % (ref 4.6–6.5)

## 2021-03-11 LAB — CORTISOL: Cortisol, Plasma: 7.9 ug/dL

## 2021-03-11 LAB — T3, FREE: T3, Free: 3.3 pg/mL (ref 2.3–4.2)

## 2021-03-11 MED ORDER — METFORMIN HCL ER 500 MG PO TB24: 500.0000 mg | ORAL_TABLET | Freq: Every day | ORAL | 3 refills | Status: DC

## 2021-03-11 NOTE — Progress Notes (Signed)
Subjective:    Patient ID: Meredith Finley, female    DOB: 1961/03/02, 60 y.o.   MRN: [id number]  HPI Pt is ref by Dr Regis Bill.  Pt reports (1 year of worsening of) 5 years of intermitt palpitations, constipation, difficulty with concentration, weight gain, insomnia, dry skin, and hair loss.  She takes metformin for hyperglycemia.  Pt says E2 helps menopausal sxs (TAH/BSO at age 56).    Past Medical History:  Diagnosis Date  . A-fib ([place])   . Allergy   . Anxiety   . Asthma   . Cataract   . Diabetes mellitus without complication ([place])    Gestational Diabetes  . Endometriosis 1996  . GERD (gastroesophageal reflux disease)   . H/O osteopenia   . HLD (hyperlipidemia)   . Hx of varicella   . NEPHROLITHIASIS 02/22/2009   Qualifier: Diagnosis of  By: Burt Knack CMA, Cecille Rubin    . Osteopenia   . Other specified congenital anomaly of kidney   . Palpitations   . Prediabetes   . Sleep apnea   . Transfusion history    placental abruption 1986  . Tremor     Past Surgical History:  Procedure Laterality Date  . ABDOMINAL HYSTERECTOMY    . ABDOMINAL SURGERY     for congenital kidney abnormality  . CARPAL TUNNEL RELEASE    . CESAREAN SECTION  [id number]  . LAPAROSCOPIC SALPINGO OOPHERECTOMY    . LITHOTRIPSY  1992-1996   multiple  . NOSE SURGERY    . surgical fusion disk 5& 6      Social History   Socioeconomic History  . Marital status: Married    Spouse name: Not on file  . Number of children: 2  . Years of education: Not on file  . Highest education level: Not on file  Occupational History  . Occupation: Psychiatrist: [employer]  Tobacco Use  . Smoking status: Never Smoker  . Smokeless tobacco: Never Used  Vaping Use  . Vaping Use: Never used  Substance and Sexual Activity  . Alcohol use: No  . Drug use: No  . Sexual activity: Yes    Birth control/protection: Surgical, Post-menopausal  Other Topics Concern  . Not on file  Social History Narrative    Usually gets 9 hours of sleep per night   4 people living in the home.   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 cetirizine (ZYRTEC) 10 MG tablet Take 10 mg by mouth daily.    . Cholecalciferol (VITAMIN D3) 2000 units capsule Take 2,000 Units by mouth daily.    . Cyanocobalamin (VITAMIN B-12 PO) Take 1,500 mcg by mouth daily.    Marland Kitchen estradiol (ESTRACE) 0.5 MG tablet Take by mouth.    . Magnesium 250 MG TABS Take by mouth.    . Melatonin-Pyridoxine (MELATIN PO) Take 5 mg by mouth as needed.    . Multiple Vitamin (MULTIVITAMIN) tablet Take 1  tablet by mouth daily.    . indomethacin (INDOCIN SR) 75 MG CR capsule Take 1 capsule (75 mg total) by mouth daily. (Patient taking differently: Take 75 mg by mouth daily as needed.) 30 capsule 0   Current Facility-Administered Medications on File Prior to Visit  Medication Dose Route Frequency Provider Last Rate Last Admin  . 0.9 %  sodium chloride infusion  500 mL Intravenous Once Doran Stabler, MD        Allergies  Allergen Reactions  . Other Other (See Comments) and Rash    Fatigue, Rash   . Bystolic [Nebivolol Hcl]     Rash   . Ioxaglate   . Ivp Dye [Iodinated Diagnostic Agents] Hives  . Metoprolol Rash and Other (See Comments)    Fatigue, soreactic rash  . Niacin Nausea And Vomiting and Rash    Family History  Problem Relation Age of Onset  . Cancer Mother        Colon cancer, breast cancer and lung cancer  . Tremor Mother   . Colon cancer Mother 61       died at 75  . Lung cancer Mother        never  smoked or around cig smoke  . Arthritis Father   . Vision loss Father        Macular degeneration  . Scoliosis Father   . Lymphoma Brother 36  . Obesity Brother   . Hashimoto's thyroiditis Daughter   . Osteoporosis Maternal Grandmother   . Esophageal cancer Neg Hx   . Rectal cancer Neg Hx   . Stomach cancer Neg Hx     BP 110/60 (BP Location: Right Arm, Patient Position: Sitting, Cuff Size: Large)   Pulse 85   Ht 5\' 3"  (1.6 m)   Wt 186 lb 9.6 oz (84.6 kg)   SpO2 95%   BMI 33.05 kg/m    Review of Systems denies depression, numbness, easy bruising, and cold intolerance.      Objective:   Physical Exam VS: see vs page GEN: no distress HEAD: head: no deformity eyes: no periorbital swelling, no proptosis external nose and ears are normal NECK: supple, thyroid is not enlarged CHEST WALL: no deformity LUNGS: clear to auscultation CV: reg rate and rhythm, no murmur.  MUSCULOSKELETAL: gait is normal and steady EXTEMITIES: no deformity.  no leg edema NEURO:  readily moves all 4's.  sensation is intact to touch on all 4's SKIN:  Normal texture and temperature.  No rash or suspicious lesion is visible.   NODES:  None palpable at the neck.   PSYCH: alert, well-oriented.  Does not appear anxious nor depressed.       [place] (2021): Fairly normal-appearing thyroid gland. Benign bilateral nodules measuring 0.5-0.6 cm. Benign-appearing bilateral cervical lymphadenopathy with largest left-sided Zone II lymph node measuring 2.3 cm in length.    I have reviewed outside records, and summarized:   Pt was noted to have hair loss, and referred here.  She saw ENT, who dx'ed occluded salivary duct.    Lab Results  Component Value Date   TSH 1.22 01/14/2021   Lab Results  Component Value Date   HGBA1C 5.7 01/14/2021   Lab Results  Component Value Date   CREATININE 0.67 01/14/2021   BUN 10 01/14/2021   NA 139 01/14/2021   K 4.0 01/14/2021   CL 103 01/14/2021   CO2 29 01/14/2021    Lab Results  Component Value Date   ALT 14  01/14/2021   AST 16 01/14/2021   ALKPHOS 107 01/14/2021   BILITOT 0.6 01/14/2021   Lab Results  Component Value Date   WBC 8.4 01/14/2021   HGB 14.3 01/14/2021   HCT 41.8 01/14/2021   MCV 92.4 01/14/2021   PLT 291.0 01/14/2021       Assessment & Plan:  Hyperglycemia: we discussed risk of progression to DM.  MNG: she is at risk for abnormal thyroid function.   Patient Instructions  because of the tiny nodules in the thyroid, you are at risk for abnormal thyroid function in the future.  Therefore, you should have your thyroid blood tests each year.   I have sent a prescription to your pharmacy, for the metformin.   Blood tests are requested for you today.  We'll let you know about the results.   I would be happy to see you back here as needed.

## 2021-03-11 NOTE — Patient Instructions (Addendum)
because of the tiny nodules in the thyroid, you are at risk for abnormal thyroid function in the future.  Therefore, you should have your thyroid blood tests each year.   I have sent a prescription to your pharmacy, for the metformin.   Blood tests are requested for you today.  We'll let you know about the results.   I would be happy to see you back here as needed.

## 2021-03-12 LAB — THYROID PEROXIDASE ANTIBODY: Thyroperoxidase Ab SerPl-aCnc: <1 IU/mL (ref <9)

## 2021-08-05 ENCOUNTER — Other Ambulatory Visit: Payer: Self-pay

## 2021-08-05 NOTE — Progress Notes (Signed)
Chief Complaint  Patient presents with   Referral        Labs    HPI: Meredith Finley 60 y.o. come in for Chronic disease management number of medicine issues  Had run out of metformin  5 months ago .   Was on preventive.  Management as has Hx of gestational diabetes.  Dietary   intervention to see what her A1c is that way off the metformin.  Has not refilled medicine to restart from endocrinology  Ran out of estrogen also  ; woud  like restest  off med    no recent .   Med or gyne eval.  Has had osteopenia   but now on vit d .  Has history of surgical premature menopause for large cyst she calls Pcos and cysts  had to  have surgical  menopause Had very elevated triglycerides and did a few weeks of significant diet changes and it came down.  She is back to regular eating.  The last triglycerides in the record were nonfasting  Hard time losing weight. Some throat clearing issues has allergies wonder if she should be retested some foods hard to swallow occasionally feels like getting stuck but not with pills she points to the upper chest neck area. Recently had a evaluation for thyroid. ROS: See pertinent positives and negatives per HPI.  Past Medical History:  Diagnosis Date   A-fib [facility])    Allergy    Anxiety    Asthma    Cataract    Diabetes mellitus without complication ([place])    Gestational Diabetes   Endometriosis 1996   GERD (gastroesophageal reflux disease)    H/O osteopenia    HLD (hyperlipidemia)    Hx of varicella    NEPHROLITHIASIS 02/22/2009   Qualifier: Diagnosis of  By: Burt Knack CMA, Gerarda Fraction    Other specified congenital anomaly of kidney    Palpitations    Prediabetes    Sleep apnea    Transfusion history    placental abruption 1986   Tremor     Family History  Problem Relation Age of Onset   Cancer Mother        Colon cancer, breast cancer and lung cancer   Tremor Mother    Colon cancer Mother 85       died at 48   Lung cancer Mother         never smoked or around cig smoke   Arthritis Father    Vision loss Father        Macular degeneration   Scoliosis Father    Lymphoma Brother 54   Obesity Brother    Hashimoto's thyroiditis Daughter    Osteoporosis Maternal Grandmother    Esophageal cancer Neg Hx    Rectal cancer Neg Hx    Stomach cancer Neg Hx     Social History   Socioeconomic History   Marital status: Married    Spouse name: Not on file   Number of children: 2   Years of education: Not on file   Highest education level: Not on file  Occupational History   Occupation: Psychiatrist: [employer]  Tobacco Use   Smoking status: Never   Smokeless tobacco: Never  Vaping Use   Vaping Use: Never used  Substance and Sexual Activity   Alcohol use: No   Drug use: No   Sexual activity: Yes    Birth control/protection: Surgical, Post-menopausal  Other Topics Concern   Not on file  Social History Narrative   Usually gets 9 hours of sleep per night   4 people living in the home.   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     Cholecalciferol (VITAMIN D3) 2000 units capsule Take 2,000 Units by mouth daily.     Cyanocobalamin (VITAMIN B-12 PO) Take 1,500 mcg by mouth daily.     estradiol (ESTRACE) 0.5 MG tablet Take by mouth.     Magnesium 250 MG TABS Take by mouth.     Melatonin-Pyridoxine (MELATIN PO) Take 5 mg by mouth as needed.     metFORMIN (GLUCOPHAGE-XR) 500 MG 24 hr tablet Take 1 tablet (500 mg total) by mouth daily. 90 tablet 3   Multiple Vitamin  (MULTIVITAMIN) tablet Take 1 tablet by mouth daily.     indomethacin (INDOCIN SR) 75 MG CR capsule Take 1 capsule (75 mg total) by mouth daily. (Patient taking differently: Take 75 mg by mouth daily as needed.) 30 capsule 0   Facility-Administered Medications Prior to Visit  Medication Dose Route Frequency Provider Last Rate Last Admin   0.9 %  sodium chloride infusion  500 mL Intravenous Once Danis, Estill Cotta III, MD         EXAM:  BP 124/70 (BP Location: Left Arm, Patient Position: Sitting, Cuff Size: Normal)   Pulse 80   Temp 98.8 F (37.1 C) (Oral)   Ht 5\' 3"  (1.6 m)   Wt 188 lb 12.8 oz (85.6 kg)   SpO2 96%   BMI 33.44 kg/m   Body mass index is 33.44 kg/m.  GENERAL: vitals reviewed and listed above, alert, oriented, appears well hydrated and in no acute distress looks well has central obesity no striae HEENT: atraumatic, conjunctiva  clear, no obvious abnormalities on inspection of external nose and ears OP : Masked NECK: no obvious masses on inspection palpation thyroid palpable LUNGS: clear to auscultation bilaterally, no wheezes, rales or rhonchi, good air movement CV: HRRR, no clubbing cyanosis or  peripheral edema nl cap refill  Abdomen soft without again a megaly guarding rebound and no fluid wav MS: moves all extremities without noticeable focal  abnormality PSYCH: pleasant and cooperative, no obvious depression or anxiety Lab Results  Component Value Date   WBC 8.4 01/14/2021   HGB 14.3 01/14/2021   HCT 41.8 01/14/2021   PLT 291.0 01/14/2021   GLUCOSE 104 (H) 01/14/2021   CHOL 204 (H) 01/14/2021   TRIG 528.0 (H) 01/14/2021   HDL 39.30 01/14/2021   LDLDIRECT 110.0 01/14/2021   LDLCALC 88 09/13/2018   ALT 14 01/14/2021   AST 16 01/14/2021   NA 139 01/14/2021   K 4.0 01/14/2021   CL 103 01/14/2021   CREATININE 0.67 01/14/2021   BUN 10 01/14/2021   CO2 29 01/14/2021   TSH 1.22 01/14/2021   HGBA1C 5.8 (A) 08/06/2021   BP Readings from Last 3 Encounters:  08/06/21 124/70  03/11/21 110/60  01/22/21 118/76    ASSESSMENT AND PLAN:  Discussed the following assessment and plan:  High triglycerides  History of gestational diabetes - Plan: POCT glycosylated hemoglobin (Hb A1C)  Surgical menopause on hormone replacement therapy - Plan: Estradiol, TSH, T4, free, T3, free, T3, free, T4, free, TSH, Estradiol  Hyperglycemia - Plan: Estradiol, TSH, T4, free, T3, free, T3, free, T4, free, TSH, Estradiol  Surgical menopause - Plan: Estradiol, TSH, T4, free, T3, free, T3, free, T4, free, TSH, Estradiol  Family history of thyroid disease - Plan: Estradiol, TSH, T4, free, T3, free, T3, free, T4, free, TSH, Estradiol  Hair loss - Plan: Estradiol, TSH, T4, free, T3, free, T3, free, T4, free, TSH, Estradiol  Central obesity Get back on metformin  as planned   Intensify lifestyle interventions. And plan fasting liupid panel in 2-3 mos  ol to get at [facility] lab. Let us know  if need allergy  referral could have  sx causing upper airway esophageal sx. Consdier eie also  Central obestiy dysmetabolic   consider other interventions if helpful Requesting estrogen level will do today even though she is off the estradiol not sure would be a helpful level however when not on the medication to titrate.  Discussed risk benefits. She can follow-up with GYN about this. We will repeat her thyroid tests at her request because of hair shedding and eyebrows getting thinner although last done a while back.  Negative TPO. Her triglycerides were quite elevated postprandial last checked while in the pulm and intensive lifestyle or as possible and get a follow-up lipid panel when convenient in 2 to 3 months at the [place] office lab. -Patient advised to return or notify health care team  if  new concerns arise.  Patient Instructions  Plan  lab today . Advise get back on metformin   Estradiol as  possible .   Plan   lifestyle intervention healthy eating and exercise .      And then fasting lipid panel  in 2-3 months . Consider ozempic type meds   for weight  control and hyperglycemia     Mariann Laster K. Sonnie Pawloski M.D.

## 2021-08-06 ENCOUNTER — Ambulatory Visit (INDEPENDENT_AMBULATORY_CARE_PROVIDER_SITE_OTHER): Payer: 59 | Admitting: Internal Medicine

## 2021-08-06 ENCOUNTER — Encounter: Payer: Self-pay | Admitting: Internal Medicine

## 2021-08-06 VITALS — BP 124/70 | HR 80 | Temp 98.8°F | Ht 63.0 in | Wt 188.8 lb

## 2021-08-06 DIAGNOSIS — E8940 Asymptomatic postprocedural ovarian failure: Secondary | ICD-10-CM

## 2021-08-06 DIAGNOSIS — Z8632 Personal history of gestational diabetes: Secondary | ICD-10-CM | POA: Diagnosis not present

## 2021-08-06 DIAGNOSIS — R739 Hyperglycemia, unspecified: Secondary | ICD-10-CM

## 2021-08-06 DIAGNOSIS — Z79890 Hormone replacement therapy (postmenopausal): Secondary | ICD-10-CM

## 2021-08-06 DIAGNOSIS — L659 Nonscarring hair loss, unspecified: Secondary | ICD-10-CM | POA: Diagnosis not present

## 2021-08-06 DIAGNOSIS — E781 Pure hyperglyceridemia: Secondary | ICD-10-CM

## 2021-08-06 DIAGNOSIS — Z8349 Family history of other endocrine, nutritional and metabolic diseases: Secondary | ICD-10-CM | POA: Diagnosis not present

## 2021-08-06 DIAGNOSIS — E65 Localized adiposity: Secondary | ICD-10-CM

## 2021-08-06 LAB — POCT GLYCOSYLATED HEMOGLOBIN (HGB A1C): Hemoglobin A1C: 5.8 % — AB (ref 4.0–5.6)

## 2021-08-06 NOTE — Patient Instructions (Signed)
Plan  lab today . Advise get back on metformin   Estradiol as  possible .   Plan   lifestyle intervention healthy eating and exercise .     And then fasting lipid panel  in 2-3 months . Consider ozempic type meds   for weight  control and hyperglycemia

## 2021-08-07 ENCOUNTER — Encounter: Payer: Self-pay | Admitting: Endocrinology

## 2021-08-07 LAB — ESTRADIOL: Estradiol: 21 pg/mL

## 2021-08-07 LAB — T3, FREE: T3, Free: 3 pg/mL (ref 2.3–4.2)

## 2021-08-07 LAB — TSH: TSH: 0.49 u[IU]/mL (ref 0.35–5.50)

## 2021-08-07 LAB — T4, FREE: Free T4: 0.84 ng/dL (ref 0.60–1.60)

## 2021-08-07 NOTE — Progress Notes (Signed)
Thyroid in range , estradiol is post menopausal range as we expected

## 2021-08-14 ENCOUNTER — Ambulatory Visit (INDEPENDENT_AMBULATORY_CARE_PROVIDER_SITE_OTHER): Payer: 59 | Admitting: Endocrinology

## 2021-08-14 ENCOUNTER — Other Ambulatory Visit: Payer: Self-pay

## 2021-08-14 VITALS — BP 140/70 | HR 87 | Ht 63.0 in | Wt 190.4 lb

## 2021-08-14 DIAGNOSIS — R002 Palpitations: Secondary | ICD-10-CM | POA: Diagnosis not present

## 2021-08-14 DIAGNOSIS — R739 Hyperglycemia, unspecified: Secondary | ICD-10-CM | POA: Diagnosis not present

## 2021-08-14 DIAGNOSIS — I480 Paroxysmal atrial fibrillation: Secondary | ICD-10-CM | POA: Diagnosis not present

## 2021-08-14 DIAGNOSIS — I959 Hypotension, unspecified: Secondary | ICD-10-CM

## 2021-08-14 LAB — CORTISOL: Cortisol, Plasma: 6.2 ug/dL

## 2021-08-14 LAB — POCT GLYCOSYLATED HEMOGLOBIN (HGB A1C): Hemoglobin A1C: 5.7 % — AB (ref 4.0–5.6)

## 2021-08-14 NOTE — Progress Notes (Signed)
Subjective:    Patient ID: Meredith Finley, female    DOB: 1960-11-11, 60 y.o.   MRN: [id number]  HPI Pt returns for f/u of palpitations.  Pt says E2 helps menopausal sxs (TAH/BSO at age 42). She takes metformin for hyperglycemia.     She reports fatigue, anxiety, constipation, headache, and insomnia.  She has been off E2 x 6 mos.  Since then, sxs are slightly worse.   Past Medical History:  Diagnosis Date   A-fib [facility])    Allergy    Anxiety    Asthma    Cataract    Diabetes mellitus without complication ([place])    Gestational Diabetes   Endometriosis 1996   GERD (gastroesophageal reflux disease)    H/O osteopenia    HLD (hyperlipidemia)    Hx of varicella    NEPHROLITHIASIS 02/22/2009   Qualifier: Diagnosis of  By: Burt Knack CMA, Gerarda Fraction    Other specified congenital anomaly of kidney    Palpitations    Prediabetes    Sleep apnea    Transfusion history    placental abruption 1986   Tremor     Past Surgical History:  Procedure Laterality Date   ABDOMINAL HYSTERECTOMY     ABDOMINAL SURGERY     for congenital kidney abnormality   CARPAL TUNNEL RELEASE     CESAREAN SECTION  [phone]   LAPAROSCOPIC SALPINGO OOPHERECTOMY     LITHOTRIPSY  1992-1996   multiple   NOSE SURGERY     surgical fusion disk 5& 6      Social History   Socioeconomic History   Marital status: Married    Spouse name: Not on file   Number of children: 2   Years of education: Not on file   Highest education level: Not on file  Occupational History   Occupation: Psychiatrist: [employer]  Tobacco Use   Smoking status: Never   Smokeless tobacco: Never  Vaping Use   Vaping Use: Never used  Substance and Sexual Activity   Alcohol use: No   Drug use: No   Sexual activity: Yes    Birth control/protection: Surgical, Post-menopausal  Other Topics Concern   Not on file  Social History Narrative   Usually gets 9 hours of sleep per night   4 people living in the home.    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     Cholecalciferol (VITAMIN D3) 2000 units capsule Take 2,000 Units by mouth daily.     Cyanocobalamin (VITAMIN B-12 PO) Take 1,500 mcg by mouth daily.     estradiol (ESTRACE) 0.5 MG tablet Take by mouth.     Magnesium 250 MG TABS Take by mouth.     Melatonin-Pyridoxine (MELATIN PO) Take 5 mg by mouth as needed.     metFORMIN (GLUCOPHAGE-XR) 500 MG 24 hr tablet  Take 1 tablet (500 mg total) by mouth daily. 90 tablet 3   Multiple Vitamin (MULTIVITAMIN) tablet Take 1 tablet by mouth daily.     Current Facility-Administered Medications on File Prior to Visit  Medication Dose Route Frequency Provider Last Rate Last Admin   0.9 %  sodium chloride infusion  500 mL Intravenous Once Danis, Kirke Corin, MD        Allergies  Allergen Reactions   Other Other (See Comments) and Rash    Fatigue, Rash    Bystolic [Nebivolol Hcl]     Rash    Ioxaglate    Ivp Dye [Iodinated Diagnostic Agents] Hives   Metoprolol Rash and Other (See Comments)    Fatigue, soreactic rash   Niacin Nausea And Vomiting and Rash    Family History  Problem Relation Age of Onset   Cancer Mother        Colon cancer, breast cancer and lung cancer   Tremor Mother    Colon cancer Mother 5       died at 94   Lung cancer Mother        never smoked or around cig smoke   Arthritis Father    Vision loss Father        Macular degeneration   Scoliosis Father    Lymphoma Brother 77   Obesity  Brother    Hashimoto's thyroiditis Daughter    Osteoporosis Maternal Grandmother    Esophageal cancer Neg Hx    Rectal cancer Neg Hx    Stomach cancer Neg Hx     BP 140/70 (BP Location: Right Arm, Patient Position: Sitting, Cuff Size: Normal)   Pulse 87   Ht 5\' 3"  (1.6 m)   Wt 190 lb 6.4 oz (86.4 kg)   SpO2 97%   BMI 33.73 kg/m    Review of Systems Denies excessive diaphoresis.      Objective:   Physical Exam VITAL SIGNS:  See vs page.   GENERAL: no distress.   NECK: thyroid is slightly enlarged, with irreg surface, but no palpable nodule.    Free T4=0.84  Lab Results  Component Value Date   TSH 0.49 08/06/2021    Lab Results  Component Value Date   HGBA1C 5.7 (A) 08/14/2021      Assessment & Plan:  Palpitations: check catechols Hyperglycemia: stable.  Please continue the same metformin Fatigue: check cortisol.

## 2021-08-14 NOTE — Patient Instructions (Addendum)
Blood tests are requested for you today.  We'll let you know about the results.   I would be happy to see you back here as needed.

## 2021-08-15 LAB — FOLLICLE STIMULATING HORMONE: FSH: 18.9 m[IU]/mL

## 2021-08-15 LAB — LUTEINIZING HORMONE: LH: 23.19 m[IU]/mL

## 2021-08-20 LAB — CATECHOLAMINES, FRACTIONATED, PLASMA
Dopamine: <10 pg/mL
Epinephrine: <20 pg/mL
Norepinephrine: 606 pg/mL
Total Catecholamines: 606 pg/mL

## 2021-08-20 LAB — METANEPHRINES, PLASMA
Metanephrine, Free: <25 pg/mL (ref <=57)
Normetanephrine, Free: 37 pg/mL (ref <=148)
Total Metanephrines-Plasma: 37 pg/mL (ref <=205)

## 2021-08-20 LAB — ACTH: C206 ACTH: 19 pg/mL (ref 6–50)

## 2021-08-30 ENCOUNTER — Other Ambulatory Visit (INDEPENDENT_AMBULATORY_CARE_PROVIDER_SITE_OTHER): Payer: 59

## 2021-08-30 DIAGNOSIS — E781 Pure hyperglyceridemia: Secondary | ICD-10-CM | POA: Diagnosis not present

## 2021-08-30 LAB — LDL CHOLESTEROL, DIRECT: Direct LDL: 100 mg/dL

## 2021-08-30 LAB — LIPID PANEL
Cholesterol: 164 mg/dL (ref 0–200)
HDL: 40.7 mg/dL (ref >39.00)
NonHDL: 123.27
Total CHOL/HDL Ratio: 4
Triglycerides: 205 mg/dL — ABNORMAL HIGH (ref 0.0–149.0)
VLDL: 41 mg/dL — ABNORMAL HIGH (ref 0.0–40.0)

## 2021-09-01 NOTE — Progress Notes (Signed)
Lipid panel is improved  Tg 200 down from 500 . Continue lifestyle intervention healthy eating and exercise .

## 2021-09-06 NOTE — Progress Notes (Signed)
09/09/21- 60 yoF never smoker for sleep evaluation coming to re-establish for OSA Medical problem list includes PAFib, OSA, Asthma, GERD, Multinodular Goiter, DM2, Osteopenia, Nephrolithiasis,  NPSG 07/08/13- Mild OSA AHI  7.1/hr, Desat to 89%, Epworth 14/24, weight 185 Lbs.  Had CPAP auto 5-15/ [facility] at [place] in 2014.(For snoring and fatigue) Epworth score- Body weight today-190 lbs Covid vax-2 Phizer, 1 Moderna Flu vax- She comes with her husband today. Has CPAP 7 and reports machine about 58 years old. No card. Reports she sleeps well with it and no problems. We will need to get download capability installed. She would like our help with asthma. Wheezes frequently. Thinks worse since daughter moved in with 4 cats. Has pending appointment with Dr Fredderick Phenix for Allergy assessment. No inhalers. Has PAfib, avoids caffeine. Episodes about 1x/ week. We discussed potential for albuterol to stimulate. She reports both Channel blockers and Beta blockrss cause rash.  ENT surgery - tonsils.Septoplasty.  Prior to Admission medications   Medication Sig Start Date End Date Taking? Authorizing Provider  cetirizine (ZYRTEC) 10 MG tablet Take 10 mg by mouth daily.   Yes [provider]  Cholecalciferol (VITAMIN D3) 2000 units capsule Take 2,000 Units by mouth daily.   Yes [provider]  Cyanocobalamin (VITAMIN B-12 PO) Take 1,500 mcg by mouth daily.   Yes [provider]  levalbuterol (XOPENEX HFA) 45 MCG/ACT inhaler Inhale 2 puffs into the lungs every 6 (six) hours as needed for wheezing. 09/09/21  Yes Baird Lyons D, MD  Magnesium 250 MG TABS Take by mouth.   Yes [provider]  Melatonin-Pyridoxine (MELATIN PO) Take 5 mg by mouth as needed.   Yes [provider]  metFORMIN (GLUCOPHAGE-XR) 500 MG 24 hr tablet Take 1 tablet (500 mg total) by mouth daily. 03/11/21  Yes Renato Shin, MD  Multiple Vitamin (MULTIVITAMIN) tablet Take 1 tablet by mouth daily.   Yes  [provider]  estradiol (ESTRACE) 0.5 MG tablet Take by mouth. Patient not taking: Reported on 09/09/2021    [provider]   Past Medical History:  Diagnosis Date   A-fib [facility])    Allergy    Anxiety    Asthma    Cataract    Diabetes mellitus without complication ([place])    Gestational Diabetes   Endometriosis 1996   GERD (gastroesophageal reflux disease)    H/O osteopenia    HLD (hyperlipidemia)    Hx of varicella    NEPHROLITHIASIS 02/22/2009   Qualifier: Diagnosis of  By: Burt Knack CMA, Gerarda Fraction    Other specified congenital anomaly of kidney    Palpitations    Prediabetes    Sleep apnea    Transfusion history    placental abruption 1986   Tremor    Past Surgical History:  Procedure Laterality Date   ABDOMINAL HYSTERECTOMY     ABDOMINAL SURGERY     for congenital kidney abnormality   CARPAL TUNNEL RELEASE     CESAREAN SECTION  [phone]   LAPAROSCOPIC SALPINGO OOPHERECTOMY     LITHOTRIPSY  1992-1996   multiple   NOSE SURGERY     surgical fusion disk 5& 6     Family History  Problem Relation Age of Onset   Cancer Mother        Colon cancer, breast cancer and lung cancer   Tremor Mother    Colon cancer Mother 51       died at 58   Lung cancer Mother  never smoked or around cig smoke   Arthritis Father    Vision loss Father        Macular degeneration   Scoliosis Father    Lymphoma Brother 44   Obesity Brother    Hashimoto's thyroiditis Daughter    Osteoporosis Maternal Grandmother    Esophageal cancer Neg Hx    Rectal cancer Neg Hx    Stomach cancer Neg Hx    Social History   Socioeconomic History   Marital status: Married    Spouse name: Not on file   Number of children: 2   Years of education: Not on file   Highest education level: Not on file  Occupational History   Occupation: Psychiatrist: [employer]  Tobacco Use   Smoking status: Never   Smokeless tobacco: Never  Vaping Use    Vaping Use: Never used  Substance and Sexual Activity   Alcohol use: No   Drug use: No   Sexual activity: Yes    Birth control/protection: Surgical, Post-menopausal  Other Topics Concern   Not on file  Social History Narrative   Usually gets 9 hours of sleep per night   4 people living in the home.   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 HEENT:    headaches, difficulty swallowing, tooth/dental problems, sore throat,       sneezing, itching, ear ache, nasal congestion, post nasal drip, snoring CV:    chest pain, orthopnea, PND, swelling in lower extremities, anasarca,         dizziness, +palpitations Resp:   shortness of breath with exertion or at rest.                productive cough,   non-productive cough, coughing up of blood.              change in color of mucus.  wheezing.   Skin:    rash or lesions. GI:  No-   heartburn, indigestion, abdominal pain, nausea, vomiting, diarrhea,                 change in bowel habits, loss of appetite GU: dysuria, change in color of urine, no urgency or frequency.   flank pain. MS:   joint pain, stiffness, decreased range of motion, back pain. Neuro-     nothing unusual Psych:  change in mood or affect.  depression or anxiety.   memory loss.   OBJ- Physical Exam General- Alert, Oriented, Affect-appropriate, Distress- none acute, + obese Skin- r+rosacea Lymphadenopathy- none Head- atraumatic            Eyes- Gross vision intact, PERRLA, conjunctivae and secretions clear            Ears- Hearing,  canals-normal            Nose- Clear, no-Septal dev, mucus, polyps, erosion, perforation  Throat- Mallampati IV , mucosa clear , drainage- none, tonsils-absent, + teeth Neck- + anterior surgical scar, trachea midline, no stridor , thyroid nl, carotid no bruit Chest - symmetrical excursion , unlabored           Heart/CV- RRR , no murmur , no gallop  , no rub, nl s1 s2                           - JVD- none , edema- none, stasis changes- none, varices- none           Lung- clear to P&A, wheeze- none, cough- none , dullness-none, rub- none           Chest wall-  Abd-  Br/ Gen/ Rectal- Not done, not indicated Extrem- cyanosis- none, clubbing, none, atrophy- none, strength- nl Neuro- grossly intact to observation

## 2021-09-09 ENCOUNTER — Encounter: Payer: Self-pay | Admitting: Internal Medicine

## 2021-09-09 ENCOUNTER — Other Ambulatory Visit: Payer: Self-pay

## 2021-09-09 ENCOUNTER — Ambulatory Visit (INDEPENDENT_AMBULATORY_CARE_PROVIDER_SITE_OTHER): Payer: 59 | Admitting: Internal Medicine

## 2021-09-09 DIAGNOSIS — G4733 Obstructive sleep apnea (adult) (pediatric): Secondary | ICD-10-CM | POA: Diagnosis not present

## 2021-09-09 DIAGNOSIS — J4530 Mild persistent asthma, uncomplicated: Secondary | ICD-10-CM | POA: Diagnosis not present

## 2021-09-09 MED ORDER — LEVALBUTEROL TARTRATE 45 MCG/ACT IN AERO: 2.0000 | INHALATION_SPRAY | Freq: Four times a day (QID) | RESPIRATORY_TRACT | 12 refills | Status: DC | PRN

## 2021-09-09 NOTE — Assessment & Plan Note (Signed)
She describes good compliance and control. Comfortable with reported setting 7. Plan- continue current setting.Ask Huey Romans to install AirView/ card

## 2021-09-09 NOTE — Patient Instructions (Signed)
Script sent for Xopenex rescue inhaler   inhale 2 puffs every 6 hours if needed for wheezing shortness of breath  Order- flu vax- standard  Order- DME Huey Romans - Please add AirView.  Continue CPAP 7, mask of choice, humidifier, supplies, AirView/ card  Please call if we can help

## 2021-09-09 NOTE — Assessment & Plan Note (Signed)
Needs at least a rescue inhaler. Bcause of paroxysmal AFib we will try levalbuterol for tolerance. May need a steroid maintenance inhaler.

## 2021-09-10 ENCOUNTER — Other Ambulatory Visit: Payer: Self-pay | Admitting: Obstetrics and Gynecology

## 2021-09-10 DIAGNOSIS — R928 Other abnormal and inconclusive findings on diagnostic imaging of breast: Secondary | ICD-10-CM

## 2021-09-11 NOTE — Telephone Encounter (Signed)
Will forward to CY to make him aware.

## 2021-09-26 ENCOUNTER — Other Ambulatory Visit: Payer: Self-pay

## 2021-09-26 ENCOUNTER — Ambulatory Visit
Admission: RE | Admit: 2021-09-26 | Discharge: 2021-09-26 | Disposition: A | Discharge status: Home / Self Care | Payer: 59 | Source: Ambulatory Visit | Attending: Obstetrics and Gynecology | Admitting: Obstetrics and Gynecology

## 2021-09-26 ENCOUNTER — Ambulatory Visit: Admission: RE | Admit: 2021-09-26 | Payer: 59 | Source: Ambulatory Visit

## 2021-09-26 DIAGNOSIS — R928 Other abnormal and inconclusive findings on diagnostic imaging of breast: Secondary | ICD-10-CM

## 2022-01-08 NOTE — Progress Notes (Deleted)
09/09/21- 83 yoF never smoker for sleep evaluation coming to re-establish for OSA ?Medical problem list includes PAFib, OSA, Asthma, GERD, Multinodular Goiter, DM2, Osteopenia, Nephrolithiasis,  ?NPSG 07/08/13- Mild OSA AHI  7.1/hr, Desat to 89%, Epworth 14/24, weight 185 Lbs. ? Had CPAP auto 5-15/ [facility] at [place] in 2014.(For snoring and fatigue) ?Epworth score- ?Body weight today-190 lbs ?Covid vax-2 Phizer, 1 Moderna ?Flu vax- ?She comes with her husband today. Has CPAP 7 and reports machine about 59 years old. No card. Reports she sleeps well with it and no problems. We will need to get download capability installed. ?She would like our help with asthma. Wheezes frequently. Thinks worse since daughter moved in with 4 cats. Has pending appointment with Dr Fredderick Phenix for Allergy assessment. No inhalers. ?Has PAfib, avoids caffeine. Episodes about 1x/ week. We discussed potential for albuterol to stimulate. She reports both Channel blockers and Beta blockers cause rash.  ?ENT surgery - tonsils.Septoplasty. ? ?01/09/22- 61 yoF never smoker followed for OSA, Asthma, Allergy (Dr Harold Hedge) complicated by PAFib, GERD, Multinodular Goiter, DM2, Osteopenia, Nephrolithiasis, ?-Xopenex hfa, zyrtec,  ?CPAP 7/ ?Download- ?Body weight today- ?Covid vax- ?Flu vax- ? ? ? ?ROS-see HPI   + = positive ?Constitutional:    weight loss, night sweats, fevers, chills, fatigue, lassitude. ?HEENT:    headaches, difficulty swallowing, tooth/dental problems, sore throat,  ?     sneezing, itching, ear ache, nasal congestion, post nasal drip, snoring ?CV:    chest pain, orthopnea, PND, swelling in lower extremities, anasarca,         dizziness, +palpitations ?Resp:   shortness of breath with exertion or at rest.   ?             productive cough,   non-productive cough, coughing up of blood.   ?           change in color of mucus.  wheezing.   ?Skin:    rash or lesions. ?GI:  No-   heartburn, indigestion, abdominal pain, nausea, vomiting,  diarrhea,  ?               change in bowel habits, loss of appetite ?GU: dysuria, change in color of urine, no urgency or frequency.   flank pain. ?MS:   joint pain, stiffness, decreased range of motion, back pain. ?Neuro-     nothing unusual ?Psych:  change in mood or affect.  depression or anxiety.   memory loss. ? ? ?OBJ- Physical Exam ?General- Alert, Oriented, Affect-appropriate, Distress- none acute, + obese ?Skin- r+rosacea ?Lymphadenopathy- none ?Head- atraumatic ?           Eyes- Gross vision intact, PERRLA, conjunctivae and secretions clear ?           Ears- Hearing, canals-normal ?           Nose- Clear, no-Septal dev, mucus, polyps, erosion, perforation  ?           Throat- Mallampati IV , mucosa clear , drainage- none, tonsils-absent, + teeth ?Neck- + anterior surgical scar, trachea midline, no stridor , thyroid nl, carotid no bruit ?Chest - symmetrical excursion , unlabored ?          Heart/CV- RRR , no murmur , no gallop  , no rub, nl s1 s2 ?                          - JVD- none , edema- none, stasis changes- none, varices-  none ?          Lung- clear to P&A, wheeze- none, cough- none , dullness-none, rub- none ?          Chest wall-  ?Abd-  ?Br/ Gen/ Rectal- Not done, not indicated ?Extrem- cyanosis- none, clubbing, none, atrophy- none, strength- nl ?Neuro- grossly intact to observation ? ?

## 2022-01-09 ENCOUNTER — Ambulatory Visit: Payer: 59 | Admitting: Internal Medicine

## 2022-01-30 ENCOUNTER — Telehealth: Payer: Self-pay | Admitting: Cardiology

## 2022-01-30 NOTE — Telephone Encounter (Signed)
Patient would like to switch from Dr. Stanford Breed to Dr. Acie Fredrickson.  Dr. Acie Fredrickson is the provider her husband sees.  Are you both is agreement with this?   ?

## 2022-02-27 ENCOUNTER — Encounter: Payer: Self-pay | Admitting: Cardiovascular Disease

## 2022-02-27 NOTE — Progress Notes (Signed)
?Cardiology Office Note:   ? ?Date:  02/28/2022  ? ?ID:  Meredith Finley, DOB 05-03-1961, MRN [id number] ? ?PCP:  Burnis Medin, MD ?  ?[facility] Providers ?Cardiologist:  Mava Suares  ?  ? ?Referring MD: Burnis Medin, MD  ? ?Chief Complaint  ?Patient presents with  ? Atrial Fibrillation  ? ? ?History of Present Illness:  n  02/28/22 ?  ? ?Meredith Finley is a 61 y.o. female with a hx of atrial fib.  She has previously seen Dr. Stanford Breed. ?She has a hx of PAF ?Hx of HLD,  has been referred to the lipid clinic in the past ? ?Has chest pressure  and separate episodes of dysnea ? ?Dyspnea when she lies down on her back  ?Does some exercise  ?Walks slowly  ?Has severe dyspnea when she tries to walk quickly  ?Does not cause chest pressure  ? ?She had similar symptoms in 2019 when she saw Dr. Stanford Breed.  They originally tried to get a coronary CT angiogram but she has a reaction to the metoprolol ( psoriatic rash - severe rash , took 6 weeks to develop , 6 weeks to resolved ) so they were not able to complete the study. ? ?Has PAF only rarely now,  better if she keeps up with her hydration, limit caffeine, takes magnesium supplements,  proper rest .  Not on DOAC ?CHADS2VASC is  1 ( female)  ? ?Had a normal Lexiscan myoview study in 2019 . ? ?Has a history of hyperlipidemia.  In particular her triglycerides have been as high as 528. ? ?Hemoglobin A1c was 5.8 in September, 2022. ? ? ? ? ? ?Past Medical History:  ?Diagnosis Date  ? A-fib ([place])   ? Allergy   ? Anxiety   ? Asthma   ? Cataract   ? Diabetes mellitus without complication ([place])   ? Gestational Diabetes  ? Endometriosis 1996  ? GERD (gastroesophageal reflux disease)   ? H/O osteopenia   ? HLD (hyperlipidemia)   ? Hx of varicella   ? NEPHROLITHIASIS 02/22/2009  ? Qualifier: Diagnosis of  By: Burt Knack CMA, Cecille Rubin    ? Osteopenia   ? Other specified congenital anomaly of kidney   ? Palpitations   ? Prediabetes   ? Sleep apnea   ? Transfusion history   ? placental abruption 1986  ?  Tremor   ? ? ?Past Surgical History:  ?Procedure Laterality Date  ? ABDOMINAL HYSTERECTOMY    ? ABDOMINAL SURGERY    ? for congenital kidney abnormality  ? CARPAL TUNNEL RELEASE    ? CESAREAN SECTION  [phone]  ? LAPAROSCOPIC SALPINGO OOPHERECTOMY    ? LITHOTRIPSY  1992-1996  ? multiple  ? NOSE SURGERY    ? surgical fusion disk 5& 6    ? ? ?Current Medications: ?Current Meds  ?Medication Sig  ? Cholecalciferol (VITAMIN D3) 2000 units capsule Take 2,000 Units by mouth daily.  ? Cyanocobalamin (VITAMIN B-12 PO) Take 1,500 mcg by mouth daily.  ? estradiol (ESTRACE) 0.5 MG tablet Take by mouth.  ? fexofenadine (ALLEGRA) 180 MG tablet 1 tablet swallow whole with water; do not take with fruit juices.  ? fluticasone (FLONASE SENSIMIST) 27.5 MCG/SPRAY nasal spray as needed. 1-2 sprays (1 spray in each nostril)  ? levalbuterol (XOPENEX HFA) 45 MCG/ACT inhaler Inhale 2 puffs into the lungs every 6 (six) hours as needed for wheezing.  ? Magnesium 250 MG TABS Take by mouth.  ? Melatonin-Pyridoxine (MELATIN PO)  Take 5 mg by mouth as needed.  ? metFORMIN (GLUCOPHAGE-XR) 500 MG 24 hr tablet Take 1 tablet (500 mg total) by mouth daily.  ? Multiple Vitamin (MULTIVITAMIN) tablet Take 1 tablet by mouth daily.  ? ?Current Facility-Administered Medications for the 02/28/22 encounter (Office Visit) with Tamieka Rancourt, Wonda Cheng, MD  ?Medication  ? 0.9 %  sodium chloride infusion  ?  ? ?Allergies:   Other, Penicillin g, Bystolic [nebivolol hcl], Ioxaglate, Ivp dye [iodinated contrast media], Metoprolol, and Niacin  ? ?Social History  ? ?Socioeconomic History  ? Marital status: Married  ?  Spouse name: Not on file  ? Number of children: 2  ? Years of education: Not on file  ? Highest education level: Not on file  ?Occupational History  ? Occupation: Corporate treasurer  ?  Employer: [employer]  ?Tobacco Use  ? Smoking status: Never  ? Smokeless tobacco: Never  ?Vaping Use  ? Vaping Use: Never used  ?Substance and Sexual Activity  ? Alcohol use:  No  ? Drug use: No  ? Sexual activity: Yes  ?  Birth control/protection: Surgical, Post-menopausal  ?Other Topics Concern  ? Not on file  ?Social History Narrative  ? Usually gets 9 hours of sleep per night  ? 4 people living in the home.  ? g2p2  ? neg tad  ? Graphic designer  AA degree  ? 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 No pets  ?   ?   ?   ? ?Social Determinants of Health  ? ?Financial Resource Strain: Not on file  ?Food Insecurity: Not on file  ?Transportation Needs: Not on file  ?Physical Activity: Not on file  ?Stress: Not on file  ?Social Connections: Not on file  ?  ? ?Family History: ?The patient's family history includes Arthritis in her father; Cancer in her mother; Colon cancer (age of onset: 65) in her mother; Hashimoto's thyroiditis in her daughter; Lung cancer in her mother; Lymphoma (age of onset: 66) in her brother; Obesity in her brother; Osteoporosis in her maternal grandmother; Scoliosis in her father; Tremor in her mother; Vision loss in her father. There is no history of Esophageal cancer, Rectal cancer, or Stomach cancer. ? ?ROS:   ?Please see the history of present illness.    ? All other systems reviewed and are negative. ? ?EKGs/Labs/Other Studies Reviewed:   ? ?The following studies were reviewed today: ? ? ?EKG: February 28, 2022: Normal sinus rhythm at 71.  No ST or T wave changes. ? ?Recent Labs: ?08/06/2021: TSH 0.49  ?Recent Lipid Panel ?   ?Component Value Date/Time  ? CHOL 164 08/30/2021 1019  ? CHOL 181 09/13/2018 1006  ? TRIG 205.0 (H) 08/30/2021 1019  ? HDL 40.70 08/30/2021 1019  ? HDL 34 (L) 09/13/2018 1006  ? CHOLHDL 4 08/30/2021 1019  ? VLDL 41.0 (H) 08/30/2021 1019  ? [place] 88 09/13/2018 1006  ? LDLDIRECT 100.0 08/30/2021 1019  ? ? ? ?Risk Assessment/Calculations:   ?  ? ?    ? ?Physical Exam:   ? ?VS:  BP 108/70 (BP Location: Left Arm, Patient Position: Sitting, Cuff Size: Normal)   Pulse 71   Ht '5\' 3"'$  (1.6 m)   Wt 184 lb 12.8 oz (83.8 kg)   SpO2 98%   BMI 32.74  kg/m?    ? ?Wt Readings from Last 3 Encounters:  ?02/28/22 184 lb 12.8 oz (83.8 kg)  ?09/09/21 190 lb (86.2 kg)  ?08/14/21 190 lb  6.4 oz (86.4 kg)  ?  ? ?GEN:  Well nourished, well developed in no acute distress ?HEENT: Normal ?NECK: No JVD; No carotid bruits ?LYMPHATICS: No lymphadenopathy ?CARDIAC: RRR, no murmurs, rubs, gallops ?RESPIRATORY:  Clear to auscultation without rales, wheezing or rhonchi  ?ABDOMEN: Soft, non-tender, non-distended ?MUSCULOSKELETAL:  No edema; No deformity  ?SKIN: Warm and dry ?NEUROLOGIC:  Alert and oriented x 3 ?PSYCHIATRIC:  Normal affect  ? ?ASSESSMENT:   ? ?1. Chest pain of uncertain etiology   ?2. DOE (dyspnea on exertion)   ? ?PLAN:   ? ?In order of problems listed above: ? ?Ongoing chest pain and shortness of breath: And is seen today for evaluation of ongoing chest pain and shortness of breath.  She was seen by Dr. Stanford Breed in 2019 for similar pains.  She was not able to do a coronary CT angiogram because she had a reaction to the metoprolol.  [place] study was normal/low risk. ?Despite this, she is continue to have episodes of chest discomfort and shortness of breath. ? ?I would like to do a cardiac PET scan for further and more complete evaluation. ? ?2.  Hypertriglyceridemia: Her triglyceride levels have been as high as 528.  She admits to eating more carbs than she should on occasion.  Her triglyceride level has decreased to the 200 level.  I encouraged her to avoid foods that are white, weak, sweet.  I encouraged her to start a regular exercise program and to work on some weight loss. ? ?She may need to look into getting a Physiological scientist. ? ?See her again in 6 months for a follow-up visit. ? ?   ? ?Shared Decision Making/Informed Consent{ ? ?The risks [chest pain, shortness of breath, cardiac arrhythmias, dizziness, blood pressure fluctuations, myocardial infarction, stroke/transient ischemic attack, nausea, vomiting, allergic reaction, radiation exposure,  metallic taste sensation and life-threatening complications (estimated to be 1 in 10,000)], benefits (risk stratification, diagnosing coronary artery disease, treatment guidance) and alternatives of a cardiac PET

## 2022-02-28 ENCOUNTER — Ambulatory Visit: Payer: Managed Care, Other (non HMO) | Admitting: Cardiovascular Disease

## 2022-02-28 ENCOUNTER — Encounter: Payer: Self-pay | Admitting: Cardiovascular Disease

## 2022-02-28 VITALS — BP 108/70 | HR 71 | Ht 63.0 in | Wt 184.8 lb

## 2022-02-28 DIAGNOSIS — R079 Chest pain, unspecified: Secondary | ICD-10-CM

## 2022-02-28 DIAGNOSIS — R0609 Other forms of dyspnea: Secondary | ICD-10-CM | POA: Diagnosis not present

## 2022-02-28 NOTE — Patient Instructions (Addendum)
Medication Instructions:  ?Your physician recommends that you continue on your current medications as directed. Please refer to the Current Medication list given to you today. ? ?*If you need a refill on your cardiac medications before your next appointment, please call your pharmacy* ? ?Lab Work: ?NONE ? ?Testing/Procedures: ?Your physician has recommended for you to have a coronary calcium score and a cardiac PET scan. The scheduler from the radiology department will call you to schedule an appointment date and time for the cardiac PET scan. ? ?Follow-Up: ?At [facility], you and your health needs are our priority.  As part of our continuing mission to provide you with exceptional heart care, we have created designated Provider Care Teams.  These Care Teams include your primary Cardiologist (physician) and Advanced Practice Providers (APPs -  Physician Assistants and Nurse Practitioners) who all work together to provide you with the care you need, when you need it. ? ?Your next appointment:   ?6 month(s) ? ?The format for your next appointment:   ?In Person ? ?Provider:   ?Mertie Moores, MD { ? ?Other Instructions ?How to Prepare for Your Cardiac PET/CT Stress Test: ? ?1. Please do not take these medications before your test:  ? ?HOLD Metformin morning of test. ?Theophylline containing medications for 12 hours. ?Dipyridamole 48 hours prior to the test. ?Your remaining medications may be taken with water. ? ?2. Nothing to eat or drink, except water, 3 hours prior to arrival time.   ?NO caffeine/decaffeinated products, or chocolate 12 hours prior to arrival. ? ?3. NO perfume, cologne or lotion ? ?4. Total time is 1 to 2 hours; you may want to bring reading material for the waiting time. ? ?5. Please report to Admitting at the [place] Entr[facility] 60 minutes early for your test. ? [address] ? [place], [place] [address] ? ?Diabetic Preparation:  ?Hold oral medications. ?You may take NPH and  Lantus insulin. ?Do not take Humalog or Humulin R (Regular Insulin) the day of your test. ?Check blood sugars prior to leaving the house. ?If able to eat breakfast prior to 3 hour fasting, you may take all medications, including your insulin, ?Do not worry if you miss your breakfast dose of insulin - start at your next meal. ? ?IF YOU THINK YOU MAY BE PREGNANT, OR ARE NURSING PLEASE INFORM THE TECHNOLOGIST. ? ?In preparation for your appointment, medication and supplies will be purchased.  Appointment availability is limited, so if you need to cancel or reschedule, please call the Radiology Department at [phone]  24 hours in advance to avoid a cancellation fee of $100.00 ? ?What to Expect After you Arrive: ? ?Once you arrive and check in for your appointment, you will be taken to a preparation room within the Radiology Department.  A technologist or Nurse will obtain your medical history, verify that you are correctly prepped for the exam, and explain the procedure.  Afterwards,  an IV will be started in your arm and electrodes will be placed on your skin for EKG monitoring during the stress portion of the exam. Then you will be escorted to the PET/CT scanner.  There, staff will get you positioned on the scanner and obtain a blood pressure and EKG.  During the exam, you will continue to be connected to the EKG and blood pressure machines.  A small, safe amount of a radioactive tracer will be injected in your IV to obtain a series of pictures of your heart along with  an injection of a stress agent.   ? ?After your Exam: ? ?It is recommended that you eat a meal and drink a caffeinated beverage to counter act any effects of the stress agent.  Drink plenty of fluids for the remainder of the day and urinate frequently for the first couple of hours after the exam.  Your doctor will inform you of your test results within 7-10 business days. ? ?For questions about your test or how to prepare for your test, please  call: ?Marchia Bond, Cardiac Imaging Nurse Navigator  ?Gordy Clement, Cardiac Imaging Nurse Navigator ?Office: [phone] ? ? ?    Mediterranean Diet ? ?Why follow it? Research shows? ?Those who follow the Mediterranean diet have a reduced risk of heart disease  ?The diet is associated with a reduced incidence of Parkinson's and Alzheimer's diseases ?People following the diet may have longer life expectancies and lower rates of chronic diseases  ?The Dietary Guidelines for Americans recommends the Mediterranean diet as an eating plan to promote health and prevent disease ? ?What Is the Mediterranean Diet?  ?Healthy eating plan based on typical foods and recipes of Mediterranean-style cooking ?The diet is primarily a plant based diet; these foods should make up a majority of meals  ? ?Starches - Plant based foods should make up a majority of meals ?- They are an important sources of vitamins, minerals, energy, antioxidants, and fiber ?- Choose whole grains, foods high in fiber and minimally processed items  ?- Typical grain sources include wheat, oats, barley, corn, brown rice, bulgar, farro, millet, polenta, couscous  ?- Various types of beans include chickpeas, lentils, fava beans, black beans, white beans   ?Fruits  Veggies - Large quantities of antioxidant rich fruits & veggies; 6 or more servings  ?- Vegetables can be eaten raw or lightly drizzled with oil and cooked  ?- Vegetables common to the traditional Mediterranean Diet include: artichokes, arugula, beets, broccoli, brussel sprouts, cabbage, carrots, celery, collard greens, cucumbers, eggplant, kale, leeks, lemons, lettuce, mushrooms, okra, onions, peas, peppers, potatoes, pumpkin, radishes, rutabaga, shallots, spinach, sweet potatoes, turnips, zucchini ?- Fruits common to the Mediterranean Diet include: apples, apricots, avocados, cherries, clementines, dates, figs, grapefruits, grapes, melons, nectarines, oranges, peaches, pears, pomegranates,  strawberries, tangerines  ?Fats - Replace butter and margarine with healthy oils, such as olive oil, canola oil, and tahini  ?- Limit nuts to no more than a handful a day  ?- Nuts include walnuts, almonds, pecans, pistachios, pine nuts  ?- Limit or avoid candied, honey roasted or heavily salted nuts ?- Olives are central to the Mediterranean diet - can be eaten whole or used in a variety of dishes   ?Meats Protein - Limiting red meat: no more than a few times a month ?- When eating red meat: choose lean cuts and keep the portion to the size of deck of cards ?- Eggs: approx. 0 to 4 times a week  ?- Fish and lean poultry: at least 2 a week  ?- Healthy protein sources include, chicken, [place], lean beef, lamb ?- Increase intake of seafood such as tuna, salmon, trout, mackerel, shrimp, scallops ?- Avoid or limit high fat processed meats such as sausage and bacon  ?Dairy - Include moderate amounts of low fat dairy products  ?- Focus on healthy dairy such as fat free yogurt, skim milk, low or reduced fat cheese ?- Limit dairy products higher in fat such as whole or 2% milk, cheese, ice cream  ?Alcohol - Moderate amounts  of red wine is ok  ?- No more than 5 oz daily for women (all ages) and men older than age 44  ?- No more than 10 oz of wine daily for men younger than 13  ?Other - Limit sweets and other desserts  ?- Use herbs and spices instead of salt to flavor foods  ?- Herbs and spices common to the traditional Mediterranean Diet include: basil, bay leaves, chives, cloves, cumin, fennel, garlic, lavender, marjoram, mint, oregano, parsley, pepper, rosemary, sage, savory, sumac, tarragon, thyme  ? ?It?s not just a diet, it?s a lifestyle:  ?The Mediterranean diet includes lifestyle factors typical of those in the region  ?Foods, drinks and meals are best eaten with others and savored ?Daily physical activity is important for overall good health ?This could be strenuous exercise like running and aerobics ?This could also  be more leisurely activities such as walking, housework, yard-work, or taking the stairs ?Moderation is the key; a balanced and healthy diet accommodates most foods and drinks ?Consider portion sizes and frequency of

## 2022-03-13 ENCOUNTER — Other Ambulatory Visit: Payer: Self-pay | Admitting: Endocrinology

## 2022-03-24 ENCOUNTER — Telehealth (HOSPITAL_COMMUNITY): Payer: Self-pay | Admitting: *Deleted

## 2022-03-24 NOTE — Telephone Encounter (Signed)
Attempted to call patient regarding upcoming cardiac PET appointment. Left message on voicemail with name and callback number  Aviyah Swetz RN Navigator Cardiac Imaging [facility] [phone] Office [phone] Cell  

## 2022-03-24 NOTE — Telephone Encounter (Signed)
Patient returning call regarding upcoming cardiac imaging study; pt verbalizes understanding of appt date/time, parking situation and where to check in, pre-test NPO status and medications ordered; name and call back number provided for further questions should they arise ? ?Gordy Clement RN Navigator Cardiac Imaging ?[facility] ?[phone] office ?[phone] cell ? ?Patient aware to avoid caffeine 12 hours prior to her cardiac PET scan. ?

## 2022-03-25 ENCOUNTER — Ambulatory Visit (HOSPITAL_COMMUNITY)
Admission: RE | Admit: 2022-03-25 | Discharge: 2022-03-25 | Disposition: A | Discharge status: Home / Self Care | Payer: Commercial Managed Care - HMO | Source: Ambulatory Visit | Attending: Cardiovascular Disease | Admitting: Cardiovascular Disease

## 2022-03-25 DIAGNOSIS — R0609 Other forms of dyspnea: Secondary | ICD-10-CM | POA: Diagnosis not present

## 2022-03-25 DIAGNOSIS — R079 Chest pain, unspecified: Secondary | ICD-10-CM | POA: Diagnosis not present

## 2022-03-25 MED ORDER — REGADENOSON 0.4 MG/5ML IV SOLN
INTRAVENOUS | Status: AC
  Administered 2022-03-25: 0.4 mg via INTRAVENOUS
  Filled 2022-03-25: qty 5

## 2022-03-25 MED ORDER — RUBIDIUM RB82 GENERATOR (RUBYFILL)
21.7000 | PACK | Freq: Once | INTRAVENOUS | Status: AC
  Administered 2022-03-25: 21.7 via INTRAVENOUS

## 2022-03-25 MED ORDER — SODIUM CHLORIDE 0.9 % IV BOLUS
500.0000 mL | Freq: Once | INTRAVENOUS | Status: AC
  Administered 2022-03-25: 500 mL via INTRAVENOUS

## 2022-03-25 MED ORDER — RUBIDIUM RB82 GENERATOR (RUBYFILL)
21.6500 | PACK | Freq: Once | INTRAVENOUS | Status: AC
  Administered 2022-03-25: 21.65 via INTRAVENOUS

## 2022-03-25 MED ORDER — REGADENOSON 0.4 MG/5ML IV SOLN
INTRAVENOUS | Status: AC
  Administered 2022-03-25: 0.4 mg
  Filled 2022-03-25: qty 5

## 2022-03-26 LAB — NM PET CT CARDIAC PERFUSION MULTI W/ABSOLUTE BLOODFLOW
LV dias vol: 69 mL (ref 46–106)
LV sys vol: 19 mL
MBFR: 4.09
Nuc Rest EF: 67 %
Nuc Stress EF: 72 %
Peak HR: 101 {beats}/min
Rest HR: 71 {beats}/min
Rest MBF: 0.64 ml/g/min
Rest Nuclear Isotope Dose: 21.7 mCi
Rest perfusion cavity size (mL): 72 mL
ST Depression (mm): 2 mm
Stress MBF: 2.62 ml/g/min
Stress Nuclear Isotope Dose: 21.7 mCi
Stress perfusion cavity size (mL): 69 mL
TID: 0.84

## 2022-04-05 NOTE — Progress Notes (Unsigned)
09/09/21- 61 yoF never smoker for sleep evaluation coming to re-establish for OSA Medical problem list includes PAFib, OSA, Asthma, GERD, Multinodular Goiter, DM2, Osteopenia, Nephrolithiasis,  NPSG 07/08/13- Mild OSA AHI  7.1/hr, Desat to 89%, Epworth 14/24, weight 185 Lbs.  Had CPAP auto 5-15/ [facility] at [place] in 2014.(For snoring and fatigue) Epworth score- Body weight today-190 lbs Covid vax-2 Phizer, 1 Moderna Flu vax- She comes with her husband today. Has CPAP 7 and reports machine about 59 years old. No card. Reports she sleeps well with it and no problems. We will need to get download capability installed. She would like our help with asthma. Wheezes frequently. Thinks worse since daughter moved in with 4 cats. Has pending appointment with Dr Fredderick Phenix for Allergy assessment. No inhalers. Has PAfib, avoids caffeine. Episodes about 1x/ week. We discussed potential for albuterol to stimulate. She reports both Channel blockers and Beta blockrss cause rash.  ENT surgery - tonsils.Septoplasty.  04/08/22- 61 yoF never smoker followed for OSA, Asthma,  complicated by PAFib, OSA,  GERD, Multinodular Goiter, DM2, Osteopenia, Nephrolithiasis,  -Flonase, Xopenex hfa, Allegra, Melatonin,  CPAP auto 6-8/ [facility]/[employer] Download compliance- 100%, AHI 1.3/ hr Body weight today-184 lbs Covid vax-3 Phizer, 1 Moderna -----Patient forgot SD card for download, Had CT scan in May and would like to go over it. It showed Mild dependent atelectasis in both lungs. Download reviewed.  Good compliance and control and she is very comfortable with CPAP. Family history of mother and 2 siblings with lymphoma was so she is understandably interested in imaging results.  I went over the lung CT component of her cardiac PET scan with her and discussed the significance of hilar node calcification and mild bibasilar atelectasis. She finds the Xopenex rescue inhaler very helpful and sufficient, used once or twice per week. PET  Cardiac Perfusion  Over-read 03/25/22- Lungs/Pleura: No pleural effusion or pneumothorax. Mild dependent atelectasis in both lungs. No confluent airspace opacity or suspicious nodularity. Upper abdomen: No significant findings in the visualized upper abdomen. Musculoskeletal/Chest wall: No chest wall mass or suspicious osseous findings within the visualized chest. IMPRESSION: No significant extracardiac findings within the visualized chest.   ROS-see HPI   + = positive Constitutional:    weight loss, night sweats, fevers, chills, fatigue, lassitude. HEENT:    headaches, difficulty swallowing, tooth/dental problems, sore throat,       sneezing, itching, ear ache, nasal congestion, post nasal drip, snoring CV:    chest pain, orthopnea, PND, swelling in lower extremities, anasarca,         dizziness, +palpitations Resp:   shortness of breath with exertion or at rest.                productive cough,   non-productive cough, coughing up of blood.              change in color of mucus.  wheezing.   Skin:    rash or lesions. GI:  No-   heartburn, indigestion, abdominal pain, nausea, vomiting, diarrhea,                 change in bowel habits, loss of appetite GU: dysuria, change in color of urine, no urgency or frequency.   flank pain. MS:   joint pain, stiffness, decreased range of motion, back pain. Neuro-     nothing unusual Psych:  change in mood or affect.  depression or anxiety.   memory loss.   OBJ- Physical Exam General- Alert, Oriented, Affect-appropriate, Distress-  none acute, + obese Skin- r+rosacea Lymphadenopathy- none Head- atraumatic            Eyes- Gross vision intact, PERRLA, conjunctivae and secretions clear            Ears- Hearing, canals-normal            Nose- Clear, no-Septal dev, mucus, polyps, erosion, perforation             Throat- Mallampati IV , mucosa clear , drainage- none, tonsils-absent, + teeth Neck- + anterior surgical scar, trachea midline, no stridor  , thyroid nl, carotid no bruit Chest - symmetrical excursion , unlabored           Heart/CV- RRR , no murmur , no gallop  , no rub, nl s1 s2                           - JVD- none , edema- none, stasis changes- none, varices- none           Lung- clear to P&A, wheeze- none, cough- none , dullness-none, rub- none           Chest wall-  Abd-  Br/ Gen/ Rectal- Not done, not indicated Extrem- cyanosis- none, clubbing, none, atrophy- none, strength- nl Neuro- grossly intact to observation

## 2022-04-08 ENCOUNTER — Ambulatory Visit (INDEPENDENT_AMBULATORY_CARE_PROVIDER_SITE_OTHER): Payer: Commercial Managed Care - HMO | Admitting: Internal Medicine

## 2022-04-08 ENCOUNTER — Encounter: Payer: Self-pay | Admitting: Internal Medicine

## 2022-04-08 DIAGNOSIS — G4733 Obstructive sleep apnea (adult) (pediatric): Secondary | ICD-10-CM | POA: Diagnosis not present

## 2022-04-08 DIAGNOSIS — J4530 Mild persistent asthma, uncomplicated: Secondary | ICD-10-CM | POA: Diagnosis not present

## 2022-04-08 NOTE — Patient Instructions (Signed)
We can continue CPAP auto 6-8  We can continue the inhaler  Please call if we can help

## 2022-04-08 NOTE — Assessment & Plan Note (Signed)
She is comfortable with CPAP and benefits with good compliance and control Plan-continue auto 6-8

## 2022-04-08 NOTE — Assessment & Plan Note (Signed)
Very mild uncomplicated.  She reports good control using rescue inhaler about twice a week.  We discussed parameters for adding a maintenance controller.  Xopenex minimizes potential stimulation for her atrial fibrillation. Plan-refill Xopenex HFA when needed

## 2022-04-16 ENCOUNTER — Other Ambulatory Visit: Payer: Managed Care, Other (non HMO)

## 2022-08-21 ENCOUNTER — Other Ambulatory Visit: Payer: Self-pay | Admitting: Obstetrics and Gynecology

## 2022-08-28 ENCOUNTER — Other Ambulatory Visit: Payer: Self-pay | Admitting: Obstetrics and Gynecology

## 2022-08-28 DIAGNOSIS — Z1231 Encounter for screening mammogram for malignant neoplasm of breast: Secondary | ICD-10-CM

## 2022-09-02 ENCOUNTER — Ambulatory Visit: Payer: Managed Care, Other (non HMO) | Admitting: Cardiovascular Disease

## 2022-09-09 ENCOUNTER — Ambulatory Visit: Payer: Managed Care, Other (non HMO) | Admitting: Cardiovascular Disease

## 2022-09-29 ENCOUNTER — Encounter: Payer: Self-pay | Admitting: Cardiovascular Disease

## 2022-09-29 NOTE — Progress Notes (Unsigned)
Cardiology Office Note:    Date:  09/30/2022   ID:  [name], DOB [birth year], MRN [id number]  PCP:  Burnis Medin, MD   [place] Providers Cardiologist:  Maylene Crocker     Referring MD: Burnis Medin, MD   Chief Complaint  Patient presents with   Chest Pain   Palpitations         History of Present Illness:  n  02/28/22    Meredith Finley is a 61 y.o. female with a hx of atrial fib.  She has previously seen Dr. Stanford Breed. She has a hx of PAF Hx of HLD,  has been referred to the lipid clinic in the past  Has chest pressure  and separate episodes of dysnea  Dyspnea when she lies down on her back  Does some exercise  Walks slowly  Has severe dyspnea when she tries to walk quickly  Does not cause chest pressure   She had similar symptoms in 2019 when she saw Dr. Stanford Breed.  They originally tried to get a coronary CT angiogram but she has a reaction to the metoprolol ( psoriatic rash - severe rash , took 6 weeks to develop , 6 weeks to resolved ) so they were not able to complete the study.  Has PAF only rarely now,  better if she keeps up with her hydration, limit caffeine, takes magnesium supplements,  proper rest .  Not on [place] is  1 ( female)   Had a normal Lexiscan myoview study in 2019 .  Has a history of hyperlipidemia.  In particular her triglycerides have been as high as 528.  Hemoglobin A1c was 5.8 in September, 2022.   Nov. 21, 2023 Meredith Finley is seen for follow up of her CP and DOE . 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[employer] )   They are getting an exercise bike soon   Past Medical History:  Diagnosis Date   A-fib [facility])    Allergy    Anxiety    Asthma    Cataract    Diabetes mellitus without complication ([place])    Gestational Diabetes   Endometriosis 1996    GERD (gastroesophageal reflux disease)    H/O osteopenia    HLD (hyperlipidemia)    Hx of varicella    NEPHROLITHIASIS 02/22/2009   Qualifier: Diagnosis of  By: Burt Knack CMA, Gerarda Fraction    Other specified congenital anomaly of kidney    Palpitations    Prediabetes    Sleep apnea    Transfusion history    placental abruption 1986   Tremor     Past Surgical History:  Procedure Laterality Date   ABDOMINAL HYSTERECTOMY     ABDOMINAL SURGERY     for congenital kidney abnormality   CARPAL TUNNEL RELEASE     CESAREAN SECTION  [id number]   LAPAROSCOPIC SALPINGO OOPHERECTOMY     LITHOTRIPSY  1992-1996   multiple   NOSE SURGERY     surgical fusion disk 5& 6      Current Medications: Current Meds  Medication Sig   cetirizine (ZYRTEC) 10 MG tablet Take 10 mg by mouth daily.   Cholecalciferol (VITAMIN D3) 2000 units capsule Take 2,000 Units by mouth daily.  Cyanocobalamin (VITAMIN B-12 PO) Take 1,500 mcg by mouth daily.   fexofenadine (ALLEGRA) 180 MG tablet 1 tablet swallow whole with water; do not take with fruit juices.   fluticasone (FLONASE SENSIMIST) 27.5 MCG/SPRAY nasal spray as needed. 1-2 sprays (1 spray in each nostril)   levalbuterol (XOPENEX HFA) 45 MCG/ACT inhaler Inhale 2 puffs into the lungs every 6 (six) hours as needed for wheezing.   Magnesium 250 MG TABS Take by mouth.   metFORMIN (GLUCOPHAGE-XR) 500 MG 24 hr tablet TAKE 1 TABLET(500 MG) BY MOUTH DAILY   Multiple Vitamin (MULTIVITAMIN) tablet Take 1 tablet by mouth daily.   Current Facility-Administered Medications for the 09/30/22 encounter (Office Visit) with Charmeka [place], Wonda Cheng, MD  Medication   0.9 %  sodium chloride infusion     Allergies:   Other, Penicillin g, Bystolic [nebivolol hcl], Ioxaglate, Ivp dye [iodinated contrast media], Metoprolol, and Niacin   Social History   Socioeconomic History   Marital status: Married    Spouse name: Not on file   Number of children: 2   Years of education:  Not on file   Highest education level: Not on file  Occupational History   Occupation: Psychiatrist: [employer]  Tobacco Use   Smoking status: Never   Smokeless tobacco: Never  Vaping Use   Vaping Use: Never used  Substance and Sexual Activity   Alcohol use: No   Drug use: No   Sexual activity: Yes    Birth control/protection: Surgical, Post-menopausal  Other Topics Concern   Not on file  Social History Narrative   Usually gets 9 hours of sleep per night   4 people living in the home.   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    No pets            Social Determinants of Radio broadcast assistant Strain: Not on file  Food Insecurity: Not on file  Transportation Needs: Not on file  Physical Activity: Not on file  Stress: Not on file  Social Connections: Not on file     Family History: The patient's family history includes Arthritis in her father; Cancer in her mother; Colon cancer (age of onset: 53) in her mother; Hashimoto's thyroiditis in her daughter; Lung cancer in her mother; Lymphoma (age of onset: 22) in her brother; Obesity in her brother; Osteoporosis in her maternal grandmother; Scoliosis in her father; Tremor in her mother; Vision loss in her father. There is no history of Esophageal cancer, Rectal cancer, or Stomach cancer.  ROS:   Please see the history of present illness.     All other systems reviewed and are negative.  EKGs/Labs/Other Studies Reviewed:    The following studies were reviewed today:   EKG:   Recent Labs: No results found for requested labs within last 365 days.  Recent Lipid Panel    Component Value Date/Time   CHOL 164 08/30/2021 1019   CHOL 181 09/13/2018 1006   TRIG 205.0 (H) 08/30/2021 1019   HDL 40.70 08/30/2021 1019   HDL 34 (L) 09/13/2018 1006   CHOLHDL 4 08/30/2021 1019   VLDL 41.0 (H) 08/30/2021 1019   LDLCALC 88 09/13/2018 1006   LDLDIRECT 100.0  08/30/2021 1019     Risk Assessment/Calculations:           Physical Exam:    Physical Exam: Blood pressure 102/62, pulse 78, height 5'  3" (1.6 m), weight 180 lb 6.4 oz (81.8 kg), SpO2 97 %.       GEN:  moderately obese, very pleasant female,  in no acute distress HEENT: Normal NECK: No JVD; No carotid bruits LYMPHATICS: No lymphadenopathy CARDIAC: RRR, no murmurs, rubs, gallops RESPIRATORY:  Clear to auscultation without rales, wheezing or rhonchi  ABDOMEN: Soft, non-tender, non-distended MUSCULOSKELETAL:  No edema; No deformity  SKIN: Warm and dry NEUROLOGIC:  Alert and oriented x 3   ASSESSMENT:    1. Paroxysmal atrial fibrillation (HCC)     PLAN:      Ongoing chest pain and shortness of breath:  Overall this appears to be improving .  No further CP Cardiac PET scan showed no ischemia, no infarction    Cont current plan for weight loss. She is starting to exercise     2.  Hypertriglyceridemia:   3.  PAF :  CHADS2VASC is only 1 ( female )  Encourage her to work on weight loss She has OSA.  Uses her CPAP        Shared Decision Making/Informed Consent{  The risks [chest pain, shortness of breath, cardiac arrhythmias, dizziness, blood pressure fluctuations, myocardial infarction, stroke/transient ischemic attack, nausea, vomiting, allergic reaction, radiation exposure, metallic taste sensation and life-threatening complications (estimated to be 1 in 10,000)], benefits (risk stratification, diagnosing coronary artery disease, treatment guidance) and alternatives of a cardiac PET stress test were discussed in detail with Ms. Bricco and she agrees to proceed.    Medication Adjustments/Labs and Tests Ordered: Current medicines are reviewed at length with the patient today.  Concerns regarding medicines are outlined above.  No orders of the defined types were placed in this encounter.  No orders of the defined types were placed in this encounter.   Patient  Instructions  Medication Instructions:  Your physician recommends that you continue on your current medications as directed. Please refer to the Current Medication list given to you today.  *If you need a refill on your cardiac medications before your next appointment, please call your pharmacy*   Lab Work: NONE If you have labs (blood work) drawn today and your tests are completely normal, you will receive your results only by: [place] (if you have MyChart) OR A paper copy in the mail If you have any lab test that is abnormal or we need to change your treatment, we will call you to review the results.   Testing/Procedures: NONE   Follow-Up: At [facility], you and your health needs are our priority.  As part of our continuing mission to provide you with exceptional heart care, we have created designated Provider Care Teams.  These Care Teams include your primary Cardiologist (physician) and Advanced Practice Providers (APPs -  Physician Assistants and Nurse Practitioners) who all work together to provide you with the care you need, when you need it.  We recommend signing up for the patient portal called "MyChart".  Sign up information is provided on this After Visit Summary.  MyChart is used to connect with patients for Virtual Visits (Telemedicine).  Patients are able to view lab/test results, encounter notes, upcoming appointments, etc.  Non-urgent messages can be sent to your provider as well.   To learn more about what you can do with MyChart, go to [email or web address].    Your next appointment:   1 year(s)  The format for your next appointment:   In Person  Provider:   Mertie Moores, MD  Important Information About Sugar         Signed, Mertie Moores, MD  09/30/2022 4:05 PM    [facility]

## 2022-09-30 ENCOUNTER — Encounter: Payer: Self-pay | Admitting: Cardiovascular Disease

## 2022-09-30 ENCOUNTER — Ambulatory Visit: Payer: Commercial Managed Care - HMO | Attending: Cardiovascular Disease | Admitting: Cardiovascular Disease

## 2022-09-30 VITALS — BP 102/62 | HR 78 | Ht 63.0 in | Wt 180.4 lb

## 2022-09-30 DIAGNOSIS — I480 Paroxysmal atrial fibrillation: Secondary | ICD-10-CM | POA: Diagnosis not present

## 2022-09-30 NOTE — Patient Instructions (Signed)
Medication Instructions:  Your physician recommends that you continue on your current medications as directed. Please refer to the Current Medication list given to you today.  *If you need a refill on your cardiac medications before your next appointment, please call your pharmacy*   Lab Work: NONE If you have labs (blood work) drawn today and your tests are completely normal, you will receive your results only by: MyChart Message (if you have MyChart) OR A paper copy in the mail If you have any lab test that is abnormal or we need to change your treatment, we will call you to review the results.   Testing/Procedures: NONE   Follow-Up: At [facility], you and your health needs are our priority.  As part of our continuing mission to provide you with exceptional heart care, we have created designated Provider Care Teams.  These Care Teams include your primary Cardiologist (physician) and Advanced Practice Providers (APPs -  Physician Assistants and Nurse Practitioners) who all work together to provide you with the care you need, when you need it.  We recommend signing up for the patient portal called "MyChart".  Sign up information is provided on this After Visit Summary.  MyChart is used to connect with patients for Virtual Visits (Telemedicine).  Patients are able to view lab/test results, encounter notes, upcoming appointments, etc.  Non-urgent messages can be sent to your provider as well.   To learn more about what you can do with MyChart, go to [email or web address].    Your next appointment:   1 year(s)  The format for your next appointment:   In Person  Provider:   Philip Nahser, MD       Important Information About Sugar       

## 2022-10-23 ENCOUNTER — Ambulatory Visit
Admission: RE | Admit: 2022-10-23 | Discharge: 2022-10-23 | Disposition: A | Discharge status: Home / Self Care | Payer: Commercial Managed Care - HMO | Source: Ambulatory Visit | Attending: Obstetrics and Gynecology | Admitting: Obstetrics and Gynecology

## 2022-10-23 DIAGNOSIS — Z1231 Encounter for screening mammogram for malignant neoplasm of breast: Secondary | ICD-10-CM

## 2023-04-09 ENCOUNTER — Ambulatory Visit: Payer: Commercial Managed Care - HMO | Admitting: Internal Medicine

## 2023-05-05 ENCOUNTER — Ambulatory Visit: Payer: Medicaid Other | Admitting: Internal Medicine

## 2023-05-05 ENCOUNTER — Encounter: Payer: Self-pay | Admitting: Internal Medicine

## 2023-05-05 VITALS — BP 106/72 | HR 76 | Ht 63.0 in | Wt 178.2 lb

## 2023-05-05 DIAGNOSIS — E663 Overweight: Secondary | ICD-10-CM | POA: Diagnosis not present

## 2023-05-05 DIAGNOSIS — Z6828 Body mass index (BMI) 28.0-28.9, adult: Secondary | ICD-10-CM | POA: Diagnosis not present

## 2023-05-05 DIAGNOSIS — G4733 Obstructive sleep apnea (adult) (pediatric): Secondary | ICD-10-CM

## 2023-05-05 NOTE — Progress Notes (Signed)
HPI F never smoker followed for OSA, Asthma,  complicated by PAFib, OSA,  GERD, Multinodular Goiter, DM2, Osteopenia, Nephrolithiasis,  NPSG 07/08/13- Mild OSA AHI  7.1/hr, Desat to 89%, Epworth 14/24, weight 185 Lbs. ==============================================================   04/08/22- 61 yoF never smoker followed for OSA, Asthma,  complicated by PAFib, OSA,  GERD, Multinodular Goiter, DM2, Osteopenia, Nephrolithiasis,  -Flonase, Xopenex hfa, Allegra, Melatonin,  CPAP auto 6-8/ [employer] Download compliance- 100%, AHI 1.3/ hr Body weight today-184 lbs Covid vax-3 Phizer, 1 Moderna -----Patient forgot SD card for download, Had CT scan in May and would like to go over it. It showed Mild dependent atelectasis in both lungs. Download reviewed.  Good compliance and control and she is very comfortable with CPAP. Family history of mother and 2 siblings with lymphoma was so she is understandably interested in imaging results.  I went over the lung CT component of her cardiac PET scan with her and discussed the significance of hilar node calcification and mild bibasilar atelectasis. She finds the Xopenex rescue inhaler very helpful and sufficient, used once or twice per week. PET Cardiac Perfusion  Over-read 03/25/22- Lungs/Pleura: No pleural effusion or pneumothorax. Mild dependent atelectasis in both lungs. No confluent airspace opacity or suspicious nodularity. Upper abdomen: No significant findings in the visualized upper abdomen. Musculoskeletal/Chest wall: No chest wall mass or suspicious osseous findings within the visualized chest. IMPRESSION: No significant extracardiac findings within the visualized chest.  05/05/23- 61 yoF never smoker followed for OSA, Asthma,  complicated by PAFib, OSA,  GERD, Multinodular Goiter, DM2, Osteopenia, Nephrolithiasis,  -Flonase, Xopenex hfa, Allegra, Melatonin,  CPAP auto 6-8/ [employer] Download compliance- 100%, AHI 1.2/ hr Body weight  today-178 lbs Download reviewed.  She is doing quite well with no concerns except that she would like to change DME because of frustrations with communication and billing. Husband Elayna Fausto   ROS-see HPI   + = positive Constitutional:    weight loss, night sweats, fevers, chills, fatigue, lassitude. HEENT:    headaches, difficulty swallowing, tooth/dental problems, sore throat,       sneezing, itching, ear ache, nasal congestion, post nasal drip, snoring CV:    chest pain, orthopnea, PND, swelling in lower extremities, anasarca,         dizziness, +palpitations Resp:   shortness of breath with exertion or at rest.                productive cough,   non-productive cough, coughing up of blood.              change in color of mucus.  wheezing.   Skin:    rash or lesions. GI:  No-   heartburn, indigestion, abdominal pain, nausea, vomiting, diarrhea,                 change in bowel habits, loss of appetite GU: dysuria, change in color of urine, no urgency or frequency.   flank pain. MS:   joint pain, stiffness, decreased range of motion, back pain. Neuro-     nothing unusual Psych:  change in mood or affect.  depression or anxiety.   memory loss.   OBJ- Physical Exam General- Alert, Oriented, Affect-appropriate, Distress- none acute, + overweight Skin- r+rosacea Lymphadenopathy- none Head- atraumatic            Eyes- Gross vision intact, PERRLA, conjunctivae and secretions clear            Ears- Hearing, canals-normal  Nose- Clear, no-Septal dev, mucus, polyps, erosion, perforation             Throat- Mallampati IV , mucosa clear , drainage- none, tonsils-absent, + teeth Neck- + anterior surgical scar, trachea midline, no stridor , thyroid nl, carotid no bruit Chest - symmetrical excursion , unlabored           Heart/CV- RRR , no murmur , no gallop  , no rub, nl s1 s2                           - JVD- none , edema- none, stasis changes- none, varices- none           Lung-  clear to P&A, wheeze- none, cough- none , dullness-none, rub- none           Chest wall-  Abd-  Br/ Gen/ Rectal- Not done, not indicated Extrem- cyanosis- none, clubbing, none, atrophy- none, strength- nl Neuro- grossly intact to observation

## 2023-05-05 NOTE — Patient Instructions (Signed)
Order- [facility]- patient is requesting CPAP provider change for her and for husband Molly Maduro from [employer] to an alternative DME please

## 2023-05-12 ENCOUNTER — Encounter: Payer: Self-pay | Admitting: Gastroenterology

## 2023-06-17 ENCOUNTER — Encounter: Payer: Self-pay | Admitting: Internal Medicine

## 2023-06-17 DIAGNOSIS — E663 Overweight: Secondary | ICD-10-CM | POA: Insufficient documentation

## 2023-06-17 NOTE — Assessment & Plan Note (Signed)
Benefits from CPAP with good compliance and control Plan-continue auto 6-8.  Patient requests change of DME.

## 2023-06-17 NOTE — Assessment & Plan Note (Signed)
Continued efforts at diet/exercise

## 2023-07-14 ENCOUNTER — Encounter: Payer: Self-pay | Admitting: Internal Medicine

## 2023-08-04 ENCOUNTER — Ambulatory Visit: Payer: Medicaid Other | Admitting: Urology

## 2023-08-04 ENCOUNTER — Encounter: Payer: Self-pay | Admitting: Urology

## 2023-08-04 VITALS — BP 105/70 | HR 71 | Ht 63.0 in | Wt 175.0 lb

## 2023-08-04 DIAGNOSIS — N200 Calculus of kidney: Secondary | ICD-10-CM

## 2023-08-04 DIAGNOSIS — R8290 Unspecified abnormal findings in urine: Secondary | ICD-10-CM

## 2023-08-04 DIAGNOSIS — R3129 Other microscopic hematuria: Secondary | ICD-10-CM | POA: Insufficient documentation

## 2023-08-04 LAB — MICROSCOPIC EXAMINATION

## 2023-08-04 LAB — URINALYSIS, ROUTINE W REFLEX MICROSCOPIC
Bilirubin, UA: NEGATIVE
Glucose, UA: NEGATIVE
Ketones, UA: NEGATIVE
Nitrite, UA: NEGATIVE
Specific Gravity, UA: 1.025 (ref 1.005–1.030)
Urobilinogen, Ur: 0.2 mg/dL (ref 0.2–1.0)
pH, UA: 5.5 (ref 5.0–7.5)

## 2023-08-04 NOTE — Progress Notes (Signed)
Assessment: 1. Nephrolithiasis   2. Microscopic hematuria   3. Abnormal urine findings     Plan: I personally reviewed the patient's chart including provider notes, lab and imaging results. I personally reviewed the CT study from 05/19/2023 with results as noted below.  I discussed these results with the patient today.  She has a large branching calculus in the right superior pole, six 6 mm lower pole left renal calculus and a 12 mm upper pole left renal calculus, all nonobstructing. Management options for the renal calculi discussed, including observation, shockwave lithotripsy, ureteroscopic laser lithotripsy, and percutaneous nephrolithotomy. Urine culture sent today. Today I had a discussion with the patient regarding the findings of microscopic hematuria including the implications and differential diagnoses associated with it.  I also discussed recommendations for further evaluation including the rationale for upper tract imaging and cystoscopy.  I discussed the nature of these procedures including potential risk and complications.  The patient expressed an understanding of these issues. Schedule for cystoscopy in 2 weeks   Chief Complaint:  Chief Complaint  Patient presents with   Nephrolithiasis    History of Present Illness:  Meredith Finley is a 62 y.o. female who is seen in consultation from Panosh, Neta Mends, MD for evaluation of nephrolithiasis.  She has a history of nephrolithiasis undergoing shockwave lithotripsy and ureteroscopy in the 1990s.  She has not had any recent stone symptoms.  She recently underwent CT evaluation for microscopic hematuria.  Dipstick urinalysis in the office showed trace blood.  CT imaging from 05/19/2023 showed nonobstructing renal calculi bilaterally with a branching stone in the superior pole of the right kidney measuring 22 mm, and 2 nonobstructing stones in the left measuring 12 and 6 mm in size.  She also had a right upper pole renal cyst measuring 13  mm. She is not having any flank pain.  She does report some bilateral low back pain which is positional in nature.  No gross hematuria or dysuria.   Past Medical History:  Past Medical History:  Diagnosis Date   A-fib [facility])    Allergy    Anxiety    Asthma    Cataract    Diabetes mellitus without complication (HCC)    Gestational Diabetes   Endometriosis 1996   GERD (gastroesophageal reflux disease)    H/O osteopenia    HLD (hyperlipidemia)    Hx of varicella    NEPHROLITHIASIS 02/22/2009   Qualifier: Diagnosis of  By: Excell Seltzer CMA, Awilda Bill    Other specified congenital anomaly of kidney    Palpitations    Prediabetes    Sleep apnea    Transfusion history    placental abruption 1986   Tremor     Past Surgical History:  Past Surgical History:  Procedure Laterality Date   ABDOMINAL HYSTERECTOMY     ABDOMINAL SURGERY     for congenital kidney abnormality   CARPAL TUNNEL RELEASE     CESAREAN SECTION  1610,9604   LAPAROSCOPIC SALPINGO OOPHERECTOMY     LITHOTRIPSY  1992-1996   multiple   NOSE SURGERY     surgical fusion disk 5& 6      Allergies:  Allergies  Allergen Reactions   Beta Adrenergic Blockers Other (See Comments)    Fatigue,, Rash   Other Other (See Comments) and Rash    Fatigue, Rash    Penicillin G Rash    Other reaction(s): Unknown   Bystolic [Nebivolol Hcl]  Rash    Ioxaglate    Ivp Dye [Iodinated Contrast Media] Hives    Had a reaction to IV contrast in the late 1980s.   We discussed that the contrast used today is much better tolerated.   She should be able to tolerate a CT with contrast or cath if she needs.   I would give her pre medication ( steroids, benadryl, pepcid.    Metoprolol Rash and Other (See Comments)    Fatigue, soreactic rash   Niacin Nausea And Vomiting and Rash    Family History:  Family History  Problem Relation Age of Onset   Cancer Mother        Colon cancer, breast cancer and lung cancer   Tremor  Mother    Colon cancer Mother 10       died at 27   Lung cancer Mother        never smoked or around cig smoke   Arthritis Father    Vision loss Father        Macular degeneration   Scoliosis Father    Lymphoma Brother 13   Obesity Brother    Hashimoto's thyroiditis Daughter    Osteoporosis Maternal Grandmother    Esophageal cancer Neg Hx    Rectal cancer Neg Hx    Stomach cancer Neg Hx     Social History:  Social History   Tobacco Use   Smoking status: Never   Smokeless tobacco: Never  Vaping Use   Vaping status: Never Used  Substance Use Topics   Alcohol use: No   Drug use: No    Review of symptoms:  Constitutional:  Negative for unexplained weight loss, night sweats, fever, chills ENT:  Negative for nose bleeds, sinus pain, painful swallowing CV:  Negative for chest pain, shortness of breath, exercise intolerance, palpitations, loss of consciousness Resp:  Negative for cough, wheezing, shortness of breath GI:  Negative for nausea, vomiting, diarrhea, bloody stools GU:  Positives noted in HPI; otherwise negative for gross hematuria, dysuria, urinary incontinence Neuro:  Negative for seizures, poor balance, limb weakness, slurred speech Psych:  Negative for lack of energy, depression, anxiety Endocrine:  Negative for polydipsia, polyuria, symptoms of hypoglycemia (dizziness, hunger, sweating) Hematologic:  Negative for anemia, purpura, petechia, prolonged or excessive bleeding, use of anticoagulants  Allergic:  Negative for difficulty breathing or choking as a result of exposure to anything; no shellfish allergy; no allergic response (rash/itch) to materials, foods  Physical exam: BP 105/70   Pulse 71   Ht 5\' 3"  (1.6 m)   Wt 175 lb (79.4 kg)   BMI 31.00 kg/m  GENERAL APPEARANCE:  Well appearing, well developed, well nourished, NAD HEENT: Atraumatic, Normocephalic, oropharynx clear. NECK: Supple without lymphadenopathy or thyromegaly. LUNGS: Clear to auscultation  bilaterally. HEART: Regular Rate and Rhythm without murmurs, gallops, or rubs. ABDOMEN: Soft, non-tender, No Masses. EXTREMITIES: Moves all extremities well.  Without clubbing, cyanosis, or edema. NEUROLOGIC:  Alert and oriented x 3, normal gait, CN II-XII grossly intact.  MENTAL STATUS:  Appropriate. BACK:  Non-tender to palpation.  No CVAT SKIN:  Warm, dry and intact.    Results: U/A:  11-30 WBC, 3-10 RBC, many bacteria, nitrite negative

## 2023-08-06 LAB — URINE CULTURE

## 2023-08-10 ENCOUNTER — Telehealth: Payer: Self-pay | Admitting: Urology

## 2023-08-10 NOTE — Telephone Encounter (Signed)
Patient thinks she has a bladder infection. Wants to know what she should do or if something could be prescribed.

## 2023-08-11 ENCOUNTER — Other Ambulatory Visit: Payer: Medicaid Other

## 2023-08-11 ENCOUNTER — Other Ambulatory Visit: Payer: Self-pay

## 2023-08-11 DIAGNOSIS — R3129 Other microscopic hematuria: Secondary | ICD-10-CM

## 2023-08-11 DIAGNOSIS — N200 Calculus of kidney: Secondary | ICD-10-CM

## 2023-08-11 NOTE — Telephone Encounter (Signed)
Pt scheduled for lab appt for urine drop off.

## 2023-08-12 LAB — MICROSCOPIC EXAMINATION

## 2023-08-12 LAB — URINALYSIS, ROUTINE W REFLEX MICROSCOPIC
Bilirubin, UA: NEGATIVE
Glucose, UA: NEGATIVE
Ketones, UA: NEGATIVE
Nitrite, UA: NEGATIVE
Protein,UA: NEGATIVE
RBC, UA: NEGATIVE
Specific Gravity, UA: 1.01 (ref 1.005–1.030)
Urobilinogen, Ur: 0.2 mg/dL (ref 0.2–1.0)
pH, UA: 7 (ref 5.0–7.5)

## 2023-08-17 ENCOUNTER — Other Ambulatory Visit: Payer: Medicaid Other | Admitting: Urology

## 2023-08-17 ENCOUNTER — Encounter: Payer: Self-pay | Admitting: Urology

## 2023-08-17 ENCOUNTER — Other Ambulatory Visit: Payer: Self-pay | Admitting: Urology

## 2023-08-17 DIAGNOSIS — R8290 Unspecified abnormal findings in urine: Secondary | ICD-10-CM

## 2023-08-17 MED ORDER — NITROFURANTOIN MONOHYD MACRO 100 MG PO CAPS: 100.0000 mg | ORAL_CAPSULE | Freq: Two times a day (BID) | ORAL | 0 refills | Status: AC

## 2023-08-17 NOTE — Progress Notes (Deleted)
Assessment: 1. Nephrolithiasis   2. Microscopic hematuria     Plan: I personally reviewed the CT study from 05/19/2023 with results as noted below.  I discussed these results with the patient today.  She has a large branching calculus in the right superior pole, six 6 mm lower pole left renal calculus and a 12 mm upper pole left renal calculus, all nonobstructing. Management options for the renal calculi discussed, including observation, shockwave lithotripsy, ureteroscopic laser lithotripsy, and percutaneous nephrolithotomy.  Today I had a discussion with the patient regarding the findings of microscopic hematuria including the implications and differential diagnoses associated with it.  I also discussed recommendations for further evaluation including the rationale for upper tract imaging and cystoscopy.  I discussed the nature of these procedures including potential risk and complications.  The patient expressed an understanding of these issues. Schedule for cystoscopy in 2 weeks  Chief Complaint:  No chief complaint on file.   History of Present Illness:  Meredith Finley is a 62 y.o. female who is seen for further evaluation of microscopic hematuria and nephrolithiasis.   She has a history of nephrolithiasis undergoing shockwave lithotripsy and ureteroscopy in the 1990s.  She has not had any recent stone symptoms.  She recently underwent CT evaluation for microscopic hematuria.  Dipstick urinalysis in the office showed trace blood.  CT imaging from 05/19/2023 showed nonobstructing renal calculi bilaterally with a branching stone in the superior pole of the right kidney measuring 22 mm, and 2 nonobstructing stones in the left measuring 12 and 6 mm in size.  She also had a right upper pole renal cyst measuring 13 mm. Urine culture from 9/24:  10-25K mixed flora She presents today for cystoscopy for evaluation of microscopic hematuria.  She is not having any flank pain.  She does report some  bilateral low back pain which is positional in nature.  No gross hematuria or dysuria.  Portions of the above documentation were copied from a prior visit for review purposes only.  Past Medical History:  Past Medical History:  Diagnosis Date   A-fib [facility])    Allergy    Anxiety    Asthma    Cataract    Diabetes mellitus without complication (HCC)    Gestational Diabetes   Endometriosis 1996   GERD (gastroesophageal reflux disease)    H/O osteopenia    HLD (hyperlipidemia)    Hx of varicella    NEPHROLITHIASIS 02/22/2009   Qualifier: Diagnosis of  By: Excell Seltzer CMA, Awilda Bill    Other specified congenital anomaly of kidney    Palpitations    Prediabetes    Sleep apnea    Transfusion history    placental abruption 1986   Tremor     Past Surgical History:  Past Surgical History:  Procedure Laterality Date   ABDOMINAL HYSTERECTOMY     ABDOMINAL SURGERY     for congenital kidney abnormality   CARPAL TUNNEL RELEASE     CESAREAN SECTION  [id number]   LAPAROSCOPIC SALPINGO OOPHERECTOMY     LITHOTRIPSY  1992-1996   multiple   NOSE SURGERY     surgical fusion disk 5& 6      Allergies:  Allergies  Allergen Reactions   Beta Adrenergic Blockers Other (See Comments)    Fatigue,, Rash   Other Other (See Comments) and Rash    Fatigue, Rash    Penicillin G Rash    Other reaction(s): Unknown   Bystolic [Nebivolol Hcl]  Rash    Ioxaglate    Ivp Dye [Iodinated Contrast Media] Hives    Had a reaction to IV contrast in the late 1980s.   We discussed that the contrast used today is much better tolerated.   She should be able to tolerate a CT with contrast or cath if she needs.   I would give her pre medication ( steroids, benadryl, pepcid.    Metoprolol Rash and Other (See Comments)    Fatigue, soreactic rash   Niacin Nausea And Vomiting and Rash    Family History:  Family History  Problem Relation Age of Onset   Cancer Mother        Colon cancer, breast  cancer and lung cancer   Tremor Mother    Colon cancer Mother 32       died at 39   Lung cancer Mother        never smoked or around cig smoke   Arthritis Father    Vision loss Father        Macular degeneration   Scoliosis Father    Lymphoma Brother 21   Obesity Brother    Hashimoto's thyroiditis Daughter    Osteoporosis Maternal Grandmother    Esophageal cancer Neg Hx    Rectal cancer Neg Hx    Stomach cancer Neg Hx     Social History:  Social History   Tobacco Use   Smoking status: Never   Smokeless tobacco: Never  Vaping Use   Vaping status: Never Used  Substance Use Topics   Alcohol use: No   Drug use: No    ROS: Constitutional:  Negative for fever, chills, weight loss CV: Negative for chest pain, previous MI, hypertension Respiratory:  Negative for shortness of breath, wheezing, sleep apnea, frequent cough GI:  Negative for nausea, vomiting, bloody stool, GERD  Physical exam: There were no vitals taken for this visit. GENERAL APPEARANCE:  Well appearing, well developed, well nourished, NAD HEENT:  Atraumatic, normocephalic, oropharynx clear NECK:  Supple without lymphadenopathy or thyromegaly ABDOMEN:  Soft, non-tender, no masses EXTREMITIES:  Moves all extremities well, without clubbing, cyanosis, or edema NEUROLOGIC:  Alert and oriented x 3, normal gait, CN II-XII grossly intact MENTAL STATUS:  appropriate BACK:  Non-tender to palpation, No CVAT SKIN:  Warm, dry, and intact  Results: U/A:

## 2023-08-26 IMAGING — PT NM PET CT CARDIAC PERF MULTI W/ABSOLUTE BLOODFLOW
1 of 16 series · 1 of 25 positions shown · non-contrast
Comparison: Abdominal CT 06/18/2013.

EXAM:
OVER-READ INTERPRETATION  CT CHEST

The following report is a limited chest CT over-read performed by
03/25/2022. This over-read does not include interpretation of cardiac
or coronary anatomy or pathology nor does it include evaluation of
the PET data. The PET-CT interpretation by the cardiologist is
attached.

[Series 3: ct rest · axial · 3.0mm · 0.96mm/px · 1 of 83 slices shown]
[im 1/83  soft-tissue]
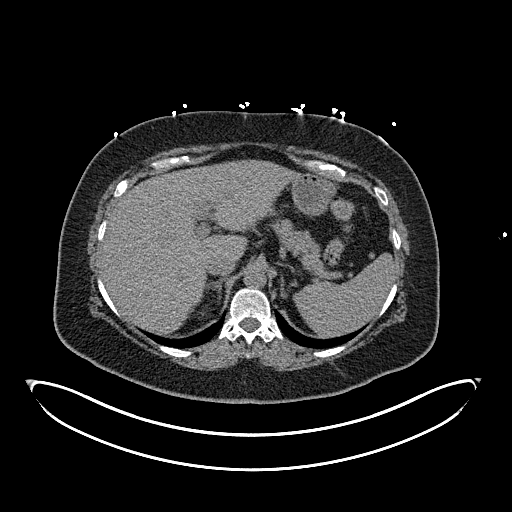

[1 of 25 positions shown; findings below may reference images not displayed]

FINDINGS: Cardiovascular: No significant extracardiac vascular findings.

Mediastinum/Nodes: Calcified left hilar lymph node. No enlarged
noncalcified mediastinal, hilar or axillary lymph nodes identified.

Lungs/Pleura: No pleural effusion or pneumothorax. Mild dependent
atelectasis in both lungs. No confluent airspace opacity or
suspicious nodularity.

Upper abdomen: No significant findings in the visualized upper
abdomen.

Musculoskeletal/Chest wall: No chest wall mass or suspicious osseous
findings within the visualized chest.
IMPRESSION: No significant extracardiac findings within the visualized chest.

## 2023-09-02 ENCOUNTER — Encounter: Payer: Self-pay | Admitting: Urology

## 2023-09-02 ENCOUNTER — Ambulatory Visit: Payer: Medicaid Other | Admitting: Urology

## 2023-09-02 VITALS — BP 108/67 | HR 70 | Ht 63.0 in | Wt 177.0 lb

## 2023-09-02 DIAGNOSIS — N200 Calculus of kidney: Secondary | ICD-10-CM | POA: Diagnosis not present

## 2023-09-02 DIAGNOSIS — Z87440 Personal history of urinary (tract) infections: Secondary | ICD-10-CM | POA: Diagnosis not present

## 2023-09-02 DIAGNOSIS — R3129 Other microscopic hematuria: Secondary | ICD-10-CM | POA: Diagnosis not present

## 2023-09-02 MED ORDER — CIPROFLOXACIN HCL 500 MG PO TABS
500.0000 mg | ORAL_TABLET | Freq: Once | ORAL | Status: AC
  Administered 2023-09-02: 500 mg via ORAL

## 2023-09-02 NOTE — Progress Notes (Signed)
Assessment: 1. Microscopic hematuria   2. Nephrolithiasis   3. History of UTI     Plan: I discussed the findings on cystoscopy with the patient today.  No evidence of urethral or bladder abnormalities. Cipro x 1 today following cystoscopy. I reviewed her CT from July 2024.  She has a 2 cm branching calculus in the right superior pole, six 6 mm lower pole left renal calculus and a 12 mm upper pole left renal calculus, all nonobstructing. Management options for the renal calculi discussed, including observation, shockwave lithotripsy, ureteroscopic laser lithotripsy, and percutaneous nephrolithotomy. I discussed the pros and cons of each treatment modality.  I advised her that given the stone size, I would recommend placement of a ureteral stent prior to shockwave lithotripsy to hopefully decrease the risk of obstructing fragments.  I also advised her that shockwave lithotripsy and ureteroscopic laser lithotripsy may require staged procedures due to the stone size.  I advised her that a percutaneous nephrolithotomy would offer her the best chance for successful removal of the stone in 1 treatment, although additional treatments may be necessary including sandwich therapy with shockwave lithotripsy.  I offered her a referral to 1 of my colleagues in [place] who performs percutaneous nephrolithotomy's on a regular basis.  She would like to consider her options at this time and will let me know her decision regarding moving forward with treatment. She will contact me with her decision.   Chief Complaint:  Chief Complaint  Patient presents with   Cysto    History of Present Illness:  Meredith Finley is a 62 y.o. female who is seen for further evaluation of microscopic hematuria and nephrolithiasis.   She has a history of nephrolithiasis undergoing shockwave lithotripsy and ureteroscopy in the 1990s.  She has not had any recent stone symptoms.  She recently underwent CT evaluation for microscopic  hematuria.  Dipstick urinalysis in the office showed trace blood.   CT imaging from 05/19/2023 showed nonobstructing renal calculi bilaterally with a branching stone in the superior pole of the right kidney measuring 22 mm, and 2 nonobstructing stones in the left measuring 12 and 6 mm in size.  She also had a right upper pole renal cyst measuring 13 mm. Urine culture from 9/24:  10-25K mixed flora She developed UTI symptoms and had a Resolve MDx urine culture sent on 08/11/23 which grew E. Coli and enterococcus. She was treated with Macrobid x 10 days.  Her symptoms resolved.  She presents today for cystoscopy for further evaluation of microscopic hematuria. No flank pain.  No gross hematuria or dysuria.  No current UTI symptoms.  Portions of the above documentation were copied from a prior visit for review purposes only.  Past Medical History:  Past Medical History:  Diagnosis Date   A-fib [facility])    Allergy    Anxiety    Asthma    Cataract    Diabetes mellitus without complication (HCC)    Gestational Diabetes   Endometriosis 1996   GERD (gastroesophageal reflux disease)    H/O osteopenia    HLD (hyperlipidemia)    Hx of varicella    NEPHROLITHIASIS 02/22/2009   Qualifier: Diagnosis of  By: Excell Seltzer CMA, Awilda Bill    Other specified congenital anomaly of kidney    Palpitations    Prediabetes    Sleep apnea    Transfusion history    placental abruption 1986   Tremor     Past Surgical History:  Past Surgical History:  Procedure Laterality Date   ABDOMINAL HYSTERECTOMY     ABDOMINAL SURGERY     for congenital kidney abnormality   CARPAL TUNNEL RELEASE     CESAREAN SECTION  1610,9604   LAPAROSCOPIC SALPINGO OOPHERECTOMY     LITHOTRIPSY  1992-1996   multiple   NOSE SURGERY     surgical fusion disk 5& 6      Allergies:  Allergies  Allergen Reactions   Beta Adrenergic Blockers Other (See Comments)    Fatigue,, Rash   Niacin Nausea And Vomiting and Rash    Other  Reaction(s): GI Intolerance   Other Other (See Comments) and Rash    Fatigue, Rash    Penicillin G Rash    Other reaction(s): Unknown   Bystolic [Nebivolol Hcl]     Rash    Iohexol     Other Reaction(s): Not available   Ioxaglate    Ivp Dye [Iodinated Contrast Media] Hives    Had a reaction to IV contrast in the late 1980s.   We discussed that the contrast used today is much better tolerated.   She should be able to tolerate a CT with contrast or cath if she needs.   I would give her pre medication ( steroids, benadryl, pepcid.    Metoprolol Rash and Other (See Comments)    Fatigue, soreactic rash    Family History:  Family History  Problem Relation Age of Onset   Cancer Mother        Colon cancer, breast cancer and lung cancer   Tremor Mother    Colon cancer Mother 69       died at 12   Lung cancer Mother        never smoked or around cig smoke   Arthritis Father    Vision loss Father        Macular degeneration   Scoliosis Father    Lymphoma Brother 27   Obesity Brother    Hashimoto's thyroiditis Daughter    Osteoporosis Maternal Grandmother    Esophageal cancer Neg Hx    Rectal cancer Neg Hx    Stomach cancer Neg Hx     Social History:  Social History   Tobacco Use   Smoking status: Never   Smokeless tobacco: Never  Vaping Use   Vaping status: Never Used  Substance Use Topics   Alcohol use: No   Drug use: No    ROS: Constitutional:  Negative for fever, chills, weight loss CV: Negative for chest pain, previous MI, hypertension Respiratory:  Negative for shortness of breath, wheezing, sleep apnea, frequent cough GI:  Negative for nausea, vomiting, bloody stool, GERD  Physical exam: BP 108/67   Pulse 70   Ht 5\' 3"  (1.6 m)   Wt 177 lb (80.3 kg)   BMI 31.35 kg/m  GENERAL APPEARANCE:  Well appearing, well developed, well nourished, NAD HEENT:  Atraumatic, normocephalic, oropharynx clear NECK:  Supple without lymphadenopathy or thyromegaly ABDOMEN:   Soft, non-tender, no masses EXTREMITIES:  Moves all extremities well, without clubbing, cyanosis, or edema NEUROLOGIC:  Alert and oriented x 3, normal gait, CN II-XII grossly intact MENTAL STATUS:  appropriate BACK:  Non-tender to palpation, No CVAT SKIN:  Warm, dry, and intact  Results: U/A:  0-5 WBC, 0-2 RBC (cath specimen)  CYSTOSCOPY  Procedure: Flexible cystoscopy  Pre-Operative Diagnosis:  Microscopic hematuria  Post-Operative Diagnosis: Microscopic hematuria  Anesthesia: local with lidocaine gel  Surgical Narrative:  After appropriate informed consent was  obtained, the patient was prepped and draped in the usual sterile fashion in the supine position. She was correctly identified and the proper procedure delineated prior to proceeding. Sterile lidocaine gel was instilled in the urethra.  The flexible cystoscope was introduced without difficulty.  Findings:  Urethra: Normal  Bladder:  squamous metaplasia; no papillary lesions  Ureteral orifices: normal with clear efflux bilaterally  Additional findings: none  A bladder wash was not obtained for cytology.  Pelvic exam was not positive for pelvic prolapse.  She tolerated the procedure well.  A chaperone was present throughout the procedure.

## 2023-09-02 NOTE — H&P (View-Only) (Signed)
Assessment: 1. Microscopic hematuria   2. Nephrolithiasis   3. History of UTI     Plan: I discussed the findings on cystoscopy with the patient today.  No evidence of urethral or bladder abnormalities. Cipro x 1 today following cystoscopy. I reviewed her CT from July 2024.  She has a 2 cm branching calculus in the right superior pole, six 6 mm lower pole left renal calculus and a 12 mm upper pole left renal calculus, all nonobstructing. Management options for the renal calculi discussed, including observation, shockwave lithotripsy, ureteroscopic laser lithotripsy, and percutaneous nephrolithotomy. I discussed the pros and cons of each treatment modality.  I advised her that given the stone size, I would recommend placement of a ureteral stent prior to shockwave lithotripsy to hopefully decrease the risk of obstructing fragments.  I also advised her that shockwave lithotripsy and ureteroscopic laser lithotripsy may require staged procedures due to the stone size.  I advised her that a percutaneous nephrolithotomy would offer her the best chance for successful removal of the stone in 1 treatment, although additional treatments may be necessary including sandwich therapy with shockwave lithotripsy.  I offered her a referral to 1 of my colleagues in [place] who performs percutaneous nephrolithotomy's on a regular basis.  She would like to consider her options at this time and will let me know her decision regarding moving forward with treatment. She will contact me with her decision.   Chief Complaint:  Chief Complaint  Patient presents with   Cysto    History of Present Illness:  Meredith Finley is a 62 y.o. female who is seen for further evaluation of microscopic hematuria and nephrolithiasis.   She has a history of nephrolithiasis undergoing shockwave lithotripsy and ureteroscopy in the 1990s.  She has not had any recent stone symptoms.  She recently underwent CT evaluation for microscopic  hematuria.  Dipstick urinalysis in the office showed trace blood.   CT imaging from 05/19/2023 showed nonobstructing renal calculi bilaterally with a branching stone in the superior pole of the right kidney measuring 22 mm, and 2 nonobstructing stones in the left measuring 12 and 6 mm in size.  She also had a right upper pole renal cyst measuring 13 mm. Urine culture from 9/24:  10-25K mixed flora She developed UTI symptoms and had a Resolve MDx urine culture sent on 08/11/23 which grew E. Coli and enterococcus. She was treated with Macrobid x 10 days.  Her symptoms resolved.  She presents today for cystoscopy for further evaluation of microscopic hematuria. No flank pain.  No gross hematuria or dysuria.  No current UTI symptoms.  Portions of the above documentation were copied from a prior visit for review purposes only.  Past Medical History:  Past Medical History:  Diagnosis Date   A-fib [facility])    Allergy    Anxiety    Asthma    Cataract    Diabetes mellitus without complication (HCC)    Gestational Diabetes   Endometriosis 1996   GERD (gastroesophageal reflux disease)    H/O osteopenia    HLD (hyperlipidemia)    Hx of varicella    NEPHROLITHIASIS 02/22/2009   Qualifier: Diagnosis of  By: Excell Seltzer CMA, Awilda Bill    Other specified congenital anomaly of kidney    Palpitations    Prediabetes    Sleep apnea    Transfusion history    placental abruption 1986   Tremor     Past Surgical History:  Past Surgical History:  Procedure Laterality Date   ABDOMINAL HYSTERECTOMY     ABDOMINAL SURGERY     for congenital kidney abnormality   CARPAL TUNNEL RELEASE     CESAREAN SECTION  [id number]   LAPAROSCOPIC SALPINGO OOPHERECTOMY     LITHOTRIPSY  1992-1996   multiple   NOSE SURGERY     surgical fusion disk 5& 6      Allergies:  Allergies  Allergen Reactions   Beta Adrenergic Blockers Other (See Comments)    Fatigue,, Rash   Niacin Nausea And Vomiting and Rash    Other  Reaction(s): GI Intolerance   Other Other (See Comments) and Rash    Fatigue, Rash    Penicillin G Rash    Other reaction(s): Unknown   Bystolic [Nebivolol Hcl]     Rash    Iohexol     Other Reaction(s): Not available   Ioxaglate    Ivp Dye [Iodinated Contrast Media] Hives    Had a reaction to IV contrast in the late 1980s.   We discussed that the contrast used today is much better tolerated.   She should be able to tolerate a CT with contrast or cath if she needs.   I would give her pre medication ( steroids, benadryl, pepcid.    Metoprolol Rash and Other (See Comments)    Fatigue, soreactic rash    Family History:  Family History  Problem Relation Age of Onset   Cancer Mother        Colon cancer, breast cancer and lung cancer   Tremor Mother    Colon cancer Mother 69       died at 12   Lung cancer Mother        never smoked or around cig smoke   Arthritis Father    Vision loss Father        Macular degeneration   Scoliosis Father    Lymphoma Brother 27   Obesity Brother    Hashimoto's thyroiditis Daughter    Osteoporosis Maternal Grandmother    Esophageal cancer Neg Hx    Rectal cancer Neg Hx    Stomach cancer Neg Hx     Social History:  Social History   Tobacco Use   Smoking status: Never   Smokeless tobacco: Never  Vaping Use   Vaping status: Never Used  Substance Use Topics   Alcohol use: No   Drug use: No    ROS: Constitutional:  Negative for fever, chills, weight loss CV: Negative for chest pain, previous MI, hypertension Respiratory:  Negative for shortness of breath, wheezing, sleep apnea, frequent cough GI:  Negative for nausea, vomiting, bloody stool, GERD  Physical exam: BP 108/67   Pulse 70   Ht 5\' 3"  (1.6 m)   Wt 177 lb (80.3 kg)   BMI 31.35 kg/m  GENERAL APPEARANCE:  Well appearing, well developed, well nourished, NAD HEENT:  Atraumatic, normocephalic, oropharynx clear NECK:  Supple without lymphadenopathy or thyromegaly ABDOMEN:   Soft, non-tender, no masses EXTREMITIES:  Moves all extremities well, without clubbing, cyanosis, or edema NEUROLOGIC:  Alert and oriented x 3, normal gait, CN II-XII grossly intact MENTAL STATUS:  appropriate BACK:  Non-tender to palpation, No CVAT SKIN:  Warm, dry, and intact  Results: U/A:  0-5 WBC, 0-2 RBC (cath specimen)  CYSTOSCOPY  Procedure: Flexible cystoscopy  Pre-Operative Diagnosis:  Microscopic hematuria  Post-Operative Diagnosis: Microscopic hematuria  Anesthesia: local with lidocaine gel  Surgical Narrative:  After appropriate informed consent was  obtained, the patient was prepped and draped in the usual sterile fashion in the supine position. She was correctly identified and the proper procedure delineated prior to proceeding. Sterile lidocaine gel was instilled in the urethra.  The flexible cystoscope was introduced without difficulty.  Findings:  Urethra: Normal  Bladder:  squamous metaplasia; no papillary lesions  Ureteral orifices: normal with clear efflux bilaterally  Additional findings: none  A bladder wash was not obtained for cytology.  Pelvic exam was not positive for pelvic prolapse.  She tolerated the procedure well.  A chaperone was present throughout the procedure.

## 2023-09-03 LAB — URINALYSIS, ROUTINE W REFLEX MICROSCOPIC
Bilirubin, UA: NEGATIVE
Glucose, UA: NEGATIVE
Ketones, UA: NEGATIVE
Nitrite, UA: NEGATIVE
Protein,UA: NEGATIVE
Specific Gravity, UA: 1.02 (ref 1.005–1.030)
Urobilinogen, Ur: 0.2 mg/dL (ref 0.2–1.0)
pH, UA: 5.5 (ref 5.0–7.5)

## 2023-09-03 LAB — MICROSCOPIC EXAMINATION: WBC, UA: >30 /[HPF] — AB (ref 0–5)

## 2023-09-03 LAB — URINALYSIS, MICROSCOPIC ONLY

## 2023-09-04 ENCOUNTER — Telehealth: Payer: Self-pay | Admitting: Urology

## 2023-09-04 NOTE — Telephone Encounter (Signed)
Patient chose a procedure option. Ureteroscopy

## 2023-09-08 ENCOUNTER — Ambulatory Visit: Payer: Self-pay | Admitting: Urology

## 2023-09-08 ENCOUNTER — Other Ambulatory Visit: Payer: Self-pay | Admitting: Urology

## 2023-09-08 DIAGNOSIS — N200 Calculus of kidney: Secondary | ICD-10-CM

## 2023-09-10 NOTE — Patient Instructions (Signed)
DUE TO COVID-19 ONLY TWO VISITORS  (aged 60 and older)  ARE ALLOWED TO COME WITH YOU AND STAY IN THE WAITING ROOM ONLY DURING PRE OP AND PROCEDURE.   **NO VISITORS ARE ALLOWED IN THE SHORT STAY AREA OR RECOVERY ROOM!!**  IF YOU WILL BE ADMITTED INTO THE HOSPITAL YOU ARE ALLOWED ONLY FOUR SUPPORT PEOPLE DURING VISITATION HOURS ONLY (7 AM -8PM)   The support person(s) must pass our screening, gel in and out, and wear a mask at all times, including in the patient's room. Patients must also wear a mask when staff or their support person are in the room. Visitors GUEST BADGE MUST BE WORN VISIBLY  One adult visitor may remain with you overnight and MUST be in the room by 8 P.M.     Your procedure is scheduled on: /5/24   Report to [facility] Main Entrance    Report to admitting at: 9:45 AM   Call this number if you have problems the morning of surgery [phone]   Do not eat food :After Midnight.   After Midnight you may have the following liquids until: 9:00 AM DAY OF SURGERY  Water Black Coffee (sugar ok, NO MILK/CREAM OR CREAMERS)  Tea (sugar ok, NO MILK/CREAM OR CREAMERS) regular and decaf                             Plain Jell-O (NO RED)                                           Fruit ices (not with fruit pulp, NO RED)                                     Popsicles (NO RED)                                                                  Juice: apple, WHITE grape, WHITE cranberry Sports drinks like Gatorade (NO RED)              FOLLOW ANY ADDITIONAL PRE OP INSTRUCTIONS YOU RECEIVED FROM YOUR SURGEON'S OFFICE!!!   Oral Hygiene is also important to reduce your risk of infection.                                    Remember - BRUSH YOUR TEETH THE MORNING OF SURGERY WITH YOUR REGULAR TOOTHPASTE  DENTURES WILL BE REMOVED PRIOR TO SURGERY PLEASE DO NOT APPLY "Poly grip" OR ADHESIVES!!!   Do NOT smoke after Midnight   Take these medicines the morning of surgery with A SIP  OF WATER: NONE. Use inhalers as usual.  DO NOT TAKE ANY ORAL DIABETIC MEDICATIONS DAY OF YOUR SURGERY                              You may not have any metal on your body including hair pins,  jewelry, and body piercing             Do not wear make-up, lotions, powders, perfumes/cologne, or deodorant  Do not wear nail polish including gel and S&S, artificial/acrylic nails, or any other type of covering on natural nails including finger and toenails. If you have artificial nails, gel coating, etc. that needs to be removed by a nail salon please have this removed prior to surgery or surgery may need to be canceled/ delayed if the surgeon/ anesthesia feels like they are unable to be safely monitored.   Do not shave  48 hours prior to surgery.    Do not bring valuables to the hospital. [facility] IS NOT             RESPONSIBLE   FOR VALUABLES.   Contacts, glasses, or bridgework may not be worn into surgery.   Bring small overnight bag day of surgery.   DO NOT BRING YOUR HOME MEDICATIONS TO THE HOSPITAL. PHARMACY WILL DISPENSE MEDICATIONS LISTED ON YOUR MEDICATION LIST TO YOU DURING YOUR ADMISSION IN THE HOSPITAL!    Patients discharged on the day of surgery will not be allowed to drive home.  Someone NEEDS to stay with you for the first 24 hours after anesthesia.   Special Instructions: Bring a copy of your healthcare power of attorney and living will documents         the day of surgery if you haven't scanned them before.              Please read over the following fact sheets you were given: IF YOU HAVE QUESTIONS ABOUT YOUR PRE-OP INSTRUCTIONS PLEASE CALL [phone]    [facility] - Preparing for Surgery Before surgery, you can play an important role.  Because skin is not sterile, your skin needs to be as free of germs as possible.  You can reduce the number of germs on your skin by washing with CHG (chlorahexidine gluconate) soap before surgery.  CHG is an antiseptic cleaner which kills  germs and bonds with the skin to continue killing germs even after washing. Please DO NOT use if you have an allergy to CHG or antibacterial soaps.  If your skin becomes reddened/irritated stop using the CHG and inform your nurse when you arrive at Short Stay. Do not shave (including legs and underarms) for at least 48 hours prior to the first CHG shower.  You may shave your face/neck. Please follow these instructions carefully:  1.  Shower with CHG Soap the night before surgery and the  morning of Surgery.  2.  If you choose to wash your hair, wash your hair first as usual with your  normal  shampoo.  3.  After you shampoo, rinse your hair and body thoroughly to remove the  shampoo.                           4.  Use CHG as you would any other liquid soap.  You can apply chg directly  to the skin and wash                       Gently with a scrungie or clean washcloth.  5.  Apply the CHG Soap to your body ONLY FROM THE NECK DOWN.   Do not use on face/ open  Wound or open sores. Avoid contact with eyes, ears mouth and genitals (private parts).                       Wash face,  Genitals (private parts) with your normal soap.             6.  Wash thoroughly, paying special attention to the area where your surgery  will be performed.  7.  Thoroughly rinse your body with warm water from the neck down.  8.  DO NOT shower/wash with your normal soap after using and rinsing off  the CHG Soap.                9.  Pat yourself dry with a clean towel.            10.  Wear clean pajamas.            11.  Place clean sheets on your bed the night of your first shower and do not  sleep with pets. Day of Surgery : Do not apply any lotions/deodorants the morning of surgery.  Please wear clean clothes to the hospital/surgery center.  FAILURE TO FOLLOW THESE INSTRUCTIONS MAY RESULT IN THE CANCELLATION OF YOUR SURGERY PATIENT SIGNATURE_________________________________  NURSE  SIGNATURE__________________________________  ________________________________________________________________________1

## 2023-09-11 ENCOUNTER — Encounter (HOSPITAL_COMMUNITY)
Admission: RE | Admit: 2023-09-11 | Discharge: 2023-09-11 | Disposition: A | Discharge status: Home / Self Care | Payer: Medicaid Other | Source: Ambulatory Visit | Attending: Urology | Admitting: Urology

## 2023-09-11 ENCOUNTER — Other Ambulatory Visit: Payer: Self-pay

## 2023-09-11 ENCOUNTER — Encounter (HOSPITAL_COMMUNITY): Payer: Self-pay

## 2023-09-11 VITALS — BP 103/68 | HR 71 | Temp 98.0°F | Ht 63.0 in | Wt 179.0 lb

## 2023-09-11 DIAGNOSIS — Z01818 Encounter for other preprocedural examination: Secondary | ICD-10-CM | POA: Insufficient documentation

## 2023-09-11 DIAGNOSIS — N200 Calculus of kidney: Secondary | ICD-10-CM | POA: Diagnosis not present

## 2023-09-11 DIAGNOSIS — I480 Paroxysmal atrial fibrillation: Secondary | ICD-10-CM | POA: Insufficient documentation

## 2023-09-11 DIAGNOSIS — I4891 Unspecified atrial fibrillation: Secondary | ICD-10-CM | POA: Diagnosis not present

## 2023-09-11 DIAGNOSIS — G4730 Sleep apnea, unspecified: Secondary | ICD-10-CM | POA: Insufficient documentation

## 2023-09-11 DIAGNOSIS — E119 Type 2 diabetes mellitus without complications: Secondary | ICD-10-CM | POA: Insufficient documentation

## 2023-09-11 HISTORY — DX: Personal history of urinary calculi: Z87.442

## 2023-09-11 HISTORY — DX: Cardiac arrhythmia, unspecified: I49.9

## 2023-09-11 LAB — BASIC METABOLIC PANEL
Anion gap: 9 (ref 5–15)
BUN: 14 mg/dL (ref 6–20)
CO2: 25 mmol/L (ref 22–32)
Calcium: 9.2 mg/dL (ref 8.9–10.3)
Chloride: 105 mmol/L (ref 98–111)
Creatinine, Ser: 0.63 mg/dL (ref 0.44–1.00)
GFR, Estimated: >60 mL/min (ref >60)
Glucose, Bld: 94 mg/dL (ref 70–99)
Potassium: 3.9 mmol/L (ref 3.5–5.1)
Sodium: 139 mmol/L (ref 135–145)

## 2023-09-11 LAB — CBC
HCT: 41.5 % (ref 36.0–46.0)
Hemoglobin: 13.5 g/dL (ref 12.0–15.0)
MCH: 30.9 pg (ref 26.0–34.0)
MCHC: 32.5 g/dL (ref 30.0–36.0)
MCV: 95 fL (ref 80.0–100.0)
Platelets: 310 10*3/uL (ref 150–400)
RBC: 4.37 MIL/uL (ref 3.87–5.11)
RDW: 12.5 % (ref 11.5–15.5)
WBC: 8.9 10*3/uL (ref 4.0–10.5)
nRBC: 0 % (ref 0.0–0.2)

## 2023-09-11 LAB — GLUCOSE, CAPILLARY: Glucose-Capillary: 95 mg/dL (ref 70–99)

## 2023-09-11 LAB — HEMOGLOBIN A1C
Hgb A1c MFr Bld: 5.4 % (ref 4.8–5.6)
Mean Plasma Glucose: 108.28 mg/dL

## 2023-09-11 NOTE — Progress Notes (Addendum)
For Anesthesia: PCP - Dr. Caffie Damme. Cardiologist - Dr. Katherina Right. LOV: 09/30/22  Bowel Prep reminder:  Chest x-ray -  EKG - 05/2023: requested Stress Test -  ECHO - 02/23/18: requested Cardiac Cath -  Pacemaker/ICD device last checked: Pacemaker orders received: Device Rep notified:  Spinal Cord Stimulator:  Sleep Study - Yes CPAP - Yes  Fasting Blood Sugar - N/A Checks Blood Sugar _____ times a day Date and result of last Hgb A1c-  Last dose of GLP1 agonist- N/A GLP1 instructions:   Last dose of SGLT-2 inhibitors- N/A SGLT-2 instructions:   Blood Thinner Instructions: N/A Aspirin Instructions: Last Dose:  Activity level: Can go up a flight of stairs and activities of daily living without stopping and without chest pain and/or shortness of breath   Able to exercise without chest pain and/or shortness of breath  Anesthesia review: Hx: Afib,OSA(CPAP),DIA  Patient denies shortness of breath, fever, cough and chest pain at PAT appointment   Patient verbalized understanding of instructions that were given to them at the PAT appointment. Patient was also instructed that they will need to review over the PAT instructions again at home before surgery.

## 2023-09-14 NOTE — Anesthesia Preprocedure Evaluation (Signed)
Anesthesia Evaluation  Patient identified by MRN, date of birth, ID band Patient awake    Reviewed: Allergy & Precautions, NPO status , Patient's Chart, lab work & pertinent test results  Airway Mallampati: III  TM Distance: >3 FB Neck ROM: Full    Dental  (+) Teeth Intact, Dental Advisory Given   Pulmonary asthma (rescue inhaler once a month) , sleep apnea and Continuous Positive Airway Pressure Ventilation    Pulmonary exam normal breath sounds clear to auscultation       Cardiovascular Normal cardiovascular exam+ dysrhythmias (no A/C) Atrial Fibrillation  Rhythm:Regular Rate:Normal     Neuro/Psych  PSYCHIATRIC DISORDERS Anxiety     negative neurological ROS     GI/Hepatic Neg liver ROS,GERD  Medicated and Controlled,,  Endo/Other  diabetes, Well Controlled, Type 2, Oral Hypoglycemic Agents  Obesity BMI 32  Renal/GU negative Renal ROS  negative genitourinary   Musculoskeletal negative musculoskeletal ROS (+)    Abdominal  (+) + obese  Peds  Hematology negative hematology ROS (+) Hb 13.5   Anesthesia Other Findings   Reproductive/Obstetrics negative OB ROS                             Anesthesia Physical Anesthesia Plan  ASA: 3  Anesthesia Plan: General   Post-op Pain Management: Tylenol PO (pre-op)*   Induction: Intravenous  PONV Risk Score and Plan: 4 or greater and Ondansetron, Dexamethasone, Midazolam and Treatment may vary due to age or medical condition  Airway Management Planned: LMA  Additional Equipment: None  Intra-op Plan:   Post-operative Plan: Extubation in OR  Informed Consent: I have reviewed the patients History and Physical, chart, labs and discussed the procedure including the risks, benefits and alternatives for the proposed anesthesia with the patient or authorized representative who has indicated his/her understanding and acceptance.     Dental advisory  given  Plan Discussed with: CRNA  Anesthesia Plan Comments:        Anesthesia Quick Evaluation

## 2023-09-14 NOTE — Progress Notes (Signed)
Anesthesia Chart Review   Case: [id number] Date/Time: 09/15/23 1145   Procedure: CYSTOSCOPY WITH RETROGRADE PYELOGRAM, URETEROSCOPY AND STENT PLACEMENT (Bilateral)   Anesthesia type: General   Pre-op diagnosis: right nephrolithiasis   Location: [facility] ROOM 03 / [facility]   Surgeons: Milderd Meager., MD       DISCUSSION:62 y.o. never smoker with h/o sleep apnea, atrial fibrillation, DM II, right nephrolithiasis scheduled for above procedure 09/15/2023 with Dr. [name].   Pt last seen by cardiology 09/30/22. Stable at this visit with 1 year follow up recommended.   Seen by pulmonology 05/05/2023. Stable at this visit.  VS: BP 103/68   Pulse 71   Temp 36.7 C (Oral)   Ht 5\' 3"  (1.6 m)   Wt 81.2 kg   SpO2 98%   BMI 31.71 kg/m   PROVIDERS: Panosh, Neta Mends, MD is PCP    LABS: Labs reviewed: Acceptable for surgery. (all labs ordered are listed, but only abnormal results are displayed)  Labs Reviewed  HEMOGLOBIN A1C  BASIC METABOLIC PANEL  CBC  GLUCOSE, CAPILLARY     IMAGES:   EKG:   CV: Myocardial Perfusion 03/31/2018 Nuclear stress EF: 73%. The left ventricular ejection fraction is hyperdynamic (>65%). No T wave inversion was noted during stress. There was no ST segment deviation noted during stress. This is a low risk study.   No reversible ischemia. LVEF 73% with normal wall motion. This is a low risk study.  Echo 02/23/2018 - Left ventricle: The cavity size was normal. Wall thickness was    increased in a pattern of mild LVH. Systolic function was normal.    The estimated ejection fraction was in the range of 60% to 65%.    Wall motion was normal; there were no regional wall motion    abnormalities. Left ventricular diastolic function parameters    were normal.  - Left atrium: The atrium was mildly dilated.  - Atrial septum: No defect or patent foramen ovale was identified.  Past Medical History:  Diagnosis Date   A-fib (HCC)    Allergy     Anxiety    Asthma    Cataract    Diabetes mellitus without complication (HCC)    Gestational Diabetes   Dysrhythmia    Endometriosis 1996   GERD (gastroesophageal reflux disease)    H/O osteopenia    History of kidney stones    HLD (hyperlipidemia)    Hx of varicella    Osteopenia    Palpitations    Prediabetes    Sleep apnea    Transfusion history    placental abruption 1986   Tremor     Past Surgical History:  Procedure Laterality Date   ABDOMINAL HYSTERECTOMY     ABDOMINAL SURGERY     for congenital kidney abnormality   CARPAL TUNNEL RELEASE     CESAREAN SECTION  [id number]   LAPAROSCOPIC SALPINGO OOPHERECTOMY     LITHOTRIPSY  1992-1996   multiple   NOSE SURGERY     surgical fusion disk 5& 6     TONSILLECTOMY      MEDICATIONS:  Cholecalciferol (VITAMIN D3) 25 MCG (1000 UT) CAPS   cyanocobalamin (VITAMIN B12) 1000 MCG tablet   ferrous sulfate 324 MG TBEC   levalbuterol (XOPENEX HFA) 45 MCG/ACT inhaler   MAGNESIUM PO   metFORMIN (GLUCOPHAGE-XR) 500 MG 24 hr tablet   Multiple Vitamin (MULTIVITAMIN) tablet   polyethylene glycol powder (GLYCOLAX/MIRALAX) 17 GM/SCOOP powder    0.9 %  sodium chloride  infusion      Jodell Cipro Ward, PA-C [facility] [phone]

## 2023-09-15 ENCOUNTER — Ambulatory Visit (HOSPITAL_COMMUNITY): Payer: Self-pay | Admitting: Physician Assistant

## 2023-09-15 ENCOUNTER — Ambulatory Visit (HOSPITAL_BASED_OUTPATIENT_CLINIC_OR_DEPARTMENT_OTHER): Payer: Self-pay | Admitting: Anesthesiology

## 2023-09-15 ENCOUNTER — Other Ambulatory Visit: Payer: Self-pay

## 2023-09-15 ENCOUNTER — Encounter (HOSPITAL_COMMUNITY): Payer: Self-pay | Admitting: Urology

## 2023-09-15 ENCOUNTER — Encounter (HOSPITAL_COMMUNITY)
Admission: RE | Disposition: A | Discharge status: Home / Self Care | Payer: Self-pay | Source: Home / Self Care | Attending: Urology

## 2023-09-15 ENCOUNTER — Ambulatory Visit (HOSPITAL_COMMUNITY): Payer: Medicaid Other

## 2023-09-15 ENCOUNTER — Ambulatory Visit (HOSPITAL_COMMUNITY)
Admission: RE | Admit: 2023-09-15 | Discharge: 2023-09-15 | Disposition: A | Discharge status: Home / Self Care | Payer: Medicaid Other | Attending: Urology | Admitting: Urology

## 2023-09-15 DIAGNOSIS — N202 Calculus of kidney with calculus of ureter: Secondary | ICD-10-CM | POA: Insufficient documentation

## 2023-09-15 DIAGNOSIS — I4891 Unspecified atrial fibrillation: Secondary | ICD-10-CM | POA: Diagnosis not present

## 2023-09-15 DIAGNOSIS — N200 Calculus of kidney: Secondary | ICD-10-CM | POA: Diagnosis not present

## 2023-09-15 HISTORY — PX: CYSTOSCOPY WITH RETROGRADE PYELOGRAM, URETEROSCOPY AND STENT PLACEMENT: SHX5789

## 2023-09-15 LAB — GLUCOSE, CAPILLARY
Glucose-Capillary: 90 mg/dL (ref 70–99)
Glucose-Capillary: 98 mg/dL (ref 70–99)

## 2023-09-15 SURGERY — CYSTOURETEROSCOPY, WITH RETROGRADE PYELOGRAM AND STENT INSERTION
Anesthesia: General | Laterality: Right

## 2023-09-15 MED ORDER — HYDROMORPHONE HCL 1 MG/ML IJ SOLN: 0.2500 mg | INTRAMUSCULAR | Status: DC | PRN

## 2023-09-15 MED ORDER — PHENYLEPHRINE 80 MCG/ML (10ML) SYRINGE FOR IV PUSH (FOR BLOOD PRESSURE SUPPORT)
PREFILLED_SYRINGE | INTRAVENOUS | Status: AC
  Filled 2023-09-15: qty 10

## 2023-09-15 MED ORDER — MIDAZOLAM HCL 5 MG/5ML IJ SOLN
INTRAMUSCULAR | Status: DC | PRN
  Administered 2023-09-15: 2 mg via INTRAVENOUS

## 2023-09-15 MED ORDER — ACETAMINOPHEN 500 MG PO TABS
1000.0000 mg | ORAL_TABLET | Freq: Once | ORAL | Status: AC
  Administered 2023-09-15: 1000 mg via ORAL
  Filled 2023-09-15: qty 2

## 2023-09-15 MED ORDER — FENTANYL CITRATE (PF) 100 MCG/2ML IJ SOLN
INTRAMUSCULAR | Status: DC | PRN
  Administered 2023-09-15 (×2): 50 ug via INTRAVENOUS

## 2023-09-15 MED ORDER — OXYCODONE HCL 5 MG PO TABS
5.0000 mg | ORAL_TABLET | Freq: Once | ORAL | Status: DC | PRN
Start: 2023-09-15 — End: 2023-09-15

## 2023-09-15 MED ORDER — CIPROFLOXACIN IN D5W 400 MG/200ML IV SOLN
400.0000 mg | INTRAVENOUS | Status: AC
  Administered 2023-09-15: 400 mg via INTRAVENOUS
  Filled 2023-09-15: qty 200

## 2023-09-15 MED ORDER — NITROFURANTOIN MONOHYD MACRO 100 MG PO CAPS: 100.0000 mg | ORAL_CAPSULE | Freq: Every day | ORAL | 0 refills | Status: AC

## 2023-09-15 MED ORDER — PROPOFOL 10 MG/ML IV BOLUS
INTRAVENOUS | Status: AC
  Filled 2023-09-15: qty 20

## 2023-09-15 MED ORDER — PHENYLEPHRINE HCL (PRESSORS) 10 MG/ML IV SOLN
INTRAVENOUS | Status: DC | PRN
  Administered 2023-09-15: 180 ug via INTRAVENOUS

## 2023-09-15 MED ORDER — LIDOCAINE HCL (CARDIAC) PF 100 MG/5ML IV SOSY
PREFILLED_SYRINGE | INTRAVENOUS | Status: DC | PRN
  Administered 2023-09-15: 60 mg via INTRAVENOUS

## 2023-09-15 MED ORDER — FENTANYL CITRATE (PF) 100 MCG/2ML IJ SOLN
INTRAMUSCULAR | Status: AC
  Filled 2023-09-15: qty 2

## 2023-09-15 MED ORDER — LACTATED RINGERS IV SOLN: INTRAVENOUS | Status: DC

## 2023-09-15 MED ORDER — AMISULPRIDE (ANTIEMETIC) 5 MG/2ML IV SOLN: 10.0000 mg | Freq: Once | INTRAVENOUS | Status: DC | PRN

## 2023-09-15 MED ORDER — DEXAMETHASONE SODIUM PHOSPHATE 10 MG/ML IJ SOLN
INTRAMUSCULAR | Status: AC
  Filled 2023-09-15: qty 1

## 2023-09-15 MED ORDER — LIDOCAINE HCL (PF) 2 % IJ SOLN
INTRAMUSCULAR | Status: AC
  Filled 2023-09-15: qty 5

## 2023-09-15 MED ORDER — KETOROLAC TROMETHAMINE 30 MG/ML IJ SOLN
INTRAMUSCULAR | Status: DC | PRN
  Administered 2023-09-15: 30 mg via INTRAVENOUS

## 2023-09-15 MED ORDER — LIDOCAINE HCL URETHRAL/MUCOSAL 2 % EX GEL
CUTANEOUS | Status: DC | PRN
  Administered 2023-09-15: 1

## 2023-09-15 MED ORDER — HYDROCODONE-ACETAMINOPHEN 5-325 MG PO TABS: 1.0000 | ORAL_TABLET | ORAL | 0 refills | Status: DC | PRN

## 2023-09-15 MED ORDER — ORAL CARE MOUTH RINSE: 15.0000 mL | Freq: Once | OROMUCOSAL | Status: AC

## 2023-09-15 MED ORDER — PHENAZOPYRIDINE HCL 200 MG PO TABS
200.0000 mg | ORAL_TABLET | Freq: Three times a day (TID) | ORAL | 1 refills | Status: DC | PRN
Start: 2023-09-15 — End: 2023-10-16

## 2023-09-15 MED ORDER — INSULIN ASPART 100 UNIT/ML IJ SOLN: 0.0000 [IU] | INTRAMUSCULAR | Status: DC | PRN

## 2023-09-15 MED ORDER — ONDANSETRON HCL 4 MG/2ML IJ SOLN
INTRAMUSCULAR | Status: AC
Start: 2023-09-15 — End: ?
  Filled 2023-09-15: qty 2

## 2023-09-15 MED ORDER — PROPOFOL 10 MG/ML IV BOLUS
INTRAVENOUS | Status: DC | PRN
  Administered 2023-09-15: 200 mg via INTRAVENOUS

## 2023-09-15 MED ORDER — ONDANSETRON HCL 4 MG/2ML IJ SOLN
INTRAMUSCULAR | Status: AC
  Filled 2023-09-15: qty 2

## 2023-09-15 MED ORDER — CHLORHEXIDINE GLUCONATE 0.12 % MT SOLN
15.0000 mL | Freq: Once | OROMUCOSAL | Status: AC
  Administered 2023-09-15: 15 mL via OROMUCOSAL

## 2023-09-15 MED ORDER — SODIUM CHLORIDE 0.9 % IR SOLN
Status: DC | PRN
  Administered 2023-09-15: 3000 mL

## 2023-09-15 MED ORDER — DEXAMETHASONE SODIUM PHOSPHATE 4 MG/ML IJ SOLN
INTRAMUSCULAR | Status: DC | PRN
  Administered 2023-09-15: 10 mg via INTRAVENOUS

## 2023-09-15 MED ORDER — OXYCODONE HCL 5 MG/5ML PO SOLN: 5.0000 mg | Freq: Once | ORAL | Status: DC | PRN

## 2023-09-15 MED ORDER — LIDOCAINE HCL URETHRAL/MUCOSAL 2 % EX GEL
CUTANEOUS | Status: AC
  Filled 2023-09-15: qty 30

## 2023-09-15 MED ORDER — ONDANSETRON HCL 4 MG/2ML IJ SOLN
INTRAMUSCULAR | Status: DC | PRN
  Administered 2023-09-15: 4 mg via INTRAVENOUS

## 2023-09-15 MED ORDER — ONDANSETRON HCL 4 MG/2ML IJ SOLN
4.0000 mg | Freq: Once | INTRAMUSCULAR | Status: AC | PRN
  Administered 2023-09-15: 4 mg via INTRAVENOUS

## 2023-09-15 MED ORDER — MIDAZOLAM HCL 2 MG/2ML IJ SOLN
INTRAMUSCULAR | Status: AC
  Filled 2023-09-15: qty 2

## 2023-09-15 MED ORDER — IOHEXOL 300 MG/ML  SOLN
INTRAMUSCULAR | Status: DC | PRN
  Administered 2023-09-15: 19 mL

## 2023-09-15 MED ORDER — TAMSULOSIN HCL 0.4 MG PO CAPS: 0.4000 mg | ORAL_CAPSULE | Freq: Every day | ORAL | 1 refills | Status: DC

## 2023-09-15 SURGICAL SUPPLY — 20 items
BAG URO CATCHER STRL LF (MISCELLANEOUS) ×1 IMPLANT
BASKET ZERO TIP NITINOL 2.4FR (BASKET) IMPLANT
BSKT STON RTRVL ZERO TP 2.4FR (BASKET)
CATH URETL OPEN 5X70 (CATHETERS) IMPLANT
CLOTH BEACON ORANGE TIMEOUT ST (SAFETY) ×1 IMPLANT
GLOVE BIO SURGEON STRL SZ8 (GLOVE) ×1 IMPLANT
GOWN SPEC L4 XLG W/TWL (GOWN DISPOSABLE) ×1 IMPLANT
GUIDEWIRE STR DUAL SENSOR (WIRE) ×1 IMPLANT
GUIDEWIRE ZIPWRE .038 STRAIGHT (WIRE) IMPLANT
KIT TURNOVER KIT A (KITS) IMPLANT
LASER FIB FLEXIVA PULSE ID 365 (Laser) IMPLANT
MANIFOLD NEPTUNE II (INSTRUMENTS) ×1 IMPLANT
PACK CYSTO (CUSTOM PROCEDURE TRAY) ×1 IMPLANT
SHEATH NAVIGATOR HD 12/14X36 (SHEATH) IMPLANT
SHEATH URETERAL FLEX 12X35 (SHEATH) IMPLANT
STENT URET 6FRX24 CONTOUR (STENTS) IMPLANT
TRACTIP FLEXIVA PULS ID 200XHI (Laser) IMPLANT
TRACTIP FLEXIVA PULSE ID 200 (Laser) ×1
TUBING CONNECTING 10 (TUBING) ×1 IMPLANT
TUBING UROLOGY SET (TUBING) ×1 IMPLANT

## 2023-09-15 NOTE — Transfer of Care (Signed)
Immediate Anesthesia Transfer of Care Note  Patient: Meredith Finley  Procedure(s) Performed: CYSTOSCOPY WITH BILATERAL RETROGRADE PYELOGRAM,RIGHT  URETEROSCOPY,LASER LITHOTRIPSY AND STENT PLACEMENT (Right)  Patient Location: PACU  Anesthesia Type:General  Level of Consciousness: awake, drowsy, and patient cooperative  Airway & Oxygen Therapy: Patient Spontanous Breathing and Patient connected to face mask oxygen  Post-op Assessment: Report given to RN and Post -op Vital signs reviewed and stable  Post vital signs: Reviewed and stable  Last Vitals:  Vitals Value Taken Time  BP 113/78 09/15/23 1350  Temp 36.2 C 09/15/23 1350  Pulse 66 09/15/23 1352  Resp 15 09/15/23 1352  SpO2 100 % 09/15/23 1352  Vitals shown include unfiled device data.  Last Pain:  Vitals:   09/15/23 1021  TempSrc: Oral         Complications: No notable events documented.

## 2023-09-15 NOTE — Op Note (Signed)
OPERATIVE NOTE   Patient Name: Meredith Finley  MRN: [id number]   Date of Procedure: 09/15/23  Preoperative diagnosis:  Right renal calculus (2 cm)  Postoperative diagnosis:  Right renal calculus (2 cm)  Procedure:  Cystoscopy Bilateral retrograde pyelograms with intra-operative interpretation Right ureteroscopy with laser lithotripsy Insertion of right ureteral calculus (5F x 24 cm, no tether)  Attending: Milderd Meager, MD  Anesthesia: General  Estimated blood loss: 0 ml  Fluids: Per anesthesia record  Drains: 5F x 24 cm ureteral stent on right, no tether  Specimens: none  Antibiotics: Cipro 400 mg IV  Findings: Normal urethra and bladder without mucosal lesions; normal trigone with a single orifice bilaterally; 2 cm calculus in upper pole calyx on right: normal ureters and collecting systems bilaterally  Indications:  78 YOF with right nephrolithiasis presents for surgical management.  She has a history of nephrolithiasis and has had ESL and ureteroscopy previously.  CT from July 2024 showed a branching stone in the upper pole calyx on right and bilateral renal calculi.  She was evaluated with cystoscopy for microscopic hematuria on 09/02/23.  No urethral or bladder abnormalities were seen.  Management options for the renal calculi discussed, including observation, shockwave lithotripsy, ureteroscopic laser lithotripsy, and percutaneous nephrolithotomy.  I discussed the pros and cons of each treatment modality.  I advised her that shockwave lithotripsy and ureteroscopic laser lithotripsy may require staged procedures due to the stone size.  I advised her that a percutaneous nephrolithotomy would offer her the best chance for successful removal of the stone in 1 treatment, although additional treatments may be necessary including sandwich therapy with shockwave lithotripsy.  She has elected to proceed with ureteroscopic laser lithotripsy.   Risks, benefits, alternatives were  discussed with the patient in detail.  Potential risks including, but not limited to, infection; bleeding;  injury to urethra, bladder, or ureter; possible need of other treatments; possible failure to remove the calculus; ureteral  stricture formation; cardiac, pulmonary, cerebrovascular events; and anesthetic complications were discussed.  The patient understands and wishes to proceed.   Description of Procedure:  The patient received IV Cipro preoperatively.  She was taken to the operating room suite and properly identified.  After successful induction of a general anesthetic, the patient was placed in the dorsolithotomy position.  The patient's genital area was prepped and draped in sterile fashion.  A timeout was performed.  Cystoscopy was performed using a 21 French rigid cystoscope.  The urethra and bladder were inspected and noted to be free of any abnormalities.  No mucosal lesions were seen.  A single orifice was noted bilaterally.  Bilateral retrograde pyelograms were performed for evaluation of the patient's upper tracts given her history of microscopic hematuria and nephrolithiasis.  Scout film showed a nonspecific bowel gas pattern and no obvious bony abnormalities.  Calcifications were seen over both renal shadows with a prominent 2 cm branching calcification noted over the upper portion of the right renal shadow.  Using a 5 [place] open-ended catheter, contrast was injected into the left ureter.  No filling defects or obstruction was seen on the left.  A normal ureter and collecting system were noted.  In a similar fashion, contrast was injected into the right ureter.  No filling defects were noted in the right ureter.  The previously noted branching calcification was confirmed to be in an upper pole calyx on the right.  A sensor guidewire was passed through the open-ended catheter and into the right renal pelvis  under fluoroscopic guidance.  A 12/64F flexor parallel access sheath was then  passed over the guidewire and into the proximal right ureter, leaving the guidewire in place.  I was unable to easily pass the access sheath all the way up to the renal pelvis.  Flexible ureteroscopy was then performed through the access sheath and alongside the guidewire.  The calculus in the upper pole calyx was identified easily.  Using the 242 m holmium laser fiber, the calculus was fragmented into multiple small fragments.  Both fragmentation and dusting settings were used.  The following extensive fragmentation, it appeared that all fragments were small enough to pass.  There was no evidence of any bleeding or injury to the calyces or renal pelvis.  The access sheath was removed along with the ureteroscope allowing inspection of the entire ureter.  There was no evidence of any ureteral trauma.  The guidewire was left in place.  A 6 French by 24 cm double-J stent was then passed over the guidewire and into the right renal pelvis.  The tether was removed prior to stent placement.  Stent position was confirmed by fluoroscopy.  The bladder was then drained and the cystoscope was removed.  Intraurethral lidocaine jelly was placed.  The patient was then extubated and taken to the post anesthesia care unit in stable condition.  Complications: None  Condition: Stable, extubated, transferred to PACU  Plan:  Discharge to home today. Continue stent for 2 weeks to allow passive dilation of the ureter and assist with passage of the numerous stone fragments after stent removal.

## 2023-09-15 NOTE — Anesthesia Postprocedure Evaluation (Signed)
Anesthesia Post Note  Patient: LAYLANIE KRUCZEK  Procedure(s) Performed: CYSTOSCOPY WITH BILATERAL RETROGRADE PYELOGRAM,RIGHT  URETEROSCOPY,LASER LITHOTRIPSY AND STENT PLACEMENT (Right)     Patient location during evaluation: PACU Anesthesia Type: General Level of consciousness: awake and alert, oriented and patient cooperative Pain management: pain level controlled Vital Signs Assessment: post-procedure vital signs reviewed and stable Respiratory status: spontaneous breathing, nonlabored ventilation and respiratory function stable Cardiovascular status: blood pressure returned to baseline and stable Postop Assessment: no apparent nausea or vomiting Anesthetic complications: no   No notable events documented.  Last Vitals:  Vitals:   09/15/23 1400 09/15/23 1415  BP: 119/74 123/69  Pulse: 78 65  Resp: 11 (!) 9  Temp:    SpO2: 100% 100%    Last Pain:  Vitals:   09/15/23 1415  TempSrc:   PainSc: 0-No pain                 Lannie Fields

## 2023-09-15 NOTE — Interval H&P Note (Signed)
History and Physical Interval Note:  09/15/2023 11:39 AM  Meredith Finley  has presented today for surgery, with the diagnosis of right nephrolithiasis.  The various methods of treatment have been discussed with the patient and family. After consideration of risks, benefits and other options for treatment, the patient has consented to  Procedure(s): CYSTOSCOPY WITH RETROGRADE PYELOGRAM, URETEROSCOPY AND STENT PLACEMENT (Bilateral) as a surgical intervention.  The patient's history has been reviewed, patient examined, no change in status, stable for surgery.  I have reviewed the patient's chart and labs.  Questions were answered to the patient's satisfaction.     [name]

## 2023-09-15 NOTE — Anesthesia Procedure Notes (Signed)
Procedure Name: LMA Insertion Date/Time: 09/15/2023 12:31 PM  Performed by: Chinita Pester, CRNAPre-anesthesia Checklist: Patient identified, Emergency Drugs available, Suction available and Patient being monitored Patient Re-evaluated:Patient Re-evaluated prior to induction Oxygen Delivery Method: Circle System Utilized Preoxygenation: Pre-oxygenation with 100% oxygen Induction Type: IV induction Ventilation: Mask ventilation without difficulty LMA: LMA inserted LMA Size: 4.0 Number of attempts: 1 Airway Equipment and Method: Bite block Placement Confirmation: positive ETCO2 Tube secured with: Tape Dental Injury: Teeth and Oropharynx as per pre-operative assessment

## 2023-09-16 ENCOUNTER — Other Ambulatory Visit: Payer: Self-pay

## 2023-09-16 ENCOUNTER — Encounter (HOSPITAL_COMMUNITY): Payer: Self-pay | Admitting: Urology

## 2023-09-16 DIAGNOSIS — N200 Calculus of kidney: Secondary | ICD-10-CM

## 2023-09-23 ENCOUNTER — Other Ambulatory Visit: Payer: Self-pay | Admitting: Urology

## 2023-09-23 ENCOUNTER — Telehealth: Payer: Self-pay | Admitting: Urology

## 2023-09-23 MED ORDER — OXYBUTYNIN CHLORIDE 5 MG PO TABS: 5.0000 mg | ORAL_TABLET | Freq: Three times a day (TID) | ORAL | 0 refills | Status: DC | PRN

## 2023-09-23 MED ORDER — FLUCONAZOLE 100 MG PO TABS: 100.0000 mg | ORAL_TABLET | Freq: Every day | ORAL | 0 refills | Status: AC

## 2023-09-23 NOTE — Telephone Encounter (Signed)
ureteroscopy 8 days ago and pt is still having trouble and wants to make sure things are ok. Patient has pain in her bladder area. pt is still bleeding. Wanted to let us know what is going on

## 2023-09-25 ENCOUNTER — Other Ambulatory Visit: Payer: Self-pay | Admitting: Internal Medicine

## 2023-09-28 ENCOUNTER — Ambulatory Visit (INDEPENDENT_AMBULATORY_CARE_PROVIDER_SITE_OTHER): Payer: Medicaid Other | Admitting: Urology

## 2023-09-28 ENCOUNTER — Ambulatory Visit (HOSPITAL_BASED_OUTPATIENT_CLINIC_OR_DEPARTMENT_OTHER)
Admission: RE | Admit: 2023-09-28 | Discharge: 2023-09-28 | Disposition: A | Discharge status: Home / Self Care | Payer: Medicaid Other | Source: Ambulatory Visit | Attending: Urology | Admitting: Urology

## 2023-09-28 ENCOUNTER — Encounter: Payer: Self-pay | Admitting: Urology

## 2023-09-28 VITALS — BP 112/71 | HR 71

## 2023-09-28 DIAGNOSIS — Z87440 Personal history of urinary (tract) infections: Secondary | ICD-10-CM

## 2023-09-28 DIAGNOSIS — N200 Calculus of kidney: Secondary | ICD-10-CM | POA: Insufficient documentation

## 2023-09-28 DIAGNOSIS — Z466 Encounter for fitting and adjustment of urinary device: Secondary | ICD-10-CM

## 2023-09-28 DIAGNOSIS — R3129 Other microscopic hematuria: Secondary | ICD-10-CM | POA: Diagnosis not present

## 2023-09-28 DIAGNOSIS — Z87442 Personal history of urinary calculi: Secondary | ICD-10-CM | POA: Diagnosis not present

## 2023-09-28 NOTE — Progress Notes (Signed)
Assessment: 1. Nephrolithiasis   2. Microscopic hematuria   3. History of UTI     Plan: I personally reviewed the KUB study from today.  The right ureteral stent is in good position.  There are a number of small calcifications along the proximal aspect of the stent consistent with stone fragments. Right ureteral stent removed today. Complete antibiotics Strain urine Return to office in 2 weeks with KUB.  Chief Complaint:  Chief Complaint  Patient presents with   Cysto Stent Removal    History of Present Illness:  Meredith Finley is a 62 y.o. female who is seen for further evaluation of microscopic hematuria and nephrolithiasis.   She has a history of nephrolithiasis undergoing shockwave lithotripsy and ureteroscopy in the 1990s.  She has not had any recent stone symptoms.  She recently underwent CT evaluation for microscopic hematuria.  Dipstick urinalysis in the office showed trace blood.   CT imaging from 05/19/2023 showed nonobstructing renal calculi bilaterally with a branching stone in the superior pole of the right kidney measuring 22 mm, and 2 nonobstructing stones in the left measuring 12 and 6 mm in size.  She also had a right upper pole renal cyst measuring 13 mm. Urine culture from 9/24:  10-25K mixed flora She developed UTI symptoms and had a Resolve MDx urine culture sent on 08/11/23 which grew E. Coli and enterococcus. She was treated with Macrobid x 10 days.  Her symptoms resolved. She underwent surgical management of the right renal calculus on 09/15/2023 with right ureteroscopic laser lithotripsy and insertion of a right ureteral stent.  The right renal calculus was fragmented with the laser.  She presents today for cystoscopy and stent removal.  She is having some stent related symptoms including frequency, urgency, and gross hematuria.  She continues on daily Macrobid.  No fevers or chills.  Portions of the above documentation were copied from a prior visit for review  purposes only.  Past Medical History:  Past Medical History:  Diagnosis Date   A-fib (HCC)    Allergy    Anxiety    Asthma    Cataract    Diabetes mellitus without complication (HCC)    Gestational Diabetes   Dysrhythmia    Endometriosis 1996   GERD (gastroesophageal reflux disease)    H/O osteopenia    History of kidney stones    HLD (hyperlipidemia)    Hx of varicella    Osteopenia    Palpitations    Prediabetes    Sleep apnea    Transfusion history    placental abruption 1986   Tremor     Past Surgical History:  Past Surgical History:  Procedure Laterality Date   ABDOMINAL HYSTERECTOMY     ABDOMINAL SURGERY     for congenital kidney abnormality   CARPAL TUNNEL RELEASE     CESAREAN SECTION  [phone]   CYSTOSCOPY WITH RETROGRADE PYELOGRAM, URETEROSCOPY AND STENT PLACEMENT Right 09/15/2023   Procedure: CYSTOSCOPY WITH BILATERAL RETROGRADE PYELOGRAM,RIGHT  URETEROSCOPY,LASER LITHOTRIPSY AND STENT PLACEMENT;  Surgeon: Milderd Meager., MD;  Location: [facility];  Service: Urology;  Laterality: Right;   LAPAROSCOPIC SALPINGO OOPHERECTOMY     LITHOTRIPSY  1992-1996   multiple   NOSE SURGERY     surgical fusion disk 5& 6     TONSILLECTOMY      Allergies:  Allergies  Allergen Reactions   Beta Adrenergic Blockers Other (See Comments)    Fatigue,, Rash   Niacin Nausea And Vomiting and Rash  Other Reaction(s): GI Intolerance   Other Other (See Comments) and Rash    Fatigue, Rash    Penicillin G Rash    Other reaction(s): Unknown   Bystolic [Nebivolol Hcl]     Rash    Iohexol     Other Reaction(s): Not available   Ioxaglate    Ivp Dye [Iodinated Contrast Media] Hives    Had a reaction to IV contrast in the late 1980s.   We discussed that the contrast used today is much better tolerated.   She should be able to tolerate a CT with contrast or cath if she needs.   I would give her pre medication ( steroids, benadryl, pepcid.    Metoprolol Rash and Other (See  Comments)    Fatigue, soreactic rash    Family History:  Family History  Problem Relation Age of Onset   Cancer Mother        Colon cancer, breast cancer and lung cancer   Tremor Mother    Colon cancer Mother 33       died at 79   Lung cancer Mother        never smoked or around cig smoke   Arthritis Father    Vision loss Father        Macular degeneration   Scoliosis Father    Lymphoma Brother 49   Obesity Brother    Hashimoto's thyroiditis Daughter    Osteoporosis Maternal Grandmother    Esophageal cancer Neg Hx    Rectal cancer Neg Hx    Stomach cancer Neg Hx     Social History:  Social History   Tobacco Use   Smoking status: Never   Smokeless tobacco: Never  Vaping Use   Vaping status: Never Used  Substance Use Topics   Alcohol use: No   Drug use: No    ROS: Constitutional:  Negative for fever, chills, weight loss CV: Negative for chest pain, previous MI, hypertension Respiratory:  Negative for shortness of breath, wheezing, sleep apnea, frequent cough GI:  Negative for nausea, vomiting, bloody stool, GERD  Physical exam: BP 112/71   Pulse 71  GENERAL APPEARANCE:  Well appearing, well developed, well nourished, NAD HEENT:  Atraumatic, normocephalic, oropharynx clear NECK:  Supple without lymphadenopathy or thyromegaly ABDOMEN:  Soft, non-tender, no masses EXTREMITIES:  Moves all extremities well, without clubbing, cyanosis, or edema NEUROLOGIC:  Alert and oriented x 3, normal gait, CN II-XII grossly intact MENTAL STATUS:  appropriate BACK:  Non-tender to palpation, No CVAT SKIN:  Warm, dry, and intact  Results: U/A:  6-10 WBC, >30 RBC, mod bacteria   Procedure: Flexible cystoscopy/stent removal  Pre-Operative Diagnosis:   nephrolithiasis  Post-Operative Diagnosis:  nephrolithiasis  Anesthesia: local with lidocaine gel  Surgical Narrative:  After appropriate informed consent was obtained, the patient was prepped and draped in the usual  sterile fashion in the supine position. She was correctly identified and the proper procedure delineated prior to proceeding. Sterile lidocaine gel was instilled in the urethra.  The flexible cystoscope was introduced without difficulty.  The right ureteral stent was identified and removed using stent graspers.  She tolerated the procedure well.  A chaperone was present throughout the procedure.

## 2023-09-29 ENCOUNTER — Telehealth: Payer: Self-pay | Admitting: Urology

## 2023-09-29 ENCOUNTER — Emergency Department (HOSPITAL_BASED_OUTPATIENT_CLINIC_OR_DEPARTMENT_OTHER): Payer: Medicaid Other

## 2023-09-29 ENCOUNTER — Other Ambulatory Visit: Payer: Self-pay

## 2023-09-29 ENCOUNTER — Emergency Department (HOSPITAL_BASED_OUTPATIENT_CLINIC_OR_DEPARTMENT_OTHER)
Admission: EM | Admit: 2023-09-29 | Discharge: 2023-09-29 | Disposition: A | Discharge status: Home / Self Care | Payer: Medicaid Other | Attending: Emergency Medicine | Admitting: Emergency Medicine

## 2023-09-29 ENCOUNTER — Encounter (HOSPITAL_BASED_OUTPATIENT_CLINIC_OR_DEPARTMENT_OTHER): Payer: Self-pay | Admitting: Emergency Medicine

## 2023-09-29 DIAGNOSIS — N200 Calculus of kidney: Secondary | ICD-10-CM | POA: Insufficient documentation

## 2023-09-29 DIAGNOSIS — Z7984 Long term (current) use of oral hypoglycemic drugs: Secondary | ICD-10-CM | POA: Insufficient documentation

## 2023-09-29 DIAGNOSIS — G8918 Other acute postprocedural pain: Secondary | ICD-10-CM | POA: Diagnosis present

## 2023-09-29 LAB — CBC WITH DIFFERENTIAL/PLATELET
Abs Immature Granulocytes: 0.08 10*3/uL — ABNORMAL HIGH (ref 0.00–0.07)
Basophils Absolute: 0.1 10*3/uL (ref 0.0–0.1)
Basophils Relative: 0 %
Eosinophils Absolute: 0.1 10*3/uL (ref 0.0–0.5)
Eosinophils Relative: 0 %
HCT: 42.6 % (ref 36.0–46.0)
Hemoglobin: 14.3 g/dL (ref 12.0–15.0)
Immature Granulocytes: 0 %
Lymphocytes Relative: 10 %
Lymphs Abs: 1.9 10*3/uL (ref 0.7–4.0)
MCH: 30.5 pg (ref 26.0–34.0)
MCHC: 33.6 g/dL (ref 30.0–36.0)
MCV: 90.8 fL (ref 80.0–100.0)
Monocytes Absolute: 0.4 10*3/uL (ref 0.1–1.0)
Monocytes Relative: 2 %
Neutro Abs: 17 10*3/uL — ABNORMAL HIGH (ref 1.7–7.7)
Neutrophils Relative %: 88 %
Platelets: 328 10*3/uL (ref 150–400)
RBC: 4.69 MIL/uL (ref 3.87–5.11)
RDW: 12.3 % (ref 11.5–15.5)
WBC: 19.5 10*3/uL — ABNORMAL HIGH (ref 4.0–10.5)
nRBC: 0 % (ref 0.0–0.2)

## 2023-09-29 LAB — COMPREHENSIVE METABOLIC PANEL
ALT: 43 U/L (ref 0–44)
AST: 30 U/L (ref 15–41)
Albumin: 4.2 g/dL (ref 3.5–5.0)
Alkaline Phosphatase: 101 U/L (ref 38–126)
Anion gap: 12 (ref 5–15)
BUN: 16 mg/dL (ref 6–20)
CO2: 23 mmol/L (ref 22–32)
Calcium: 9.7 mg/dL (ref 8.9–10.3)
Chloride: 103 mmol/L (ref 98–111)
Creatinine, Ser: 0.89 mg/dL (ref 0.44–1.00)
GFR, Estimated: >60 mL/min (ref >60)
Glucose, Bld: 146 mg/dL — ABNORMAL HIGH (ref 70–99)
Potassium: 4.1 mmol/L (ref 3.5–5.1)
Sodium: 138 mmol/L (ref 135–145)
Total Bilirubin: 1.3 mg/dL — ABNORMAL HIGH (ref <1.2)
Total Protein: 8.2 g/dL — ABNORMAL HIGH (ref 6.5–8.1)

## 2023-09-29 LAB — URINALYSIS, ROUTINE W REFLEX MICROSCOPIC
Bilirubin Urine: NEGATIVE
Glucose, UA: NEGATIVE mg/dL
Ketones, ur: 40 mg/dL — AB
Leukocytes,Ua: NEGATIVE
Nitrite: NEGATIVE
Protein, ur: 30 mg/dL — AB
Specific Gravity, Urine: 1.015 (ref 1.005–1.030)
pH: >=9 (ref 5.0–8.0)

## 2023-09-29 LAB — URINALYSIS, MICROSCOPIC (REFLEX)

## 2023-09-29 MED ORDER — CEPHALEXIN 500 MG PO CAPS: 500.0000 mg | ORAL_CAPSULE | Freq: Three times a day (TID) | ORAL | 0 refills | Status: AC

## 2023-09-29 MED ORDER — KETOROLAC TROMETHAMINE 15 MG/ML IJ SOLN
15.0000 mg | Freq: Once | INTRAMUSCULAR | Status: AC
  Administered 2023-09-29: 15 mg via INTRAVENOUS
  Filled 2023-09-29: qty 1

## 2023-09-29 MED ORDER — HYDROCODONE-ACETAMINOPHEN 5-325 MG PO TABS: 1.0000 | ORAL_TABLET | ORAL | 0 refills | Status: DC | PRN

## 2023-09-29 MED ORDER — SODIUM CHLORIDE 0.9 % IV SOLN
1.0000 g | Freq: Once | INTRAVENOUS | Status: AC
  Administered 2023-09-29: 1 g via INTRAVENOUS
  Filled 2023-09-29: qty 10

## 2023-09-29 MED ORDER — HYDROMORPHONE HCL 1 MG/ML IJ SOLN
1.0000 mg | Freq: Once | INTRAMUSCULAR | Status: AC
  Administered 2023-09-29: 1 mg via INTRAVENOUS
  Filled 2023-09-29: qty 1

## 2023-09-29 MED ORDER — ONDANSETRON 4 MG PO TBDP: 4.0000 mg | ORAL_TABLET | Freq: Three times a day (TID) | ORAL | 0 refills | Status: DC | PRN

## 2023-09-29 MED ORDER — ONDANSETRON HCL 4 MG/2ML IJ SOLN
4.0000 mg | Freq: Once | INTRAMUSCULAR | Status: AC
  Administered 2023-09-29: 4 mg via INTRAVENOUS
  Filled 2023-09-29: qty 2

## 2023-09-29 MED ORDER — SODIUM CHLORIDE 0.9 % IV BOLUS
1000.0000 mL | Freq: Once | INTRAVENOUS | Status: AC
  Administered 2023-09-29: 1000 mL via INTRAVENOUS

## 2023-09-29 MED ORDER — TAMSULOSIN HCL 0.4 MG PO CAPS: 0.4000 mg | ORAL_CAPSULE | Freq: Every day | ORAL | 0 refills | Status: AC

## 2023-09-29 NOTE — ED Provider Notes (Signed)
  Physical Exam  BP 102/61   Pulse (!) 59   Temp 97.8 F (36.6 C)   Resp 17   Wt 79.4 kg   SpO2 91%   BMI 31.00 kg/m   Physical Exam  Procedures  Procedures  ED Course / MDM     Received care of patient from Dr. Manus Gunning. Presents with concern for flank pain after stent removal yesterday.   CT shows hydroureteronephrosis from string of distal ureteral calculi.  UA 11-20WBC, pending culture. WBC 19500, CMP without acute abnormalities.    Discussed with Dr. Berneice Heinrich who reviewed the imaging. Given no fever or tachycardia feels she can follow up closely with Dr. Pete Glatter as an outpatient.  She feels improved in the ED. Given toradol for pain, is tolerating po.  Given rocephin.  Given rx for keflx, flomax, zofran and reviewed [place] drug database and given rx for hydrocodone. Discussed reasons to return in detail.    Alvira Monday, MD 09/29/23 2149

## 2023-09-29 NOTE — ED Notes (Signed)
Pt alert and oriented X 4 at the time of discharge. RR even and unlabored. No acute distress noted. Pt & family member verbalized understanding of discharge instructions as discussed. Pt ambulatory to lobby at time of discharge.

## 2023-09-29 NOTE — ED Notes (Signed)
Pt states she is unable to obtain urine sample at this time

## 2023-09-29 NOTE — ED Triage Notes (Signed)
Patient had stent placed two weeks ago, had it removed today and has increased pain and n/v/d since then.  Patient reports she has only passed small fragments of the kidney stone.

## 2023-09-29 NOTE — Telephone Encounter (Signed)
stent was taken out by [place] yesterday and started having terrible nausea and vomiting and pain around 2pm. Couldn't keep any pain meds down. Came to the ED here at about 3am. Did a ct this moring. waiting for the results on that. Was given pain meds in the ER downstairs and they gave her fluids. the pain is coming back and GI cramping cause it is wearing off.   Just wanted to inform Dr Pete Glatter

## 2023-09-29 NOTE — ED Provider Notes (Signed)
[facility] Provider Note   CSN: [id number] Arrival date & time: 09/29/23  0300     History  Chief Complaint  Patient presents with   Post-op Problem    Meredith Finley is a 62 y.o. female.  Patient reports ureteroscopy for 22 mm kidney stone with stent placement 2 weeks ago.  She saw her urologist Dr. Pete Glatter in follow-up today.  Her stent was removed this afternoon.  Upon returning home and urinating she began to have severe flank pain that radiates to her right lower abdomen associated with the passage of kidney stone fragments.  Pain was not controlled at home with Norco and she was having nausea, vomiting and diarrhea as well as pain with urination.  States she passed a lot of sediment and stone fragments but this has improved.  She is much more comfortable after receiving pain medication in triage.  Her pain now is 0.  She reports the pain earlier was the pain she has had in the past from passing kidney stones. Denies fever, chills.  She has been Macrobid and has 1 dose left. Still has appendix.  No ovaries or uterus present.  No gallbladder present.  History of atrial fibrillation not on anticoagulation.  The history is provided by the patient and the spouse.       Home Medications Prior to Admission medications   Medication Sig Start Date End Date Taking? Authorizing Provider  oxybutynin (DITROPAN) 5 MG tablet Take 1 tablet (5 mg total) by mouth every 8 (eight) hours as needed for bladder spasms. 09/23/23   Stoneking, Danford Bad., MD  Cholecalciferol (VITAMIN D3) 25 MCG (1000 UT) CAPS Take 1,000 Units by mouth 3 (three) times a week.    [provider]  cyanocobalamin (VITAMIN B12) 1000 MCG tablet Take 1,000 mcg by mouth 3 (three) times a week.    [provider]  ferrous sulfate 324 MG TBEC Take 324 mg by mouth every 14 (fourteen) days.    [provider]  HYDROcodone-acetaminophen (NORCO/VICODIN) 5-325  MG tablet Take 1 tablet by mouth every 4 (four) hours as needed for moderate pain (pain score 4-6). 09/15/23 09/14/24  Stoneking, Danford Bad., MD  levalbuterol [facility] HFA) 45 MCG/ACT inhaler Inhale 2 puffs into the lungs every 6 (six) hours as needed for wheezing. Patient taking differently: Inhale 2 puffs into the lungs every 30 (thirty) days. 09/09/21   Jetty Duhamel D, MD  MAGNESIUM PO Take 5 mLs by mouth 3 (three) times a week. magnesium carbonate 175 mg    [provider]  metFORMIN (GLUCOPHAGE-XR) 500 MG 24 hr tablet TAKE 1 TABLET(500 MG) BY MOUTH DAILY 03/14/22   Romero Belling, MD  Multiple Vitamin (MULTIVITAMIN) tablet Take 1 tablet by mouth daily.    [provider]  nitrofurantoin, macrocrystal-monohydrate, (MACROBID) 100 MG capsule Take 1 capsule (100 mg total) by mouth daily for 14 days. 09/15/23 09/29/23  Stoneking, Danford Bad., MD  phenazopyridine (PYRIDIUM) 200 MG tablet Take 1 tablet (200 mg total) by mouth 3 (three) times daily as needed for pain. 09/15/23 09/14/24  Stoneking, Danford Bad., MD  polyethylene glycol powder (GLYCOLAX/MIRALAX) 17 GM/SCOOP powder Take 17 g by mouth 2 (two) times a week.    [provider]  tamsulosin (FLOMAX) 0.4 MG CAPS capsule Take 1 capsule (0.4 mg total) by mouth daily. 09/15/23   Stoneking, Danford Bad., MD      Allergies    Beta adrenergic blockers, Niacin, Other, Penicillin  g, Bystolic [nebivolol hcl], Iohexol, Ioxaglate, Ivp dye [iodinated contrast media], and Metoprolol    Review of Systems   Review of Systems  Constitutional:  Negative for activity change, appetite change and fever.  HENT:  Negative for congestion and rhinorrhea.   Respiratory:  Negative for cough, chest tightness and shortness of breath.   Gastrointestinal:  Positive for abdominal pain, diarrhea, nausea and vomiting.  Genitourinary:  Positive for dysuria, flank pain, frequency, hematuria and urgency. Negative for pelvic pain.  Musculoskeletal:  Positive  for back pain.  Skin:  Negative for rash.  Neurological:  Negative for dizziness, weakness and headaches.   all other systems are negative except as noted in the HPI and PMH.    Physical Exam Updated Vital Signs BP 138/72 (BP Location: Left Arm)   Pulse 69   Temp 97.8 F (36.6 C) (Oral)   Resp 18   Wt 79.4 kg   SpO2 99%   BMI 31.00 kg/m  Physical Exam Vitals and nursing note reviewed.  Constitutional:      General: She is not in acute distress.    Appearance: She is well-developed.     Comments: Comfortable  HENT:     Head: Normocephalic and atraumatic.     Mouth/Throat:     Pharynx: No oropharyngeal exudate.  Eyes:     Conjunctiva/sclera: Conjunctivae normal.     Pupils: Pupils are equal, round, and reactive to light.  Neck:     Comments: No meningismus. Cardiovascular:     Rate and Rhythm: Normal rate and regular rhythm.     Heart sounds: Normal heart sounds. No murmur heard. Pulmonary:     Effort: Pulmonary effort is normal. No respiratory distress.     Breath sounds: Normal breath sounds.  Abdominal:     Palpations: Abdomen is soft.     Tenderness: There is abdominal tenderness. There is no guarding or rebound.     Comments: Right sided abdominal tenderness, no guarding or rebound  Musculoskeletal:        General: Tenderness present. Normal range of motion.     Cervical back: Normal range of motion and neck supple.     Comments: Right CVA tenderness  Skin:    General: Skin is warm.  Neurological:     Mental Status: She is alert and oriented to person, place, and time.     Cranial Nerves: No cranial nerve deficit.     Motor: No abnormal muscle tone.     Coordination: Coordination normal.     Comments:  5/5 strength throughout. CN 2-12 intact.Equal grip strength.   Psychiatric:        Behavior: Behavior normal.     ED Results / Procedures / Treatments   Labs (all labs ordered are listed, but only abnormal results are displayed) Labs Reviewed  CBC WITH  DIFFERENTIAL/PLATELET - Abnormal; Notable for the following components:      Result Value   WBC 19.5 (*)    Neutro Abs 17.0 (*)    Abs Immature Granulocytes 0.08 (*)    All other components within normal limits  COMPREHENSIVE METABOLIC PANEL - Abnormal; Notable for the following components:   Glucose, Bld 146 (*)    Total Protein 8.2 (*)    Total Bilirubin 1.3 (*)    All other components within normal limits  URINALYSIS, ROUTINE W REFLEX MICROSCOPIC - Abnormal; Notable for the following components:   APPearance CLOUDY (*)    Hgb urine dipstick TRACE (*)  Ketones, ur 40 (*)    Protein, ur 30 (*)    All other components within normal limits  URINALYSIS, MICROSCOPIC (REFLEX) - Abnormal; Notable for the following components:   Bacteria, UA FEW (*)    All other components within normal limits  URINE CULTURE    EKG None  Radiology CT Renal Stone Study  Result Date: 09/29/2023 CLINICAL DATA:  Abdominal/flank pain with stone suspected EXAM: CT ABDOMEN AND PELVIS WITHOUT CONTRAST TECHNIQUE: Multidetector CT imaging of the abdomen and pelvis was performed following the standard protocol without IV contrast. RADIATION DOSE REDUCTION: This exam was performed according to the departmental dose-optimization program which includes automated exposure control, adjustment of the mA and/or kV according to patient size and/or use of iterative reconstruction technique. COMPARISON:  09/01/2012 FINDINGS: Lower chest:  No contributory findings. Hepatobiliary: No focal liver abnormality.No evidence of biliary obstruction or stone. Pancreas: Unremarkable. Spleen: Unremarkable. Adrenals/Urinary Tract: Negative adrenals. Mild-to-moderate right hydroureteronephrosis due to a string of distal ureteral calculi which are confluent and measures 3 cm in length. Multiple right renal calculi with cluster of stones at the lower pole measuring 7 mm. Multiple left renal calculi with cluster of stones at the upper pole  measuring 8 mm. No left hydronephrosis. Stomach/Bowel:  No obstruction. No visible bowel inflammation. Vascular/Lymphatic: No acute vascular abnormality. No mass or adenopathy. Reproductive:Hysterectomy. Other: No ascites or pneumoperitoneum. Musculoskeletal: No acute abnormalities. IMPRESSION: 1. Right hydroureteronephrosis from a string of distal ureteral calculi. 2. Multiple calculi affecting the right more than left kidney. Electronically Signed   By: Tiburcio Pea M.D.   On: 09/29/2023 06:57   DG Abdomen 1 View  Result Date: 09/29/2023 CLINICAL DATA:  Increased pain with nausea and vomiting. Recent stent removal. EXAM: ABDOMEN - 1 VIEW COMPARISON:  Radiograph from yesterday FINDINGS: Calculi over the bilateral kidney, greatest stone conglomerate over the left upper pole measuring nearly 9 mm. Linear stippled calcification in line with the right ureter involving a 4 cm length. Stone over the lower pole right kidney measuring 5 mm. The bowel gas pattern is normal. IMPRESSION: 1. Removal of right ureteral stent. Steinstrasse appearance in the right pelvis suggesting multiple distal ureteral calculi. 2. Bilateral nephrolithiasis. Electronically Signed   By: Tiburcio Pea M.D.   On: 09/29/2023 04:44    Procedures Procedures    Medications Ordered in ED Medications  sodium chloride 0.9 % bolus 1,000 mL (has no administration in time range)  ondansetron (ZOFRAN) injection 4 mg (4 mg Intravenous Given 09/29/23 0320)  ketorolac (TORADOL) 15 MG/ML injection 15 mg (15 mg Intravenous Given 09/29/23 0321)  HYDROmorphone (DILAUDID) injection 1 mg (1 mg Intravenous Given 09/29/23 0322)    ED Course/ Medical Decision Making/ A&P                                 Medical Decision Making Amount and/or Complexity of Data Reviewed Independent Historian: spouse Labs: ordered. Decision-making details documented in ED Course. Radiology: ordered and independent interpretation performed. Decision-making  details documented in ED Course. ECG/medicine tests: ordered and independent interpretation performed. Decision-making details documented in ED Course.  Risk Prescription drug management.   Right sided flank and abdominal pain with nausea and vomiting.  Ureteral stent pulled today.  Stable vital signs.  No distress.  Abdomen soft without peritoneal signs  Vital stable.  No fever. Patient given IV fluids, pain and nausea medications.  Labs show significant leukocytosis of 19. Creatinine  is at baseline.  She remains pain-free after medications received in triage.  CT scan is delayed due to technical issues.  X-ray does show concern for multiple retained stone fragments on the right side  Empiric Rocephin given.  Urine culture sent.  CT scan shows hydronephrosis and string of stones in the right lower ureter.  Patient remains pain-free.  Call to urology pending at shift change.  Anticipate she will be able to be discharged home with symptomatic control plus or minus antibiotics.  Dr. Dalene Seltzer to assume care at shift change.         Final Clinical Impression(s) / ED Diagnoses Final diagnoses:  Post-operative pain    Rx / DC Orders ED Discharge Orders     None         Cas Tracz, Jeannett Senior, MD 09/29/23 [phone]

## 2023-09-30 ENCOUNTER — Other Ambulatory Visit: Payer: Self-pay | Admitting: Urology

## 2023-09-30 LAB — URINE CULTURE: Culture: <10000 — AB

## 2023-09-30 LAB — MICROSCOPIC EXAMINATION: RBC, Urine: >30 /[HPF] — AB (ref 0–2)

## 2023-09-30 LAB — URINALYSIS, ROUTINE W REFLEX MICROSCOPIC
Bilirubin, UA: NEGATIVE
Ketones, UA: NEGATIVE
Nitrite, UA: POSITIVE — AB
Specific Gravity, UA: 1.025 (ref 1.005–1.030)
Urobilinogen, Ur: 0.2 mg/dL (ref 0.2–1.0)
pH, UA: 6 (ref 5.0–7.5)

## 2023-09-30 MED ORDER — FLUCONAZOLE 150 MG PO TABS: 150.0000 mg | ORAL_TABLET | Freq: Once | ORAL | 0 refills | Status: AC

## 2023-10-01 ENCOUNTER — Other Ambulatory Visit: Payer: Self-pay

## 2023-10-01 DIAGNOSIS — N200 Calculus of kidney: Secondary | ICD-10-CM

## 2023-10-02 ENCOUNTER — Ambulatory Visit (HOSPITAL_BASED_OUTPATIENT_CLINIC_OR_DEPARTMENT_OTHER)
Admission: RE | Admit: 2023-10-02 | Discharge: 2023-10-02 | Disposition: A | Discharge status: Home / Self Care | Payer: Medicaid Other | Source: Ambulatory Visit | Attending: Urology | Admitting: Urology

## 2023-10-02 ENCOUNTER — Encounter: Payer: Self-pay | Admitting: Urology

## 2023-10-02 ENCOUNTER — Ambulatory Visit: Payer: Medicaid Other | Admitting: Urology

## 2023-10-02 VITALS — BP 120/78 | HR 79

## 2023-10-02 DIAGNOSIS — N201 Calculus of ureter: Secondary | ICD-10-CM | POA: Diagnosis not present

## 2023-10-02 DIAGNOSIS — N200 Calculus of kidney: Secondary | ICD-10-CM | POA: Diagnosis present

## 2023-10-02 LAB — URINALYSIS, ROUTINE W REFLEX MICROSCOPIC
Bilirubin, UA: NEGATIVE
Ketones, UA: NEGATIVE
Nitrite, UA: POSITIVE — AB
Specific Gravity, UA: >1.03 — ABNORMAL HIGH (ref 1.005–1.030)
Urobilinogen, Ur: 1 mg/dL (ref 0.2–1.0)
pH, UA: 5.5 (ref 5.0–7.5)

## 2023-10-02 LAB — MICROSCOPIC EXAMINATION: WBC, UA: >30 /[HPF] — AB (ref 0–5)

## 2023-10-02 NOTE — Progress Notes (Signed)
Assessment: 1. Ureteral calculus   2. Nephrolithiasis     Plan: I reviewed the records from the patient's recent ER visit including provider notes, lab results, and imaging studies. I personally reviewed the KUB and CT scan from 09/29/2023 with results as noted below. I reviewed the KUB study from today.  There continues to be multiple small calcifications in the area of the right distal ureter measuring 2-3 cm in length consistent with Steinstrasse.  No obvious calcifications were seen in the area of the right renal shadow although there is stool obscuring the lower pole.  2 calcifications are seen in the left renal shadow consistent with known renal calculi. I discussed these findings with the patient in detail today.  I discussed options including continued attempts at passage of the stone fragments versus ureteroscopic stone manipulation.  As she is currently pain-free and is not displaying any signs of infection, I think it would be reasonable to allow her some more time to try to pass these fragments.  She agrees and would like to continue with attempts at spontaneous passage.  I advised her that should her pain return or if she develops any signs of infection, she should contact the office or go to the emergency room for management. Strain urine Continue tamsulosin for MET Return to office next week with KUB.  Chief Complaint:  Chief Complaint  Patient presents with   Nephrolithiasis    History of Present Illness:  Meredith Finley is a 62 y.o. female who is seen for further evaluation of microscopic hematuria and nephrolithiasis.   She has a history of nephrolithiasis undergoing shockwave lithotripsy and ureteroscopy in the 1990s.  She has not had any recent stone symptoms.  She recently underwent CT evaluation for microscopic hematuria.  Dipstick urinalysis in the office showed trace blood.   CT imaging from 05/19/2023 showed nonobstructing renal calculi bilaterally with a branching  stone in the superior pole of the right kidney measuring 22 mm, and 2 nonobstructing stones in the left measuring 12 and 6 mm in size.  She also had a right upper pole renal cyst measuring 13 mm. Urine culture from 9/24:  10-25K mixed flora She developed UTI symptoms and had a Resolve MDx urine culture sent on 08/11/23 which grew E. Coli and enterococcus. She was treated with Macrobid x 10 days.  Her symptoms resolved. She underwent surgical management of the right renal calculus on 09/15/2023 with right ureteroscopic laser lithotripsy and insertion of a right ureteral stent.  The right renal calculus was fragmented with the laser. Her right ureteral stent was removed on 09/28/2023. She developed right-sided flank pain with associated nausea and vomiting on 09/29/2023 and presented to the emergency room. 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  CT imaging showed mild to moderate right hydronephrosis due to a string of distal ureteral calculi, cluster of stones in the right lower pole and left renal calculi. Creatinine was normal at 0.89.  WBC elevated at 19.5.  Urine culture grew <10K colonies. She was treated with pain medication, IV antibiotics, IV fluids, and discharged home.  She returns today for follow-up.  She has not had any significant pain since leaving the emergency room.  No fevers or chills.  She passed number of small stone fragments yesterday.  No nausea or vomiting.  She continues on tamsulosin and Pyridium.   Portions of the above documentation were copied from a prior visit for review purposes only.  Past Medical History:  Past Medical History:  Diagnosis Date   A-fib (HCC)    Allergy    Anxiety    Asthma    Cataract    Diabetes mellitus without complication (HCC)    Gestational Diabetes   Dysrhythmia    Endometriosis 1996   GERD (gastroesophageal reflux disease)    H/O osteopenia    History of kidney stones    HLD  (hyperlipidemia)    Hx of varicella    Osteopenia    Palpitations    Prediabetes    Sleep apnea    Transfusion history    placental abruption 1986   Tremor     Past Surgical History:  Past Surgical History:  Procedure Laterality Date   ABDOMINAL HYSTERECTOMY     ABDOMINAL SURGERY     for congenital kidney abnormality   CARPAL TUNNEL RELEASE     CESAREAN SECTION  [phone]   CYSTOSCOPY WITH RETROGRADE PYELOGRAM, URETEROSCOPY AND STENT PLACEMENT Right 09/15/2023   Procedure: CYSTOSCOPY WITH BILATERAL RETROGRADE PYELOGRAM,RIGHT  URETEROSCOPY,LASER LITHOTRIPSY AND STENT PLACEMENT;  Surgeon: Milderd Meager., MD;  Location: [facility];  Service: Urology;  Laterality: Right;   LAPAROSCOPIC SALPINGO OOPHERECTOMY     LITHOTRIPSY  1992-1996   multiple   NOSE SURGERY     surgical fusion disk 5& 6     TONSILLECTOMY      Allergies:  Allergies  Allergen Reactions   Beta Adrenergic Blockers Other (See Comments)    Fatigue,, Rash   Niacin Nausea And Vomiting and Rash    Other Reaction(s): GI Intolerance   Other Other (See Comments) and Rash    Fatigue, Rash    Penicillin G Rash    Other reaction(s): Unknown   Bystolic [Nebivolol Hcl]     Rash    Iohexol     Other Reaction(s): Not available   Ioxaglate    Ivp Dye [Iodinated Contrast Media] Hives    Had a reaction to IV contrast in the late 1980s.   We discussed that the contrast used today is much better tolerated.   She should be able to tolerate a CT with contrast or cath if she needs.   I would give her pre medication ( steroids, benadryl, pepcid.    Metoprolol Rash and Other (See Comments)    Fatigue, soreactic rash    Family History:  Family History  Problem Relation Age of Onset   Cancer Mother        Colon cancer, breast cancer and lung cancer   Tremor Mother    Colon cancer Mother 15       died at 60   Lung cancer Mother        never smoked or around cig smoke   Arthritis Father    Vision loss Father         Macular degeneration   Scoliosis Father    Lymphoma Brother 67   Obesity Brother    Hashimoto's thyroiditis Daughter    Osteoporosis Maternal Grandmother    Esophageal cancer Neg Hx    Rectal cancer Neg Hx    Stomach cancer Neg Hx     Social History:  Social History   Tobacco Use   Smoking status: Never   Smokeless tobacco: Never  Vaping Use   Vaping status: Never Used  Substance Use Topics   Alcohol use: No   Drug use: No    ROS: Constitutional:  Negative for fever, chills, weight loss CV: Negative for chest  pain, previous MI, hypertension Respiratory:  Negative for shortness of breath, wheezing, sleep apnea, frequent cough GI:  Negative for nausea, vomiting, bloody stool, GERD  Physical exam: BP 120/78   Pulse 79  GENERAL APPEARANCE:  Well appearing, well developed, well nourished, NAD HEENT:  Atraumatic, normocephalic, oropharynx clear NECK:  Supple without lymphadenopathy or thyromegaly ABDOMEN:  Soft, non-tender, no masses EXTREMITIES:  Moves all extremities well, without clubbing, cyanosis, or edema NEUROLOGIC:  Alert and oriented x 3, normal gait, CN II-XII grossly intact MENTAL STATUS:  appropriate BACK:  Non-tender to palpation, No CVAT SKIN:  Warm, dry, and intact  Results: Results for orders placed or performed in visit on 10/02/23 (from the past 24 hour(s))  Urinalysis, Routine w reflex microscopic     Status: Abnormal   Collection Time: 10/02/23  2:45 PM  Result Value Ref Range   Specific Gravity, UA >1.030 (H) 1.005 - 1.030   pH, UA 5.5 5.0 - 7.5   Color, UA Orange Yellow   Appearance Ur Hazy (A) Clear   Leukocytes,UA Trace (A) Negative   Protein,UA 1+ (A) Negative/Trace   Glucose, UA Trace (A) Negative   Ketones, UA Negative Negative   RBC, UA Trace (A) Negative   Bilirubin, UA Negative Negative   Urobilinogen, Ur 1.0 0.2 - 1.0 mg/dL   Nitrite, UA Positive (A) Negative   Microscopic Examination See below:    Narrative   Performed at:  01 -  [email or web address]  Urology [facility] [address], [place], [place]  [id number] Lab Director: Corinna Lines MT, Phone:  [phone]  Microscopic Examination     Status: Abnormal   Collection Time: 10/02/23  2:45 PM   Urine  Result Value Ref Range   WBC, UA >30 (A) 0 - 5 /hpf   RBC, Urine 3-10 (A) 0 - 2 /hpf   Epithelial Cells (non renal) 0-10 0 - 10 /hpf   Casts Present (A) None seen /lpf   Cast Type Granular casts (A) N/A   Crystals Present (A) N/A   Crystal Type Calcium Oxalate N/A   Mucus, UA Present (A) Not Estab.   Bacteria, UA Few (A) None seen/Few   Narrative   Performed at:  01 [facility]  Urology [facility] [address], [place], [place]  [id number] Lab Director: Corinna Lines MT, Phone:  [phone]

## 2023-10-06 ENCOUNTER — Other Ambulatory Visit: Payer: Self-pay

## 2023-10-06 DIAGNOSIS — N201 Calculus of ureter: Secondary | ICD-10-CM

## 2023-10-06 DIAGNOSIS — N200 Calculus of kidney: Secondary | ICD-10-CM

## 2023-10-07 ENCOUNTER — Ambulatory Visit: Payer: Medicaid Other | Admitting: Urology

## 2023-10-07 ENCOUNTER — Encounter: Payer: Self-pay | Admitting: Urology

## 2023-10-07 ENCOUNTER — Ambulatory Visit (HOSPITAL_BASED_OUTPATIENT_CLINIC_OR_DEPARTMENT_OTHER)
Admission: RE | Admit: 2023-10-07 | Discharge: 2023-10-07 | Disposition: A | Discharge status: Home / Self Care | Payer: Medicaid Other | Source: Ambulatory Visit | Attending: Urology | Admitting: Urology

## 2023-10-07 VITALS — BP 115/72 | HR 74

## 2023-10-07 DIAGNOSIS — N200 Calculus of kidney: Secondary | ICD-10-CM | POA: Insufficient documentation

## 2023-10-07 DIAGNOSIS — N201 Calculus of ureter: Secondary | ICD-10-CM

## 2023-10-07 LAB — MICROSCOPIC EXAMINATION

## 2023-10-07 LAB — URINALYSIS, ROUTINE W REFLEX MICROSCOPIC
Bilirubin, UA: NEGATIVE
Glucose, UA: NEGATIVE
Ketones, UA: NEGATIVE
Nitrite, UA: NEGATIVE
Protein,UA: NEGATIVE
Specific Gravity, UA: 1.025 (ref 1.005–1.030)
Urobilinogen, Ur: 0.2 mg/dL (ref 0.2–1.0)
pH, UA: 6 (ref 5.0–7.5)

## 2023-10-07 NOTE — Progress Notes (Signed)
Assessment: 1. Ureteral calculus   2. Nephrolithiasis     Plan: I reviewed the KUB study from today which shows persistent calcifications in the right pelvis consistent with fragments in the distal right ureter.  This area is significantly decreased in size as compared to the study from 10/02/2023 and now measures approximately 1.5 cm in length.  This remains consistent with residual stone fragments within the distal right ureter. She is currently symptom-free and is passing fragments and larger amounts. Will continue conservative management with the expectation that she will be able to pass the remaining fragments. Strain urine Continue tamsulosin for MET Return to office in 10-14 days with KUB.  Chief Complaint:  Chief Complaint  Patient presents with   Nephrolithiasis    History of Present Illness:  Meredith Finley is a 62 y.o. female who is seen for further evaluation of microscopic hematuria and nephrolithiasis.   She has a history of nephrolithiasis undergoing shockwave lithotripsy and ureteroscopy in the 1990s.  She has not had any recent stone symptoms.  She recently underwent CT evaluation for microscopic hematuria.  Dipstick urinalysis in the office showed trace blood.   CT imaging from 05/19/2023 showed nonobstructing renal calculi bilaterally with a branching stone in the superior pole of the right kidney measuring 22 mm, and 2 nonobstructing stones in the left measuring 12 and 6 mm in size.  She also had a right upper pole renal cyst measuring 13 mm. Urine culture from 9/24:  10-25K mixed flora She developed UTI symptoms and had a Resolve MDx urine culture sent on 08/11/23 which grew E. Coli and enterococcus. She was treated with Macrobid x 10 days.  Her symptoms resolved. She underwent surgical management of the right renal calculus on 09/15/2023 with right ureteroscopic laser lithotripsy and insertion of a right ureteral stent.  The right renal calculus was fragmented with the  laser. Her right ureteral stent was removed on 09/28/2023. She developed right-sided flank pain with associated nausea and vomiting on 09/29/2023 and presented to the emergency room. 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  CT imaging showed mild to moderate right hydronephrosis due to a string of distal ureteral calculi, cluster of stones in the right lower pole and left renal calculi. Creatinine was normal at 0.89.  WBC elevated at 19.5.  Urine culture grew <10K colonies. She was treated with pain medication, IV antibiotics, IV fluids, and discharged home. She had not had any significant pain since leaving the emergency room.  No fevers or chills.  She passed number of small stone fragments.  No nausea or vomiting.  She continues on tamsulosin and Pyridium. KUB from 10/02/2023 showed multiple calcifications in the area of the right distal ureter measuring 2 to 3 cm in length consistent with Steinstrasse.  She returns today for follow-up.  She reports that she has passed a number of fragments since her visit on 10/02/2023.  She has not had any flank pain.  No fevers, chills, nausea, or vomiting.  She continues on tamsulosin.  Portions of the above documentation were copied from a prior visit for review purposes only.  Past Medical History:  Past Medical History:  Diagnosis Date   A-fib [facility])    Allergy    Anxiety    Asthma    Cataract    Diabetes mellitus without complication (HCC)    Gestational Diabetes   Dysrhythmia    Endometriosis 1996   GERD (gastroesophageal reflux disease)  H/O osteopenia    History of kidney stones    HLD (hyperlipidemia)    Hx of varicella    Osteopenia    Palpitations    Prediabetes    Sleep apnea    Transfusion history    placental abruption 1986   Tremor     Past Surgical History:  Past Surgical History:  Procedure Laterality Date   ABDOMINAL HYSTERECTOMY     ABDOMINAL SURGERY     for  congenital kidney abnormality   CARPAL TUNNEL RELEASE     CESAREAN SECTION  [phone]   CYSTOSCOPY WITH RETROGRADE PYELOGRAM, URETEROSCOPY AND STENT PLACEMENT Right 09/15/2023   Procedure: CYSTOSCOPY WITH BILATERAL RETROGRADE PYELOGRAM,RIGHT  URETEROSCOPY,LASER LITHOTRIPSY AND STENT PLACEMENT;  Surgeon: Milderd Meager., MD;  Location: [facility];  Service: Urology;  Laterality: Right;   LAPAROSCOPIC SALPINGO OOPHERECTOMY     LITHOTRIPSY  1992-1996   multiple   NOSE SURGERY     surgical fusion disk 5& 6     TONSILLECTOMY      Allergies:  Allergies  Allergen Reactions   Beta Adrenergic Blockers Other (See Comments)    Fatigue,, Rash   Niacin Nausea And Vomiting and Rash    Other Reaction(s): GI Intolerance   Other Other (See Comments) and Rash    Fatigue, Rash    Penicillin G Rash    Other reaction(s): Unknown   Bystolic [Nebivolol Hcl]     Rash    Iohexol     Other Reaction(s): Not available   Ioxaglate    Ivp Dye [Iodinated Contrast Media] Hives    Had a reaction to IV contrast in the late 1980s.   We discussed that the contrast used today is much better tolerated.   She should be able to tolerate a CT with contrast or cath if she needs.   I would give her pre medication ( steroids, benadryl, pepcid.    Metoprolol Rash and Other (See Comments)    Fatigue, soreactic rash    Family History:  Family History  Problem Relation Age of Onset   Cancer Mother        Colon cancer, breast cancer and lung cancer   Tremor Mother    Colon cancer Mother 83       died at 44   Lung cancer Mother        never smoked or around cig smoke   Arthritis Father    Vision loss Father        Macular degeneration   Scoliosis Father    Lymphoma Brother 37   Obesity Brother    Hashimoto's thyroiditis Daughter    Osteoporosis Maternal Grandmother    Esophageal cancer Neg Hx    Rectal cancer Neg Hx    Stomach cancer Neg Hx     Social History:  Social History   Tobacco Use   Smoking  status: Never   Smokeless tobacco: Never  Vaping Use   Vaping status: Never Used  Substance Use Topics   Alcohol use: No   Drug use: No    ROS: Constitutional:  Negative for fever, chills, weight loss CV: Negative for chest pain, previous MI, hypertension Respiratory:  Negative for shortness of breath, wheezing, sleep apnea, frequent cough GI:  Negative for nausea, vomiting, bloody stool, GERD  Physical exam: BP 115/72   Pulse 74  GENERAL APPEARANCE:  Well appearing, well developed, well nourished, NAD HEENT:  Atraumatic, normocephalic, oropharynx clear NECK:  Supple without lymphadenopathy or thyromegaly ABDOMEN:  Soft, non-tender, no masses EXTREMITIES:  Moves all extremities well, without clubbing, cyanosis, or edema NEUROLOGIC:  Alert and oriented x 3, normal gait, CN II-XII grossly intact MENTAL STATUS:  appropriate BACK:  Non-tender to palpation, No CVAT SKIN:  Warm, dry, and intact  Results: U/A: 6-10 WBC, 3-10 RBC, few bacteria

## 2023-10-12 ENCOUNTER — Ambulatory Visit: Payer: Medicaid Other | Admitting: Urology

## 2023-10-16 ENCOUNTER — Ambulatory Visit: Payer: Self-pay | Admitting: Urology

## 2023-10-16 ENCOUNTER — Encounter: Payer: Self-pay | Admitting: Urology

## 2023-10-16 ENCOUNTER — Ambulatory Visit: Payer: Medicaid Other | Admitting: Urology

## 2023-10-16 ENCOUNTER — Other Ambulatory Visit: Payer: Self-pay

## 2023-10-16 ENCOUNTER — Ambulatory Visit (HOSPITAL_BASED_OUTPATIENT_CLINIC_OR_DEPARTMENT_OTHER)
Admission: RE | Admit: 2023-10-16 | Discharge: 2023-10-16 | Disposition: A | Discharge status: Home / Self Care | Payer: Medicaid Other | Source: Ambulatory Visit | Attending: Urology | Admitting: Urology

## 2023-10-16 VITALS — BP 115/77 | HR 77

## 2023-10-16 DIAGNOSIS — N201 Calculus of ureter: Secondary | ICD-10-CM

## 2023-10-16 DIAGNOSIS — N200 Calculus of kidney: Secondary | ICD-10-CM | POA: Diagnosis present

## 2023-10-16 NOTE — H&P (View-Only) (Signed)
 Assessment: 1. Nephrolithiasis   2. Ureteral calculus     Plan: I reviewed the KUB study from today.  I do not see any obvious evidence of calcifications in the area of the right distal ureter.  The right kidney is difficult to visualize due to overlying bowel gas and stool.  2 calcifications are seen in the area of the left renal shadow with the largest measuring 8 mm in size in the mid renal shadow. It appears that she has passed the ureteral fragments. She is doing very well from a clinical standpoint. She is interested in treatment of the left sided stone, if possible for the end of the year. Will schedule for left ESL.  Procedure: The patient will be scheduled for left ESL at [facility].  Surgical request is placed with the surgery schedulers and will be scheduled at the patient's/family request. Informed consent is given as documented below. Anesthesia:  local  The patient does not have sleep apnea, history of MRSA, history of VRE, history of cardiac device requiring special anesthetic needs. Patient is stable and considered clear for surgical in an outpatient ambulatory surgery setting as well as patient hospital setting.  Consent for Operation or Procedure: Provider Certification I hereby certify that the nature, purpose, benefits, usual and most frequent risks of, and alternatives to, the operation or procedure have been explained to the patient (or person authorized to sign for the patient) either by me as responsible physician or by the provider who is to perform the operation or procedure. Time spent such that the patient/family has had an opportunity to ask questions, and that those questions have been answered. The patient or the patient's representative has been advised that selected tasks may be performed by assistants to the primary health care provider(s). I believe that the patient (or person authorized to sign for the patient) understands what has been  explained, and has consented to the operation or procedure. No guarantees were implied or made.   Chief Complaint:  Chief Complaint  Patient presents with   Nephrolithiasis    History of Present Illness:  Meredith Finley is a 62 y.o. female who is seen for further evaluation of microscopic hematuria and nephrolithiasis.   She has a history of nephrolithiasis undergoing shockwave lithotripsy and ureteroscopy in the 1990s.  She has not had any recent stone symptoms.  She recently underwent CT evaluation for microscopic hematuria.  Dipstick urinalysis in the office showed trace blood.   CT imaging from 05/19/2023 showed nonobstructing renal calculi bilaterally with a branching stone in the superior pole of the right kidney measuring 22 mm, and 2 nonobstructing stones in the left measuring 12 and 6 mm in size.  She also had a right upper pole renal cyst measuring 13 mm. Urine culture from 9/24:  10-25K mixed flora She developed UTI symptoms and had a Resolve MDx urine culture sent on 08/11/23 which grew E. Coli and enterococcus. She was treated with Macrobid x 10 days.  Her symptoms resolved. She underwent surgical management of the right renal calculus on 09/15/2023 with right ureteroscopic laser lithotripsy and insertion of a right ureteral stent.  The right renal calculus was fragmented with the laser. Her right ureteral stent was removed on 09/28/2023. She developed right-sided flank pain with associated nausea and vomiting on 09/29/2023 and presented to the emergency room. 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  CT imaging showed mild to moderate right  hydronephrosis due to a string of distal ureteral calculi, cluster of stones in the right lower pole and left renal calculi. Creatinine was normal at 0.89.  WBC elevated at 19.5.  Urine culture grew <10K colonies. She was treated with pain medication, IV antibiotics, IV fluids, and discharged  home. She had not had any significant pain since leaving the emergency room.  No fevers or chills.  She passed number of small stone fragments.  No nausea or vomiting.  She continues on tamsulosin and Pyridium. KUB from 10/02/2023 showed multiple calcifications in the area of the right distal ureter measuring 2 to 3 cm in length consistent with Steinstrasse. She passed a number of fragments after her visit on 10/02/2023.  She had not had any flank pain.  No fevers, chills, nausea, or vomiting.  She continued on tamsulosin. KUB from 10/07/2023 showed a decrease in the number and length of calcifications in the area of the right distal ureter.  She returns today for follow-up.  She has passed a number of stone fragments since her last visit.  She has not had any flank pain with passage of the fragments.  She has not passed any fragments within the past few days.  No dysuria or gross hematuria.  Portions of the above documentation were copied from a prior visit for review purposes only.  Past Medical History:  Past Medical History:  Diagnosis Date   A-fib (HCC)    Allergy    Anxiety    Asthma    Cataract    Diabetes mellitus without complication (HCC)    Gestational Diabetes   Dysrhythmia    Endometriosis 1996   GERD (gastroesophageal reflux disease)    H/O osteopenia    History of kidney stones    HLD (hyperlipidemia)    Hx of varicella    Osteopenia    Palpitations    Prediabetes    Sleep apnea    Transfusion history    placental abruption 1986   Tremor     Past Surgical History:  Past Surgical History:  Procedure Laterality Date   ABDOMINAL HYSTERECTOMY     ABDOMINAL SURGERY     for congenital kidney abnormality   CARPAL TUNNEL RELEASE     CESAREAN SECTION  [phone]   CYSTOSCOPY WITH RETROGRADE PYELOGRAM, URETEROSCOPY AND STENT PLACEMENT Right 09/15/2023   Procedure: CYSTOSCOPY WITH BILATERAL RETROGRADE PYELOGRAM,RIGHT  URETEROSCOPY,LASER LITHOTRIPSY AND STENT PLACEMENT;   Surgeon: Milderd Meager., MD;  Location: [facility];  Service: Urology;  Laterality: Right;   LAPAROSCOPIC SALPINGO OOPHERECTOMY     LITHOTRIPSY  1992-1996   multiple   NOSE SURGERY     surgical fusion disk 5& 6     TONSILLECTOMY      Allergies:  Allergies  Allergen Reactions   Beta Adrenergic Blockers Other (See Comments)    Fatigue,, Rash   Niacin Nausea And Vomiting and Rash    Other Reaction(s): GI Intolerance   Other Other (See Comments) and Rash    Fatigue, Rash    Penicillin G Rash    Other reaction(s): Unknown   Bystolic [Nebivolol Hcl]     Rash    Iohexol     Other Reaction(s): Not available   Ioxaglate    Ivp Dye [Iodinated Contrast Media] Hives    Had a reaction to IV contrast in the late 1980s.   We discussed that the contrast used today is much better tolerated.   She should be able to tolerate a CT with contrast  or cath if she needs.   I would give her pre medication ( steroids, benadryl, pepcid.    Metoprolol Rash and Other (See Comments)    Fatigue, soreactic rash    Family History:  Family History  Problem Relation Age of Onset   Cancer Mother        Colon cancer, breast cancer and lung cancer   Tremor Mother    Colon cancer Mother 77       died at 20   Lung cancer Mother        never smoked or around cig smoke   Arthritis Father    Vision loss Father        Macular degeneration   Scoliosis Father    Lymphoma Brother 48   Obesity Brother    Hashimoto's thyroiditis Daughter    Osteoporosis Maternal Grandmother    Esophageal cancer Neg Hx    Rectal cancer Neg Hx    Stomach cancer Neg Hx     Social History:  Social History   Tobacco Use   Smoking status: Never   Smokeless tobacco: Never  Vaping Use   Vaping status: Never Used  Substance Use Topics   Alcohol use: No   Drug use: No    ROS: Constitutional:  Negative for fever, chills, weight loss CV: Negative for chest pain, previous MI, hypertension Respiratory:  Negative for  shortness of breath, wheezing, sleep apnea, frequent cough GI:  Negative for nausea, vomiting, bloody stool, GERD  Physical exam: BP 115/77   Pulse 77  GENERAL APPEARANCE:  Well appearing, well developed, well nourished, NAD HEENT:  Atraumatic, normocephalic, oropharynx clear NECK:  Supple without lymphadenopathy or thyromegaly ABDOMEN:  Soft, non-tender, no masses EXTREMITIES:  Moves all extremities well, without clubbing, cyanosis, or edema NEUROLOGIC:  Alert and oriented x 3, normal gait, CN II-XII grossly intact MENTAL STATUS:  appropriate BACK:  Non-tender to palpation, No CVAT SKIN:  Warm, dry, and intact  Results: U/A: 0-5 WBC, 0-2 RBC, few bacteria

## 2023-10-16 NOTE — Progress Notes (Signed)
Assessment: 1. Nephrolithiasis   2. Ureteral calculus     Plan: I reviewed the KUB study from today.  I do not see any obvious evidence of calcifications in the area of the right distal ureter.  The right kidney is difficult to visualize due to overlying bowel gas and stool.  2 calcifications are seen in the area of the left renal shadow with the largest measuring 8 mm in size in the mid renal shadow. It appears that she has passed the ureteral fragments. She is doing very well from a clinical standpoint. She is interested in treatment of the left sided stone, if possible for the end of the year. Will schedule for left ESL.  Procedure: The patient will be scheduled for left ESL at [facility].  Surgical request is placed with the surgery schedulers and will be scheduled at the patient's/family request. Informed consent is given as documented below. Anesthesia:  local  The patient does not have sleep apnea, history of MRSA, history of VRE, history of cardiac device requiring special anesthetic needs. Patient is stable and considered clear for surgical in an outpatient ambulatory surgery setting as well as patient hospital setting.  Consent for Operation or Procedure: Provider Certification I hereby certify that the nature, purpose, benefits, usual and most frequent risks of, and alternatives to, the operation or procedure have been explained to the patient (or person authorized to sign for the patient) either by me as responsible physician or by the provider who is to perform the operation or procedure. Time spent such that the patient/family has had an opportunity to ask questions, and that those questions have been answered. The patient or the patient's representative has been advised that selected tasks may be performed by assistants to the primary health care provider(s). I believe that the patient (or person authorized to sign for the patient) understands what has been  explained, and has consented to the operation or procedure. No guarantees were implied or made.   Chief Complaint:  Chief Complaint  Patient presents with   Nephrolithiasis    History of Present Illness:  Meredith Finley is a 62 y.o. female who is seen for further evaluation of microscopic hematuria and nephrolithiasis.   She has a history of nephrolithiasis undergoing shockwave lithotripsy and ureteroscopy in the 1990s.  She has not had any recent stone symptoms.  She recently underwent CT evaluation for microscopic hematuria.  Dipstick urinalysis in the office showed trace blood.   CT imaging from 05/19/2023 showed nonobstructing renal calculi bilaterally with a branching stone in the superior pole of the right kidney measuring 22 mm, and 2 nonobstructing stones in the left measuring 12 and 6 mm in size.  She also had a right upper pole renal cyst measuring 13 mm. Urine culture from 9/24:  10-25K mixed flora She developed UTI symptoms and had a Resolve MDx urine culture sent on 08/11/23 which grew E. Coli and enterococcus. She was treated with Macrobid x 10 days.  Her symptoms resolved. She underwent surgical management of the right renal calculus on 09/15/2023 with right ureteroscopic laser lithotripsy and insertion of a right ureteral stent.  The right renal calculus was fragmented with the laser. Her right ureteral stent was removed on 09/28/2023. She developed right-sided flank pain with associated nausea and vomiting on 09/29/2023 and presented to the emergency room. 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  CT imaging showed mild to moderate right  hydronephrosis due to a string of distal ureteral calculi, cluster of stones in the right lower pole and left renal calculi. Creatinine was normal at 0.89.  WBC elevated at 19.5.  Urine culture grew <10K colonies. She was treated with pain medication, IV antibiotics, IV fluids, and discharged  home. She had not had any significant pain since leaving the emergency room.  No fevers or chills.  She passed number of small stone fragments.  No nausea or vomiting.  She continues on tamsulosin and Pyridium. KUB from 10/02/2023 showed multiple calcifications in the area of the right distal ureter measuring 2 to 3 cm in length consistent with Steinstrasse. She passed a number of fragments after her visit on 10/02/2023.  She had not had any flank pain.  No fevers, chills, nausea, or vomiting.  She continued on tamsulosin. KUB from 10/07/2023 showed a decrease in the number and length of calcifications in the area of the right distal ureter.  She returns today for follow-up.  She has passed a number of stone fragments since her last visit.  She has not had any flank pain with passage of the fragments.  She has not passed any fragments within the past few days.  No dysuria or gross hematuria.  Portions of the above documentation were copied from a prior visit for review purposes only.  Past Medical History:  Past Medical History:  Diagnosis Date   A-fib (HCC)    Allergy    Anxiety    Asthma    Cataract    Diabetes mellitus without complication (HCC)    Gestational Diabetes   Dysrhythmia    Endometriosis 1996   GERD (gastroesophageal reflux disease)    H/O osteopenia    History of kidney stones    HLD (hyperlipidemia)    Hx of varicella    Osteopenia    Palpitations    Prediabetes    Sleep apnea    Transfusion history    placental abruption 1986   Tremor     Past Surgical History:  Past Surgical History:  Procedure Laterality Date   ABDOMINAL HYSTERECTOMY     ABDOMINAL SURGERY     for congenital kidney abnormality   CARPAL TUNNEL RELEASE     CESAREAN SECTION  [phone]   CYSTOSCOPY WITH RETROGRADE PYELOGRAM, URETEROSCOPY AND STENT PLACEMENT Right 09/15/2023   Procedure: CYSTOSCOPY WITH BILATERAL RETROGRADE PYELOGRAM,RIGHT  URETEROSCOPY,LASER LITHOTRIPSY AND STENT PLACEMENT;   Surgeon: Milderd Meager., MD;  Location: [facility];  Service: Urology;  Laterality: Right;   LAPAROSCOPIC SALPINGO OOPHERECTOMY     LITHOTRIPSY  1992-1996   multiple   NOSE SURGERY     surgical fusion disk 5& 6     TONSILLECTOMY      Allergies:  Allergies  Allergen Reactions   Beta Adrenergic Blockers Other (See Comments)    Fatigue,, Rash   Niacin Nausea And Vomiting and Rash    Other Reaction(s): GI Intolerance   Other Other (See Comments) and Rash    Fatigue, Rash    Penicillin G Rash    Other reaction(s): Unknown   Bystolic [Nebivolol Hcl]     Rash    Iohexol     Other Reaction(s): Not available   Ioxaglate    Ivp Dye [Iodinated Contrast Media] Hives    Had a reaction to IV contrast in the late 1980s.   We discussed that the contrast used today is much better tolerated.   She should be able to tolerate a CT with contrast  or cath if she needs.   I would give her pre medication ( steroids, benadryl, pepcid.    Metoprolol Rash and Other (See Comments)    Fatigue, soreactic rash    Family History:  Family History  Problem Relation Age of Onset   Cancer Mother        Colon cancer, breast cancer and lung cancer   Tremor Mother    Colon cancer Mother 77       died at 20   Lung cancer Mother        never smoked or around cig smoke   Arthritis Father    Vision loss Father        Macular degeneration   Scoliosis Father    Lymphoma Brother 48   Obesity Brother    Hashimoto's thyroiditis Daughter    Osteoporosis Maternal Grandmother    Esophageal cancer Neg Hx    Rectal cancer Neg Hx    Stomach cancer Neg Hx     Social History:  Social History   Tobacco Use   Smoking status: Never   Smokeless tobacco: Never  Vaping Use   Vaping status: Never Used  Substance Use Topics   Alcohol use: No   Drug use: No    ROS: Constitutional:  Negative for fever, chills, weight loss CV: Negative for chest pain, previous MI, hypertension Respiratory:  Negative for  shortness of breath, wheezing, sleep apnea, frequent cough GI:  Negative for nausea, vomiting, bloody stool, GERD  Physical exam: BP 115/77   Pulse 77  GENERAL APPEARANCE:  Well appearing, well developed, well nourished, NAD HEENT:  Atraumatic, normocephalic, oropharynx clear NECK:  Supple without lymphadenopathy or thyromegaly ABDOMEN:  Soft, non-tender, no masses EXTREMITIES:  Moves all extremities well, without clubbing, cyanosis, or edema NEUROLOGIC:  Alert and oriented x 3, normal gait, CN II-XII grossly intact MENTAL STATUS:  appropriate BACK:  Non-tender to palpation, No CVAT SKIN:  Warm, dry, and intact  Results: U/A: 0-5 WBC, 0-2 RBC, few bacteria

## 2023-10-19 LAB — MICROSCOPIC EXAMINATION

## 2023-10-19 LAB — URINALYSIS, ROUTINE W REFLEX MICROSCOPIC
Bilirubin, UA: NEGATIVE
Glucose, UA: NEGATIVE
Ketones, UA: NEGATIVE
Nitrite, UA: NEGATIVE
Protein,UA: NEGATIVE
Specific Gravity, UA: 1.025 (ref 1.005–1.030)
Urobilinogen, Ur: 0.2 mg/dL (ref 0.2–1.0)
pH, UA: 6 (ref 5.0–7.5)

## 2023-10-21 ENCOUNTER — Encounter: Payer: Self-pay | Admitting: Urology

## 2023-11-06 ENCOUNTER — Encounter (HOSPITAL_BASED_OUTPATIENT_CLINIC_OR_DEPARTMENT_OTHER): Payer: Self-pay | Admitting: Urology

## 2023-11-06 NOTE — Progress Notes (Signed)
PreProcedure [employer] Spoke with client personally by phone, verification by DOB, MD, Address, procedure Instructed to arrive to [facility] on Monday, Dec 30th at 1145hrs, provided location information as well Discussion on NPO status and to stop solids at 2400hrs, may have clears until 0500hrs Discussion on laxative to take Sunday afternoon  Denies any alcohol or smoking Informed of appropriate attire for having litho procedure also provided Client states hx of OSA and does utilize a CPAP device, will bring CPAP with her day of procedure Denies any issues with positioning or limitations for laying still for procedure despite previous back surgery Client stated she is doing well and no issues with her hx of afib Reviewed medication list with client and informed which medication she may take and others to hold for procedure Opportunity for questions provided prior to ending phone call

## 2023-11-09 ENCOUNTER — Ambulatory Visit (HOSPITAL_COMMUNITY): Payer: Medicaid Other

## 2023-11-09 ENCOUNTER — Ambulatory Visit (HOSPITAL_BASED_OUTPATIENT_CLINIC_OR_DEPARTMENT_OTHER)
Admission: RE | Admit: 2023-11-09 | Discharge: 2023-11-09 | Disposition: A | Discharge status: Home / Self Care | Payer: Medicaid Other | Attending: Urology | Admitting: Urology

## 2023-11-09 ENCOUNTER — Encounter (HOSPITAL_BASED_OUTPATIENT_CLINIC_OR_DEPARTMENT_OTHER): Payer: Self-pay | Admitting: Urology

## 2023-11-09 ENCOUNTER — Other Ambulatory Visit: Payer: Self-pay

## 2023-11-09 ENCOUNTER — Encounter (HOSPITAL_BASED_OUTPATIENT_CLINIC_OR_DEPARTMENT_OTHER)
Admission: RE | Disposition: A | Discharge status: Home / Self Care | Payer: Self-pay | Source: Home / Self Care | Attending: Urology

## 2023-11-09 DIAGNOSIS — Z01818 Encounter for other preprocedural examination: Secondary | ICD-10-CM

## 2023-11-09 DIAGNOSIS — N200 Calculus of kidney: Secondary | ICD-10-CM

## 2023-11-09 DIAGNOSIS — G4730 Sleep apnea, unspecified: Secondary | ICD-10-CM | POA: Diagnosis not present

## 2023-11-09 DIAGNOSIS — N132 Hydronephrosis with renal and ureteral calculous obstruction: Secondary | ICD-10-CM | POA: Diagnosis present

## 2023-11-09 DIAGNOSIS — E119 Type 2 diabetes mellitus without complications: Secondary | ICD-10-CM | POA: Insufficient documentation

## 2023-11-09 HISTORY — DX: Other specified postprocedural states: R11.2

## 2023-11-09 HISTORY — PX: EXTRACORPOREAL SHOCK WAVE LITHOTRIPSY: SHX1557

## 2023-11-09 LAB — GLUCOSE, CAPILLARY: Glucose-Capillary: 96 mg/dL (ref 70–99)

## 2023-11-09 SURGERY — LITHOTRIPSY, ESWL
Anesthesia: LOCAL | Laterality: Left

## 2023-11-09 MED ORDER — SODIUM CHLORIDE 0.9 % IV SOLN: INTRAVENOUS | Status: DC

## 2023-11-09 MED ORDER — DIPHENHYDRAMINE HCL 25 MG PO CAPS
ORAL_CAPSULE | ORAL | Status: AC
  Filled 2023-11-09: qty 1

## 2023-11-09 MED ORDER — DIAZEPAM 5 MG PO TABS
ORAL_TABLET | ORAL | Status: AC
  Filled 2023-11-09: qty 2

## 2023-11-09 MED ORDER — DIAZEPAM 5 MG PO TABS
10.0000 mg | ORAL_TABLET | ORAL | Status: AC
Start: 2023-11-09 — End: 2023-11-09
  Administered 2023-11-09: 10 mg via ORAL

## 2023-11-09 MED ORDER — CIPROFLOXACIN HCL 500 MG PO TABS
ORAL_TABLET | ORAL | Status: AC
  Filled 2023-11-09: qty 1

## 2023-11-09 MED ORDER — CIPROFLOXACIN HCL 500 MG PO TABS
500.0000 mg | ORAL_TABLET | ORAL | Status: AC
Start: 2023-11-09 — End: 2023-11-09
  Administered 2023-11-09: 500 mg via ORAL

## 2023-11-09 MED ORDER — DIPHENHYDRAMINE HCL 25 MG PO CAPS
25.0000 mg | ORAL_CAPSULE | ORAL | Status: AC
  Administered 2023-11-09: 25 mg via ORAL

## 2023-11-09 NOTE — Op Note (Signed)
Procedure:  Left ESL  Diagnosis:  Left renal calculus  Please see note in chart from [facility].

## 2023-11-09 NOTE — Interval H&P Note (Signed)
History and Physical Interval Note:  11/09/2023 1:25 PM  Meredith Finley  has presented today for surgery, with the diagnosis of left nephrolithiasis.  The various methods of treatment have been discussed with the patient and family. After consideration of risks, benefits and other options for treatment, the patient has consented to  Procedure(s): EXTRACORPOREAL SHOCK WAVE LITHOTRIPSY (ESWL) (Left) as a surgical intervention.  The patient's history has been reviewed, patient examined, no change in status, stable for surgery.  I have reviewed the patient's chart and labs.  Questions were answered to the patient's satisfaction.     [name]

## 2023-11-10 ENCOUNTER — Encounter (HOSPITAL_BASED_OUTPATIENT_CLINIC_OR_DEPARTMENT_OTHER): Payer: Self-pay | Admitting: Urology

## 2023-11-16 ENCOUNTER — Telehealth: Payer: Self-pay | Admitting: Urology

## 2023-11-16 NOTE — Telephone Encounter (Signed)
 Pt called and lvm about apt on the 01/09 called pt back on 11/16/23 lvm Murphy

## 2023-11-18 ENCOUNTER — Other Ambulatory Visit: Payer: Self-pay

## 2023-11-18 DIAGNOSIS — N200 Calculus of kidney: Secondary | ICD-10-CM

## 2023-11-19 ENCOUNTER — Ambulatory Visit (HOSPITAL_BASED_OUTPATIENT_CLINIC_OR_DEPARTMENT_OTHER)
Admission: RE | Admit: 2023-11-19 | Discharge: 2023-11-19 | Disposition: A | Discharge status: Home / Self Care | Payer: Medicaid Other | Source: Ambulatory Visit | Attending: Urology | Admitting: Urology

## 2023-11-19 ENCOUNTER — Encounter: Payer: Self-pay | Admitting: Urology

## 2023-11-19 ENCOUNTER — Ambulatory Visit (INDEPENDENT_AMBULATORY_CARE_PROVIDER_SITE_OTHER): Payer: Medicaid Other | Admitting: Urology

## 2023-11-19 VITALS — BP 108/71 | HR 75 | Ht 63.0 in | Wt 178.0 lb

## 2023-11-19 DIAGNOSIS — N200 Calculus of kidney: Secondary | ICD-10-CM | POA: Insufficient documentation

## 2023-11-19 LAB — MICROSCOPIC EXAMINATION

## 2023-11-19 LAB — URINALYSIS, ROUTINE W REFLEX MICROSCOPIC
Bilirubin, UA: NEGATIVE
Glucose, UA: NEGATIVE
Ketones, UA: NEGATIVE
Leukocytes,UA: NEGATIVE
Nitrite, UA: NEGATIVE
Protein,UA: NEGATIVE
Specific Gravity, UA: 1.025 (ref 1.005–1.030)
Urobilinogen, Ur: 0.2 mg/dL (ref 0.2–1.0)
pH, UA: 6.5 (ref 5.0–7.5)

## 2023-11-19 NOTE — Progress Notes (Signed)
 Assessment: 1. Nephrolithiasis     Plan: I reviewed the KUB study from today showing a cluster of calcifications in the upper left renal shadow and a 2-3 mm calcification in the left lower renal shadow. Return visit in 3 weeks with KUB.  Chief Complaint:  Chief Complaint  Patient presents with   Nephrolithiasis    History of Present Illness:  Meredith Finley is a 63 y.o. female who is seen for further evaluation of microscopic hematuria and nephrolithiasis.   She has a history of nephrolithiasis undergoing shockwave lithotripsy and ureteroscopy in the 1990s.  She has not had any recent stone symptoms.  She recently underwent CT evaluation for microscopic hematuria.  Dipstick urinalysis in the office showed trace blood.   CT imaging from 05/19/2023 showed nonobstructing renal calculi bilaterally with a branching stone in the superior pole of the right kidney measuring 22 mm, and 2 nonobstructing stones in the left measuring 12 and 6 mm in size.  She also had a right upper pole renal cyst measuring 13 mm. Urine culture from 9/24:  10-25K mixed flora She developed UTI symptoms and had a Resolve MDx urine culture sent on 08/11/23 which grew E. Coli and enterococcus. She was treated with Macrobid  x 10 days.  Her symptoms resolved. She underwent surgical management of the right renal calculus on 09/15/2023 with right ureteroscopic laser lithotripsy and insertion of a right ureteral stent.  The right renal calculus was fragmented with the laser. Her right ureteral stent was removed on 09/28/2023. She developed right-sided flank pain with associated nausea and vomiting on 09/29/2023 and presented to the emergency room. 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  CT imaging showed mild to moderate right hydronephrosis due to a string of distal ureteral calculi, cluster of stones in the right lower pole and left renal calculi. Creatinine was normal at  0.89.  WBC elevated at 19.5.  Urine culture grew <10K colonies. She was treated with pain medication, IV antibiotics, IV fluids, and discharged home. She had not had any significant pain since leaving the emergency room.  No fevers or chills.  She passed number of small stone fragments.  No nausea or vomiting.  She continues on tamsulosin  and Pyridium . KUB from 10/02/2023 showed multiple calcifications in the area of the right distal ureter measuring 2 to 3 cm in length consistent with Steinstrasse. She passed a number of fragments after her visit on 10/02/2023.  She had not had any flank pain.  No fevers, chills, nausea, or vomiting.  She continued on tamsulosin . KUB from 10/07/2023 showed a decrease in the number and length of calcifications in the area of the right distal ureter. She passed a number of stone fragments.  No flank pain with passage of the fragments.   KUB from 10/16/2023 showed no obvious fragments in the area of the right distal ureter, and 2 calcifications in the left renal shadow.  She is status post left ESL on 11/10/2023 for the left renal calculi. She returns today for follow-up.  She has done well since the procedure.  She has passed small stone fragments without difficulty.  No flank pain.  No dysuria or gross hematuria.  Portions of the above documentation were copied from a prior visit for review purposes only.  Past Medical History:  Past Medical History:  Diagnosis Date   A-fib [facility])    Allergy    Anxiety    Asthma    Cataract  Diabetes mellitus without complication (HCC)    Gestational Diabetes   Dysrhythmia    Endometriosis 1996   GERD (gastroesophageal reflux disease)    H/O osteopenia    History of kidney stones    HLD (hyperlipidemia)    Hx of varicella    Osteopenia    Palpitations    PONV (postoperative nausea and vomiting)    Prediabetes    Sleep apnea    Transfusion history    placental abruption 1986   Tremor     Past Surgical History:   Past Surgical History:  Procedure Laterality Date   ABDOMINAL HYSTERECTOMY     ABDOMINAL SURGERY     for congenital kidney abnormality   CARPAL TUNNEL RELEASE     CESAREAN SECTION  8013,8009   CYSTOSCOPY WITH RETROGRADE PYELOGRAM, URETEROSCOPY AND STENT PLACEMENT Right 09/15/2023   Procedure: CYSTOSCOPY WITH BILATERAL RETROGRADE PYELOGRAM,RIGHT  URETEROSCOPY,LASER LITHOTRIPSY AND STENT PLACEMENT;  Surgeon: Roseann Adine PARAS., MD;  Location: [facility];  Service: Urology;  Laterality: Right;   EXTRACORPOREAL SHOCK WAVE LITHOTRIPSY Left 11/09/2023   Procedure: EXTRACORPOREAL SHOCK WAVE LITHOTRIPSY (ESWL);  Surgeon: Roseann Adine PARAS., MD;  Location: [facility];  Service: Urology;  Laterality: Left;   LAPAROSCOPIC SALPINGO OOPHERECTOMY     LITHOTRIPSY  1992-1996   multiple   NOSE SURGERY     surgical fusion disk 5& 6     TONSILLECTOMY      Allergies:  Allergies  Allergen Reactions   Beta Adrenergic Blockers Other (See Comments)    Fatigue,, Rash   Niacin Nausea And Vomiting and Rash    Other Reaction(s): GI Intolerance   Penicillin G Rash    Other reaction(s): Unknown   Bystolic  [Nebivolol  Hcl]     Rash    Iohexol      Other Reaction(s): Not available   Ioxaglate    Ivp Dye [Iodinated Contrast Media] Hives    Had a reaction to IV contrast in the late 1980s.   We discussed that the contrast used today is much better tolerated.   She should be able to tolerate a CT with contrast or cath if she needs.   I would give her pre medication ( steroids, benadryl , pepcid.    Metoprolol  Rash and Other (See Comments)    Fatigue, soreactic rash    Family History:  Family History  Problem Relation Age of Onset   Cancer Mother        Colon cancer, breast cancer and lung cancer   Tremor Mother    Colon cancer Mother 47       died at 98   Lung cancer Mother        never smoked or around cig smoke   Arthritis Father    Vision loss Father        Macular degeneration    Scoliosis Father    Lymphoma Brother 13   Obesity Brother    Hashimoto's thyroiditis Daughter    Osteoporosis Maternal Grandmother    Esophageal cancer Neg Hx    Rectal cancer Neg Hx    Stomach cancer Neg Hx     Social History:  Social History   Tobacco Use   Smoking status: Never   Smokeless tobacco: Never  Vaping Use   Vaping status: Never Used  Substance Use Topics   Alcohol use: No   Drug use: No    ROS: Constitutional:  Negative for fever, chills, weight loss CV: Negative for chest pain, previous MI, hypertension Respiratory:  Negative for shortness of breath, wheezing, sleep apnea, frequent cough GI:  Negative for nausea, vomiting, bloody stool, GERD  Physical exam: BP 108/71   Pulse 75   Ht 5' 3 (1.6 m)   Wt 178 lb (80.7 kg)   BMI 31.53 kg/m  GENERAL APPEARANCE:  Well appearing, well developed, well nourished, NAD HEENT:  Atraumatic, normocephalic, oropharynx clear NECK:  Supple without lymphadenopathy or thyromegaly ABDOMEN:  Soft, non-tender, no masses EXTREMITIES:  Moves all extremities well, without clubbing, cyanosis, or edema NEUROLOGIC:  Alert and oriented x 3, normal gait, CN II-XII grossly intact MENTAL STATUS:  appropriate BACK:  Non-tender to palpation, No CVAT SKIN:  Warm, dry, and intact  Results: U/A: 0-5 WBC, 0-2 RBC

## 2023-12-15 ENCOUNTER — Other Ambulatory Visit: Payer: Self-pay

## 2023-12-15 DIAGNOSIS — N200 Calculus of kidney: Secondary | ICD-10-CM

## 2023-12-16 ENCOUNTER — Ambulatory Visit (HOSPITAL_BASED_OUTPATIENT_CLINIC_OR_DEPARTMENT_OTHER)
Admission: RE | Admit: 2023-12-16 | Discharge: 2023-12-16 | Disposition: A | Discharge status: Home / Self Care | Payer: Medicaid Other | Source: Ambulatory Visit | Attending: Urology | Admitting: Urology

## 2023-12-16 ENCOUNTER — Ambulatory Visit: Payer: Medicaid Other | Admitting: Urology

## 2023-12-16 ENCOUNTER — Encounter: Payer: Self-pay | Admitting: Urology

## 2023-12-16 VITALS — BP 107/69 | HR 74 | Ht 63.0 in | Wt 177.0 lb

## 2023-12-16 DIAGNOSIS — N200 Calculus of kidney: Secondary | ICD-10-CM | POA: Insufficient documentation

## 2023-12-16 DIAGNOSIS — Z87442 Personal history of urinary calculi: Secondary | ICD-10-CM

## 2023-12-16 DIAGNOSIS — Z09 Encounter for follow-up examination after completed treatment for conditions other than malignant neoplasm: Secondary | ICD-10-CM

## 2023-12-16 LAB — URINALYSIS, ROUTINE W REFLEX MICROSCOPIC
Bilirubin, UA: NEGATIVE
Glucose, UA: NEGATIVE
Ketones, UA: NEGATIVE
Leukocytes,UA: NEGATIVE
Nitrite, UA: NEGATIVE
Protein,UA: NEGATIVE
Specific Gravity, UA: 1.02 (ref 1.005–1.030)
Urobilinogen, Ur: 0.2 mg/dL (ref 0.2–1.0)
pH, UA: 6 (ref 5.0–7.5)

## 2023-12-16 LAB — MICROSCOPIC EXAMINATION: Renal Epithel, UA: NONE SEEN /[HPF]

## 2023-12-16 NOTE — Progress Notes (Signed)
 Assessment: 1. Nephrolithiasis      Plan: I reviewed the KUB from today.  A cluster of calcifications remains in the upper portion of the left renal shadow not significantly changed from the study from 11/19/2023. Return to office in 1 month with KUB.  Chief Complaint:  Chief Complaint  Patient presents with   Nephrolithiasis    History of Present Illness:  Meredith Finley is a 63 y.o. female who is seen for further evaluation of microscopic hematuria and nephrolithiasis.   She has a history of nephrolithiasis undergoing shockwave lithotripsy and ureteroscopy in the 1990s.  She has not had any recent stone symptoms.  She recently underwent CT evaluation for microscopic hematuria.  Dipstick urinalysis in the office showed trace blood.   CT imaging from 05/19/2023 showed nonobstructing renal calculi bilaterally with a branching stone in the superior pole of the right kidney measuring 22 mm, and 2 nonobstructing stones in the left measuring 12 and 6 mm in size.  She also had a right upper pole renal cyst measuring 13 mm. Urine culture from 9/24:  10-25K mixed flora She developed UTI symptoms and had a Resolve MDx urine culture sent on 08/11/23 which grew E. Coli and enterococcus. She was treated with Macrobid  x 10 days.  Her symptoms resolved. She underwent surgical management of the right renal calculus on 09/15/2023 with right ureteroscopic laser lithotripsy and insertion of a right ureteral stent.  The right renal calculus was fragmented with the laser. Her right ureteral stent was removed on 09/28/2023. She developed right-sided flank pain with associated nausea and vomiting on 09/29/2023 and presented to the emergency room. 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  CT imaging showed mild to moderate right hydronephrosis due to a string of distal ureteral calculi, cluster of stones in the right lower pole and left renal calculi. Creatinine  was normal at 0.89.  WBC elevated at 19.5.  Urine culture grew <10K colonies. She was treated with pain medication, IV antibiotics, IV fluids, and discharged home. She had not had any significant pain since leaving the emergency room.  No fevers or chills.  She passed number of small stone fragments.  No nausea or vomiting.  She continues on tamsulosin  and Pyridium . KUB from 10/02/2023 showed multiple calcifications in the area of the right distal ureter measuring 2 to 3 cm in length consistent with Steinstrasse. She passed a number of fragments after her visit on 10/02/2023.  She had not had any flank pain.  No fevers, chills, nausea, or vomiting.  She continued on tamsulosin . KUB from 10/07/2023 showed a decrease in the number and length of calcifications in the area of the right distal ureter. She passed a number of stone fragments.  No flank pain with passage of the fragments.   KUB from 10/16/2023 showed no obvious fragments in the area of the right distal ureter, and 2 calcifications in the left renal shadow.  She underwent left ESL on 11/10/2023 for the left renal calculi. She passed small stone fragments without difficulty.  No flank pain.  No dysuria or gross hematuria. KUB study from 11/19/23 showed a cluster of calcifications in the upper left renal shadow and a 2-3 mm calcification in the left lower renal shadow.  She returns today for follow-up.  She has not passed any additional stone fragments since her visit last month.  She has had occasional discomfort in the left abdomen.  No dysuria or gross hematuria.  Portions of the above documentation were copied from a prior visit for review purposes only.  Past Medical History:  Past Medical History:  Diagnosis Date   A-fib (HCC)    Allergy    Anxiety    Asthma    Cataract    Diabetes mellitus without complication (HCC)    Gestational Diabetes   Dysrhythmia    Endometriosis 1996   GERD (gastroesophageal reflux disease)    H/O  osteopenia    History of kidney stones    HLD (hyperlipidemia)    Hx of varicella    Osteopenia    Palpitations    PONV (postoperative nausea and vomiting)    Prediabetes    Sleep apnea    Transfusion history    placental abruption 1986   Tremor     Past Surgical History:  Past Surgical History:  Procedure Laterality Date   ABDOMINAL HYSTERECTOMY     ABDOMINAL SURGERY     for congenital kidney abnormality   CARPAL TUNNEL RELEASE     CESAREAN SECTION  8013,8009   CYSTOSCOPY WITH RETROGRADE PYELOGRAM, URETEROSCOPY AND STENT PLACEMENT Right 09/15/2023   Procedure: CYSTOSCOPY WITH BILATERAL RETROGRADE PYELOGRAM,RIGHT  URETEROSCOPY,LASER LITHOTRIPSY AND STENT PLACEMENT;  Surgeon: Roseann Adine PARAS., MD;  Location: [facility];  Service: Urology;  Laterality: Right;   EXTRACORPOREAL SHOCK WAVE LITHOTRIPSY Left 11/09/2023   Procedure: EXTRACORPOREAL SHOCK WAVE LITHOTRIPSY (ESWL);  Surgeon: Roseann Adine PARAS., MD;  Location: [facility];  Service: Urology;  Laterality: Left;   LAPAROSCOPIC SALPINGO OOPHERECTOMY     LITHOTRIPSY  1992-1996   multiple   NOSE SURGERY     surgical fusion disk 5& 6     TONSILLECTOMY      Allergies:  Allergies  Allergen Reactions   Beta Adrenergic Blockers Other (See Comments)    Fatigue,, Rash   Niacin Nausea And Vomiting and Rash    Other Reaction(s): GI Intolerance   Penicillin G Rash    Other reaction(s): Unknown   Bystolic  [Nebivolol  Hcl]     Rash    Iohexol      Other Reaction(s): Not available   Ioxaglate    Ivp Dye [Iodinated Contrast Media] Hives    Had a reaction to IV contrast in the late 1980s.   We discussed that the contrast used today is much better tolerated.   She should be able to tolerate a CT with contrast or cath if she needs.   I would give her pre medication ( steroids, benadryl , pepcid.    Metoprolol  Rash and Other (See Comments)    Fatigue, soreactic rash    Family History:  Family History  Problem  Relation Age of Onset   Cancer Mother        Colon cancer, breast cancer and lung cancer   Tremor Mother    Colon cancer Mother 13       died at 57   Lung cancer Mother        never smoked or around cig smoke   Arthritis Father    Vision loss Father        Macular degeneration   Scoliosis Father    Lymphoma Brother 28   Obesity Brother    Hashimoto's thyroiditis Daughter    Osteoporosis Maternal Grandmother    Esophageal cancer Neg Hx    Rectal cancer Neg Hx    Stomach cancer Neg Hx     Social History:  Social History   Tobacco Use   Smoking status: Never  Smokeless tobacco: Never  Vaping Use   Vaping status: Never Used  Substance Use Topics   Alcohol use: No   Drug use: No    ROS: Constitutional:  Negative for fever, chills, weight loss CV: Negative for chest pain, previous MI, hypertension Respiratory:  Negative for shortness of breath, wheezing, sleep apnea, frequent cough GI:  Negative for nausea, vomiting, bloody stool, GERD   Physical exam: BP 107/69   Pulse 74   Ht 5' 3 (1.6 m)   Wt 177 lb (80.3 kg)   BMI 31.35 kg/m  GENERAL APPEARANCE:  Well appearing, well developed, well nourished, NAD HEENT:  Atraumatic, normocephalic, oropharynx clear NECK:  Supple without lymphadenopathy or thyromegaly ABDOMEN:  Soft, non-tender, no masses EXTREMITIES:  Moves all extremities well, without clubbing, cyanosis, or edema NEUROLOGIC:  Alert and oriented x 3, normal gait, CN II-XII grossly intact MENTAL STATUS:  appropriate BACK:  Non-tender to palpation, No CVAT SKIN:  Warm, dry, and intact  Results: U/A: 0-5 WBC, 0-2 RBC

## 2023-12-24 ENCOUNTER — Encounter: Payer: Self-pay | Admitting: Urology

## 2024-01-12 ENCOUNTER — Other Ambulatory Visit: Payer: Self-pay

## 2024-01-12 DIAGNOSIS — N200 Calculus of kidney: Secondary | ICD-10-CM

## 2024-01-13 ENCOUNTER — Encounter: Payer: Self-pay | Admitting: Urology

## 2024-01-13 ENCOUNTER — Ambulatory Visit (HOSPITAL_BASED_OUTPATIENT_CLINIC_OR_DEPARTMENT_OTHER)
Admission: RE | Admit: 2024-01-13 | Discharge: 2024-01-13 | Disposition: A | Discharge status: Home / Self Care | Source: Ambulatory Visit | Attending: Urology | Admitting: Urology

## 2024-01-13 ENCOUNTER — Ambulatory Visit: Payer: Medicaid Other | Admitting: Urology

## 2024-01-13 VITALS — BP 107/73 | HR 76 | Ht 63.0 in | Wt 180.0 lb

## 2024-01-13 DIAGNOSIS — N200 Calculus of kidney: Secondary | ICD-10-CM

## 2024-01-13 LAB — MICROSCOPIC EXAMINATION

## 2024-01-13 LAB — URINALYSIS, ROUTINE W REFLEX MICROSCOPIC
Bilirubin, UA: NEGATIVE
Glucose, UA: NEGATIVE
Ketones, UA: NEGATIVE
Nitrite, UA: NEGATIVE
Protein,UA: NEGATIVE
RBC, UA: NEGATIVE
Specific Gravity, UA: 1.015 (ref 1.005–1.030)
Urobilinogen, Ur: 0.2 mg/dL (ref 0.2–1.0)
pH, UA: 7 (ref 5.0–7.5)

## 2024-01-13 NOTE — Progress Notes (Signed)
 Assessment: 1. Nephrolithiasis    Plan: I reviewed the KUB from today.  The previously noted calcifications in the upper left renal shadow are not readily visible today possibly due to overlying bowel gas and stool.  There is a calcification seen in the lower left renal shadow which is similar in size and location. I discussed these results with the patient today. Schedule for renal ultrasound in the next few weeks.  I will contact her with results and recommendations for follow-up. She is interested in trying to treat any remaining fragments if present.  I advised her that the stone may be in a renal calyx or calyceal diverticulum which could make access difficult.  Chief Complaint:  Chief Complaint  Patient presents with   Nephrolithiasis    History of Present Illness:  Meredith Finley is a 63 y.o. female who is seen for further evaluation of microscopic hematuria and nephrolithiasis.   She has a history of nephrolithiasis undergoing shockwave lithotripsy and ureteroscopy in the 1990s.  She has not had any recent stone symptoms.  She recently underwent CT evaluation for microscopic hematuria.  Dipstick urinalysis in the office showed trace blood.   CT imaging from 05/19/2023 showed nonobstructing renal calculi bilaterally with a branching stone in the superior pole of the right kidney measuring 22 mm, and 2 nonobstructing stones in the left measuring 12 and 6 mm in size.  She also had a right upper pole renal cyst measuring 13 mm. Urine culture from 9/24:  10-25K mixed flora She developed UTI symptoms and had a Resolve MDx urine culture sent on 08/11/23 which grew E. Coli and enterococcus. She was treated with Macrobid x 10 days.  Her symptoms resolved. She underwent surgical management of the right renal calculus on 09/15/2023 with right ureteroscopic laser lithotripsy and insertion of a right ureteral stent.  The right renal calculus was fragmented with the laser. Her right ureteral stent was  removed on 09/28/2023. She developed right-sided flank pain with associated nausea and vomiting on 09/29/2023 and presented to the emergency room. 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  CT imaging showed mild to moderate right hydronephrosis due to a string of distal ureteral calculi, cluster of stones in the right lower pole and left renal calculi. Creatinine was normal at 0.89.  WBC elevated at 19.5.  Urine culture grew <10K colonies. She was treated with pain medication, IV antibiotics, IV fluids, and discharged home. She had not had any significant pain since leaving the emergency room.  No fevers or chills.  She passed number of small stone fragments.  No nausea or vomiting.  She continues on tamsulosin and Pyridium. KUB from 10/02/2023 showed multiple calcifications in the area of the right distal ureter measuring 2 to 3 cm in length consistent with Steinstrasse. She passed a number of fragments after her visit on 10/02/2023.  She had not had any flank pain.  No fevers, chills, nausea, or vomiting.  She continued on tamsulosin. KUB from 10/07/2023 showed a decrease in the number and length of calcifications in the area of the right distal ureter. She passed a number of stone fragments.  No flank pain with passage of the fragments.   KUB from 10/16/2023 showed no obvious fragments in the area of the right distal ureter, and 2 calcifications in the left renal shadow.  She underwent left ESL on 11/10/2023 for the left renal calculi. She passed small stone fragments without difficulty.  No flank pain.  No dysuria or gross hematuria. KUB study from 11/19/23 showed a cluster of calcifications in the upper left renal shadow and a 2-3 mm calcification in the left lower renal shadow. KUB study from 12/16/2023 showed a cluster of calcifications remains in the upper portion of the left renal shadow not significantly changed from the study from 11/19/2023,  measuring approximately 9-10 mm in total size.  She returns today for follow-up.  She has not passed any additional stone fragments since her visit last month.  She has had occasional discomfort in the left abdomen.  No dysuria or gross hematuria.  Portions of the above documentation were copied from a prior visit for review purposes only.  Past Medical History:  Past Medical History:  Diagnosis Date   A-fib (HCC)    Allergy    Anxiety    Asthma    Cataract    Diabetes mellitus without complication (HCC)    Gestational Diabetes   Dysrhythmia    Endometriosis 1996   GERD (gastroesophageal reflux disease)    H/O osteopenia    History of kidney stones    HLD (hyperlipidemia)    Hx of varicella    Osteopenia    Palpitations    PONV (postoperative nausea and vomiting)    Prediabetes    Sleep apnea    Transfusion history    placental abruption 1986   Tremor     Past Surgical History:  Past Surgical History:  Procedure Laterality Date   ABDOMINAL HYSTERECTOMY     ABDOMINAL SURGERY     for congenital kidney abnormality   CARPAL TUNNEL RELEASE     CESAREAN SECTION  6578,4696   CYSTOSCOPY WITH RETROGRADE PYELOGRAM, URETEROSCOPY AND STENT PLACEMENT Right 09/15/2023   Procedure: CYSTOSCOPY WITH BILATERAL RETROGRADE PYELOGRAM,RIGHT  URETEROSCOPY,LASER LITHOTRIPSY AND STENT PLACEMENT;  Surgeon: Milderd Meager., MD;  Location: [facility];  Service: Urology;  Laterality: Right;   EXTRACORPOREAL SHOCK WAVE LITHOTRIPSY Left 11/09/2023   Procedure: EXTRACORPOREAL SHOCK WAVE LITHOTRIPSY (ESWL);  Surgeon: Milderd Meager., MD;  Location: [facility];  Service: Urology;  Laterality: Left;   LAPAROSCOPIC SALPINGO OOPHERECTOMY     LITHOTRIPSY  1992-1996   multiple   NOSE SURGERY     surgical fusion disk 5& 6     TONSILLECTOMY      Allergies:  Allergies  Allergen Reactions   Beta Adrenergic Blockers Other (See Comments)    Fatigue,, Rash   Niacin Nausea And  Vomiting and Rash    Other Reaction(s): GI Intolerance   Penicillin G Rash    Other reaction(s): Unknown   Bystolic [Nebivolol Hcl]     Rash    Iohexol     Other Reaction(s): Not available   Ioxaglate    Ivp Dye [Iodinated Contrast Media] Hives    Had a reaction to IV contrast in the late 1980s.   We discussed that the contrast used today is much better tolerated.   She should be able to tolerate a CT with contrast or cath if she needs.   I would give her pre medication ( steroids, benadryl, pepcid.    Metoprolol Rash and Other (See Comments)    Fatigue, soreactic rash    Family History:  Family History  Problem Relation Age of Onset   Cancer Mother        Colon cancer, breast cancer and lung cancer   Tremor Mother    Colon cancer Mother 2  died at 3   Lung cancer Mother        never smoked or around cig smoke   Arthritis Father    Vision loss Father        Macular degeneration   Scoliosis Father    Lymphoma Brother 44   Obesity Brother    Hashimoto's thyroiditis Daughter    Osteoporosis Maternal Grandmother    Esophageal cancer Neg Hx    Rectal cancer Neg Hx    Stomach cancer Neg Hx     Social History:  Social History   Tobacco Use   Smoking status: Never   Smokeless tobacco: Never  Vaping Use   Vaping status: Never Used  Substance Use Topics   Alcohol use: No   Drug use: No    ROS: Constitutional:  Negative for fever, chills, weight loss CV: Negative for chest pain, previous MI, hypertension Respiratory:  Negative for shortness of breath, wheezing, sleep apnea, frequent cough GI:  Negative for nausea, vomiting, bloody stool, GERD   Physical exam: BP 107/73   Pulse 76   Ht 5\' 3"  (1.6 m)   Wt 180 lb (81.6 kg)   BMI 31.89 kg/m  GENERAL APPEARANCE:  Well appearing, well developed, well nourished, NAD HEENT:  Atraumatic, normocephalic, oropharynx clear NECK:  Supple without lymphadenopathy or thyromegaly ABDOMEN:  Soft, non-tender, no  masses EXTREMITIES:  Moves all extremities well, without clubbing, cyanosis, or edema NEUROLOGIC:  Alert and oriented x 3, normal gait, CN II-XII grossly intact MENTAL STATUS:  appropriate BACK:  Non-tender to palpation, No CVAT SKIN:  Warm, dry, and intact  Results: Results for orders placed or performed in visit on 01/13/24 (from the past 24 hours)  Urinalysis, Routine w reflex microscopic     Status: Abnormal   Collection Time: 01/13/24 12:00 AM  Result Value Ref Range   Specific Gravity, UA 1.015 1.005 - 1.030   pH, UA 7.0 5.0 - 7.5   Color, UA Yellow Yellow   Appearance Ur Clear Clear   Leukocytes,UA Trace (A) Negative   Protein,UA Negative Negative/Trace   Glucose, UA Negative Negative   Ketones, UA Negative Negative   RBC, UA Negative Negative   Bilirubin, UA Negative Negative   Urobilinogen, Ur 0.2 0.2 - 1.0 mg/dL   Nitrite, UA Negative Negative   Microscopic Examination See below:    Narrative   Performed at:  [address] [facility]  Urology [facility] [address], [place], [place]  [id number] Lab Director: Corinna Lines MT, Phone:  [phone]  Microscopic Examination     Status: None   Collection Time: 01/13/24 12:00 AM   Urine  Result Value Ref Range   WBC, UA 0-5 0 - 5 /hpf   RBC, Urine 0-2 0 - 2 /hpf   Epithelial Cells (non renal) 0-10 0 - 10 /hpf   Crystals Present N/A   Crystal Type Amorphous Sediment N/A   Bacteria, UA Few None seen/Few   Narrative   Performed at:  [facility]  Urology [facility] [address], [place], [place]  [id number] Lab Director: Corinna Lines MT, Phone:  [phone]

## 2024-01-15 ENCOUNTER — Ambulatory Visit (HOSPITAL_BASED_OUTPATIENT_CLINIC_OR_DEPARTMENT_OTHER)
Admission: RE | Admit: 2024-01-15 | Discharge: 2024-01-15 | Disposition: A | Discharge status: Home / Self Care | Source: Ambulatory Visit | Attending: Urology | Admitting: Urology

## 2024-01-15 DIAGNOSIS — N200 Calculus of kidney: Secondary | ICD-10-CM | POA: Diagnosis present

## 2024-01-22 ENCOUNTER — Encounter: Payer: Self-pay | Admitting: Internal Medicine

## 2024-01-25 ENCOUNTER — Other Ambulatory Visit: Payer: Self-pay

## 2024-01-25 DIAGNOSIS — G4733 Obstructive sleep apnea (adult) (pediatric): Secondary | ICD-10-CM

## 2024-02-02 ENCOUNTER — Encounter: Payer: Self-pay | Admitting: Urology

## 2024-02-09 ENCOUNTER — Other Ambulatory Visit: Payer: Self-pay | Admitting: Urology

## 2024-02-09 DIAGNOSIS — N200 Calculus of kidney: Secondary | ICD-10-CM

## 2024-02-15 ENCOUNTER — Ambulatory Visit: Admitting: Urology

## 2024-02-15 ENCOUNTER — Ambulatory Visit: Payer: Self-pay | Admitting: Urology

## 2024-02-15 ENCOUNTER — Encounter: Payer: Self-pay | Admitting: Urology

## 2024-02-15 ENCOUNTER — Encounter (HOSPITAL_COMMUNITY): Payer: Self-pay | Admitting: Urology

## 2024-02-15 VITALS — BP 115/73 | HR 80 | Ht 63.0 in | Wt 180.0 lb

## 2024-02-15 DIAGNOSIS — N200 Calculus of kidney: Secondary | ICD-10-CM

## 2024-02-15 LAB — URINALYSIS, ROUTINE W REFLEX MICROSCOPIC
Bilirubin, UA: NEGATIVE
Glucose, UA: NEGATIVE
Ketones, UA: NEGATIVE
Leukocytes,UA: NEGATIVE
Nitrite, UA: NEGATIVE
Protein,UA: NEGATIVE
Specific Gravity, UA: 1.025 (ref 1.005–1.030)
Urobilinogen, Ur: 0.2 mg/dL (ref 0.2–1.0)
pH, UA: 5.5 (ref 5.0–7.5)

## 2024-02-15 LAB — MICROSCOPIC EXAMINATION

## 2024-02-15 NOTE — Progress Notes (Signed)
 Assessment: 1. Nephrolithiasis     Plan: She is interested in trying to treat remaining stone fragments.  I advised her that the stone may be in a renal calyx or calyceal diverticulum which could make complete clearance of fragments difficult. She has elected to proceed with repeat left shockwave lithotripsy.  Procedure: The patient will be scheduled for left ESL at [facility].  Surgical request is placed with the surgery schedulers and will be scheduled at the patient's/family request. Informed consent is given as documented below. Anesthesia:  local   The patient does not have sleep apnea, history of MRSA, history of VRE, history of cardiac device requiring special anesthetic needs. Patient is stable and considered clear for surgical management in an outpatient ambulatory surgery setting as well as inpatient hospital setting.  Consent for Operation or Procedure: Provider Certification I hereby certify that the nature, purpose, benefits, usual and most frequent risks of, and alternatives to, the operation or procedure have been explained to the patient (or person authorized to sign for the patient) either by me as responsible physician or by the provider who is to perform the operation or procedure. Time spent such that the patient/family has had an opportunity to ask questions, and that those questions have been answered. The patient or the patient's representative has been advised that selected tasks may be performed by assistants to the primary health care provider(s). I believe that the patient (or person authorized to sign for the patient) understands what has been explained, and has consented to the operation or procedure. No guarantees were implied or made.   Chief Complaint:  Chief Complaint  Patient presents with   Nephrolithiasis    History of Present Illness:  Meredith Finley is a 63 y.o. female who is seen for further evaluation of nephrolithiasis.    She has a history of  nephrolithiasis undergoing shockwave lithotripsy and ureteroscopy in the 1990s.  She has not had any recent stone symptoms.  She underwent CT evaluation for microscopic hematuria.  Dipstick urinalysis in the office showed trace blood.   CT imaging from 05/19/2023 showed nonobstructing renal calculi bilaterally with a branching stone in the superior pole of the right kidney measuring 22 mm, and 2 nonobstructing stones in the left measuring 12 and 6 mm in size.  She also had a right upper pole renal cyst measuring 13 mm. Urine culture from 9/24:  10-25K mixed flora She developed UTI symptoms and had a Resolve MDx urine culture sent on 08/11/23 which grew E. Coli and enterococcus. She was treated with Macrobid x 10 days.  Her symptoms resolved. She underwent surgical management of the right renal calculus on 09/15/2023 with right ureteroscopic laser lithotripsy and insertion of a right ureteral stent.  The right renal calculus was fragmented with the laser. Her right ureteral stent was removed on 09/28/2023. She developed right-sided flank pain with associated nausea and vomiting on 09/29/2023 and presented to the emergency room. 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  CT imaging showed mild to moderate right hydronephrosis due to a string of distal ureteral calculi, cluster of stones in the right lower pole and left renal calculi. Creatinine was normal at 0.89.  WBC elevated at 19.5.  Urine culture grew <10K colonies. She was treated with pain medication, IV antibiotics, IV fluids, and discharged home. She had not had any significant pain since leaving the emergency room.  No fevers or chills.  She passed number  of small stone fragments.  No nausea or vomiting.  She continues on tamsulosin and Pyridium. KUB from 10/02/2023 showed multiple calcifications in the area of the right distal ureter measuring 2 to 3 cm in length consistent with Steinstrasse. She  passed a number of fragments after her visit on 10/02/2023.  She had not had any flank pain.  No fevers, chills, nausea, or vomiting.  She continued on tamsulosin. KUB from 10/07/2023 showed a decrease in the number and length of calcifications in the area of the right distal ureter. She passed a number of stone fragments.  No flank pain with passage of the fragments.   KUB from 10/16/2023 showed no obvious fragments in the area of the right distal ureter, and 2 calcifications in the left renal shadow.  She underwent left ESL on 11/10/2023 for the left renal calculi. She passed small stone fragments without difficulty.  No flank pain.  No dysuria or gross hematuria. KUB study from 11/19/23 showed a cluster of calcifications in the upper left renal shadow and a 2-3 mm calcification in the left lower renal shadow. KUB study from 12/16/2023 showed a cluster of calcifications remains in the upper portion of the left renal shadow not significantly changed from the study from 11/19/2023, measuring approximately 9-10 mm in total size. At her visit in March 2025, she had not passed any additional stone fragments since her prior visit.  She reported occasional discomfort in the left abdomen.  No dysuria or gross hematuria.  KUB from 01/13/2024 showed persistent calcifications in the left upper renal shadow and a lower renal shadow calcification.  These were stable in appearance as compared to the prior study. Renal ultrasound from 01/31/2024 showed mild right pelviectasis with a next renal pelvis and bilateral nephrolithiasis with apparent punctate stones on the right.  She is seen today for preoperative assessment in preparation for left ESL next week.  She would like to repeat treatment to the left renal calculi. No recent flank pain.  No dysuria or gross hematuria.   Portions of the above documentation were copied from a prior visit for review purposes only.  Past Medical History:  Past Medical History:   Diagnosis Date   A-fib (HCC)    Allergy    Anxiety    Asthma    Cataract    Diabetes mellitus without complication (HCC)    Gestational Diabetes   Dysrhythmia    Endometriosis 1996   GERD (gastroesophageal reflux disease)    H/O osteopenia    History of kidney stones    HLD (hyperlipidemia)    Hx of varicella    Osteopenia    Palpitations    PONV (postoperative nausea and vomiting)    Prediabetes    Sleep apnea    Transfusion history    placental abruption 1986   Tremor     Past Surgical History:  Past Surgical History:  Procedure Laterality Date   ABDOMINAL HYSTERECTOMY     ABDOMINAL SURGERY     for congenital kidney abnormality   CARPAL TUNNEL RELEASE     CESAREAN SECTION  [id number]   CYSTOSCOPY WITH RETROGRADE PYELOGRAM, URETEROSCOPY AND STENT PLACEMENT Right 09/15/2023   Procedure: CYSTOSCOPY WITH BILATERAL RETROGRADE PYELOGRAM,RIGHT  URETEROSCOPY,LASER LITHOTRIPSY AND STENT PLACEMENT;  Surgeon: Milderd Meager., MD;  Location: [facility];  Service: Urology;  Laterality: Right;   EXTRACORPOREAL SHOCK WAVE LITHOTRIPSY Left 11/09/2023   Procedure: EXTRACORPOREAL SHOCK WAVE LITHOTRIPSY (ESWL);  Surgeon: Milderd Meager., MD;  Location: [facility];  Service: Urology;  Laterality: Left;   LAPAROSCOPIC SALPINGO OOPHERECTOMY     LITHOTRIPSY  1992-1996   multiple   NOSE SURGERY     surgical fusion disk 5& 6     TONSILLECTOMY      Allergies:  Allergies  Allergen Reactions   Beta Adrenergic Blockers Other (See Comments)    Fatigue,, Rash   Niacin Nausea And Vomiting and Rash    Other Reaction(s): GI Intolerance   Penicillin G Rash    Other reaction(s): Unknown   Bystolic [Nebivolol Hcl]     Rash    Iohexol     Other Reaction(s): Not available   Ioxaglate    Ivp Dye [Iodinated Contrast Media] Hives    Had a reaction to IV contrast in the late 1980s.   We discussed that the contrast used today is much better tolerated.   She should be able  to tolerate a CT with contrast or cath if she needs.   I would give her pre medication ( steroids, benadryl, pepcid.    Metoprolol Rash and Other (See Comments)    Fatigue, soreactic rash    Family History:  Family History  Problem Relation Age of Onset   Cancer Mother        Colon cancer, breast cancer and lung cancer   Tremor Mother    Colon cancer Mother 88       died at 35   Lung cancer Mother        never smoked or around cig smoke   Arthritis Father    Vision loss Father        Macular degeneration   Scoliosis Father    Lymphoma Brother 59   Obesity Brother    Hashimoto's thyroiditis Daughter    Osteoporosis Maternal Grandmother    Esophageal cancer Neg Hx    Rectal cancer Neg Hx    Stomach cancer Neg Hx     Social History:  Social History   Tobacco Use   Smoking status: Never   Smokeless tobacco: Never  Vaping Use   Vaping status: Never Used  Substance Use Topics   Alcohol use: No   Drug use: No    ROS: Constitutional:  Negative for fever, chills, weight loss CV: Negative for chest pain, previous MI, hypertension Respiratory:  Negative for shortness of breath, wheezing, sleep apnea, frequent cough GI:  Negative for nausea, vomiting, bloody stool, GERD   Physical exam: BP 115/73   Pulse 80   Ht 5\' 3"  (1.6 m)   Wt 180 lb (81.6 kg)   BMI 31.89 kg/m  GENERAL APPEARANCE:  Well appearing, well developed, well nourished, NAD HEENT:  Atraumatic, normocephalic, oropharynx clear NECK:  Supple without lymphadenopathy or thyromegaly ABDOMEN:  Soft, non-tender, no masses EXTREMITIES:  Moves all extremities well, without clubbing, cyanosis, or edema NEUROLOGIC:  Alert and oriented x 3, normal gait, CN II-XII grossly intact MENTAL STATUS:  appropriate BACK:  Non-tender to palpation, No CVAT SKIN:  Warm, dry, and intact  Results: U/A:  0-5 WBC, 0-2 RBC, few bacteria

## 2024-02-15 NOTE — Addendum Note (Signed)
 Addended by: Lizbeth Bark on: 02/15/2024 01:28 PM   Modules accepted: Orders

## 2024-02-15 NOTE — Progress Notes (Signed)
 Spoke w/ via phone for pre-op interview---Meredith Finley needs dos----KUB, CBG            COVID test -----patient states asymptomatic no test needed Arrive at -------1300 NPO after MN NO Solid Food.  Clear liquids from MN until---7AM Med rec completed. Pt aware to hold ASA/NSAIDs and supplements per [facility] protocol.  Medications to take morning of surgery -----Xopenex, Magnesium(uses to control HR) Diabetic/Weight loss medication ----- No Alcohol or recreational drugs for 24 hours/Tobacco products for 6 hours ---- Patient instructed to bring blue lithotripsy folder, photo id and insurance card day of surgery. Patient aware to have Driver (ride ) / caregiver for 24 hours after surgery ----Meredith Finley [phone] Patient Special Instructions ----- Pre-Op special Instructions ----- take laxative of choice day before procedure Patient verbalized understanding of instructions that were given at this phone interview. Patient denies shortness of breath, chest pain, fever, cough at this phone interview. NO cardiac symptoms recently, no shortness of breath, chest pain, or racing heart rate.

## 2024-02-15 NOTE — H&P (View-Only) (Signed)
 Assessment: 1. Nephrolithiasis     Plan: She is interested in trying to treat remaining stone fragments.  I advised her that the stone may be in a renal calyx or calyceal diverticulum which could make complete clearance of fragments difficult. She has elected to proceed with repeat left shockwave lithotripsy.  Procedure: The patient will be scheduled for left ESL at [facility].  Surgical request is placed with the surgery schedulers and will be scheduled at the patient's/family request. Informed consent is given as documented below. Anesthesia:  local   The patient does not have sleep apnea, history of MRSA, history of VRE, history of cardiac device requiring special anesthetic needs. Patient is stable and considered clear for surgical management in an outpatient ambulatory surgery setting as well as inpatient hospital setting.  Consent for Operation or Procedure: Provider Certification I hereby certify that the nature, purpose, benefits, usual and most frequent risks of, and alternatives to, the operation or procedure have been explained to the patient (or person authorized to sign for the patient) either by me as responsible physician or by the provider who is to perform the operation or procedure. Time spent such that the patient/family has had an opportunity to ask questions, and that those questions have been answered. The patient or the patient's representative has been advised that selected tasks may be performed by assistants to the primary health care provider(s). I believe that the patient (or person authorized to sign for the patient) understands what has been explained, and has consented to the operation or procedure. No guarantees were implied or made.   Chief Complaint:  Chief Complaint  Patient presents with   Nephrolithiasis    History of Present Illness:  Meredith Finley is a 63 y.o. female who is seen for further evaluation of nephrolithiasis.    She has a history of  nephrolithiasis undergoing shockwave lithotripsy and ureteroscopy in the 1990s.  She has not had any recent stone symptoms.  She underwent CT evaluation for microscopic hematuria.  Dipstick urinalysis in the office showed trace blood.   CT imaging from 05/19/2023 showed nonobstructing renal calculi bilaterally with a branching stone in the superior pole of the right kidney measuring 22 mm, and 2 nonobstructing stones in the left measuring 12 and 6 mm in size.  She also had a right upper pole renal cyst measuring 13 mm. Urine culture from 9/24:  10-25K mixed flora She developed UTI symptoms and had a Resolve MDx urine culture sent on 08/11/23 which grew E. Coli and enterococcus. She was treated with Macrobid x 10 days.  Her symptoms resolved. She underwent surgical management of the right renal calculus on 09/15/2023 with right ureteroscopic laser lithotripsy and insertion of a right ureteral stent.  The right renal calculus was fragmented with the laser. Her right ureteral stent was removed on 09/28/2023. She developed right-sided flank pain with associated nausea and vomiting on 09/29/2023 and presented to the emergency room. 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  CT imaging showed mild to moderate right hydronephrosis due to a string of distal ureteral calculi, cluster of stones in the right lower pole and left renal calculi. Creatinine was normal at 0.89.  WBC elevated at 19.5.  Urine culture grew <10K colonies. She was treated with pain medication, IV antibiotics, IV fluids, and discharged home. She had not had any significant pain since leaving the emergency room.  No fevers or chills.  She passed number  of small stone fragments.  No nausea or vomiting.  She continues on tamsulosin and Pyridium. KUB from 10/02/2023 showed multiple calcifications in the area of the right distal ureter measuring 2 to 3 cm in length consistent with Steinstrasse. She  passed a number of fragments after her visit on 10/02/2023.  She had not had any flank pain.  No fevers, chills, nausea, or vomiting.  She continued on tamsulosin. KUB from 10/07/2023 showed a decrease in the number and length of calcifications in the area of the right distal ureter. She passed a number of stone fragments.  No flank pain with passage of the fragments.   KUB from 10/16/2023 showed no obvious fragments in the area of the right distal ureter, and 2 calcifications in the left renal shadow.  She underwent left ESL on 11/10/2023 for the left renal calculi. She passed small stone fragments without difficulty.  No flank pain.  No dysuria or gross hematuria. KUB study from 11/19/23 showed a cluster of calcifications in the upper left renal shadow and a 2-3 mm calcification in the left lower renal shadow. KUB study from 12/16/2023 showed a cluster of calcifications remains in the upper portion of the left renal shadow not significantly changed from the study from 11/19/2023, measuring approximately 9-10 mm in total size. At her visit in March 2025, she had not passed any additional stone fragments since her prior visit.  She reported occasional discomfort in the left abdomen.  No dysuria or gross hematuria.  KUB from 01/13/2024 showed persistent calcifications in the left upper renal shadow and a lower renal shadow calcification.  These were stable in appearance as compared to the prior study. Renal ultrasound from 01/31/2024 showed mild right pelviectasis with a next renal pelvis and bilateral nephrolithiasis with apparent punctate stones on the right.  She is seen today for preoperative assessment in preparation for left ESL next week.  She would like to repeat treatment to the left renal calculi. No recent flank pain.  No dysuria or gross hematuria.   Portions of the above documentation were copied from a prior visit for review purposes only.  Past Medical History:  Past Medical History:   Diagnosis Date   A-fib (HCC)    Allergy    Anxiety    Asthma    Cataract    Diabetes mellitus without complication (HCC)    Gestational Diabetes   Dysrhythmia    Endometriosis 1996   GERD (gastroesophageal reflux disease)    H/O osteopenia    History of kidney stones    HLD (hyperlipidemia)    Hx of varicella    Osteopenia    Palpitations    PONV (postoperative nausea and vomiting)    Prediabetes    Sleep apnea    Transfusion history    placental abruption 1986   Tremor     Past Surgical History:  Past Surgical History:  Procedure Laterality Date   ABDOMINAL HYSTERECTOMY     ABDOMINAL SURGERY     for congenital kidney abnormality   CARPAL TUNNEL RELEASE     CESAREAN SECTION  0865,7846   CYSTOSCOPY WITH RETROGRADE PYELOGRAM, URETEROSCOPY AND STENT PLACEMENT Right 09/15/2023   Procedure: CYSTOSCOPY WITH BILATERAL RETROGRADE PYELOGRAM,RIGHT  URETEROSCOPY,LASER LITHOTRIPSY AND STENT PLACEMENT;  Surgeon: Milderd Meager., MD;  Location: [facility];  Service: Urology;  Laterality: Right;   EXTRACORPOREAL SHOCK WAVE LITHOTRIPSY Left 11/09/2023   Procedure: EXTRACORPOREAL SHOCK WAVE LITHOTRIPSY (ESWL);  Surgeon: Milderd Meager., MD;  Location: [facility];  Service: Urology;  Laterality: Left;   LAPAROSCOPIC SALPINGO OOPHERECTOMY     LITHOTRIPSY  1992-1996   multiple   NOSE SURGERY     surgical fusion disk 5& 6     TONSILLECTOMY      Allergies:  Allergies  Allergen Reactions   Beta Adrenergic Blockers Other (See Comments)    Fatigue,, Rash   Niacin Nausea And Vomiting and Rash    Other Reaction(s): GI Intolerance   Penicillin G Rash    Other reaction(s): Unknown   Bystolic [Nebivolol Hcl]     Rash    Iohexol     Other Reaction(s): Not available   Ioxaglate    Ivp Dye [Iodinated Contrast Media] Hives    Had a reaction to IV contrast in the late 1980s.   We discussed that the contrast used today is much better tolerated.   She should be able  to tolerate a CT with contrast or cath if she needs.   I would give her pre medication ( steroids, benadryl, pepcid.    Metoprolol Rash and Other (See Comments)    Fatigue, soreactic rash    Family History:  Family History  Problem Relation Age of Onset   Cancer Mother        Colon cancer, breast cancer and lung cancer   Tremor Mother    Colon cancer Mother 88       died at 35   Lung cancer Mother        never smoked or around cig smoke   Arthritis Father    Vision loss Father        Macular degeneration   Scoliosis Father    Lymphoma Brother 59   Obesity Brother    Hashimoto's thyroiditis Daughter    Osteoporosis Maternal Grandmother    Esophageal cancer Neg Hx    Rectal cancer Neg Hx    Stomach cancer Neg Hx     Social History:  Social History   Tobacco Use   Smoking status: Never   Smokeless tobacco: Never  Vaping Use   Vaping status: Never Used  Substance Use Topics   Alcohol use: No   Drug use: No    ROS: Constitutional:  Negative for fever, chills, weight loss CV: Negative for chest pain, previous MI, hypertension Respiratory:  Negative for shortness of breath, wheezing, sleep apnea, frequent cough GI:  Negative for nausea, vomiting, bloody stool, GERD   Physical exam: BP 115/73   Pulse 80   Ht 5\' 3"  (1.6 m)   Wt 180 lb (81.6 kg)   BMI 31.89 kg/m  GENERAL APPEARANCE:  Well appearing, well developed, well nourished, NAD HEENT:  Atraumatic, normocephalic, oropharynx clear NECK:  Supple without lymphadenopathy or thyromegaly ABDOMEN:  Soft, non-tender, no masses EXTREMITIES:  Moves all extremities well, without clubbing, cyanosis, or edema NEUROLOGIC:  Alert and oriented x 3, normal gait, CN II-XII grossly intact MENTAL STATUS:  appropriate BACK:  Non-tender to palpation, No CVAT SKIN:  Warm, dry, and intact  Results: U/A:  0-5 WBC, 0-2 RBC, few bacteria

## 2024-02-22 ENCOUNTER — Other Ambulatory Visit: Payer: Self-pay

## 2024-02-22 ENCOUNTER — Encounter (HOSPITAL_COMMUNITY)
Admission: RE | Disposition: A | Discharge status: Home / Self Care | Payer: Self-pay | Source: Home / Self Care | Attending: Urology

## 2024-02-22 ENCOUNTER — Encounter (HOSPITAL_COMMUNITY): Payer: Self-pay | Admitting: Urology

## 2024-02-22 ENCOUNTER — Ambulatory Visit (HOSPITAL_COMMUNITY)
Admission: RE | Admit: 2024-02-22 | Discharge: 2024-02-22 | Disposition: A | Discharge status: Home / Self Care | Attending: Urology | Admitting: Urology

## 2024-02-22 ENCOUNTER — Ambulatory Visit (HOSPITAL_COMMUNITY)

## 2024-02-22 DIAGNOSIS — N2889 Other specified disorders of kidney and ureter: Secondary | ICD-10-CM | POA: Diagnosis not present

## 2024-02-22 DIAGNOSIS — N200 Calculus of kidney: Secondary | ICD-10-CM | POA: Diagnosis present

## 2024-02-22 DIAGNOSIS — N132 Hydronephrosis with renal and ureteral calculous obstruction: Secondary | ICD-10-CM | POA: Insufficient documentation

## 2024-02-22 HISTORY — PX: EXTRACORPOREAL SHOCK WAVE LITHOTRIPSY: SHX1557

## 2024-02-22 SURGERY — LITHOTRIPSY, ESWL
Anesthesia: LOCAL | Laterality: Left

## 2024-02-22 MED ORDER — HYDROCODONE-ACETAMINOPHEN 5-325 MG PO TABS
1.0000 | ORAL_TABLET | Freq: Once | ORAL | Status: AC
  Administered 2024-02-22: 1 via ORAL
  Filled 2024-02-22: qty 1

## 2024-02-22 MED ORDER — CIPROFLOXACIN HCL 500 MG PO TABS
500.0000 mg | ORAL_TABLET | ORAL | Status: AC
  Administered 2024-02-22: 500 mg via ORAL
  Filled 2024-02-22: qty 1

## 2024-02-22 MED ORDER — DIPHENHYDRAMINE HCL 25 MG PO CAPS
25.0000 mg | ORAL_CAPSULE | ORAL | Status: AC
Start: 2024-02-22 — End: 2024-02-22
  Administered 2024-02-22: 25 mg via ORAL
  Filled 2024-02-22: qty 1

## 2024-02-22 MED ORDER — SODIUM CHLORIDE 0.9 % IV SOLN: INTRAVENOUS | Status: DC

## 2024-02-22 MED ORDER — DIAZEPAM 5 MG PO TABS
10.0000 mg | ORAL_TABLET | ORAL | Status: AC
  Administered 2024-02-22: 10 mg via ORAL
  Filled 2024-02-22: qty 2

## 2024-02-22 NOTE — Op Note (Signed)
 Operative Note  Procedure:  left shock wave lithotripsy  Please see Operative Note from [facility] scanned into chart.  Milderd Meager, MD [facility] [phone]

## 2024-02-22 NOTE — Progress Notes (Signed)
 Instructed patient and spouse to wait prescribed amount of time before taking more hydrocodone, using 4:45 pm as the time the last dose was administered.

## 2024-02-22 NOTE — Interval H&P Note (Signed)
 History and Physical Interval Note:  02/22/2024 2:01 PM  [name]  has presented today for surgery, with the diagnosis of Left nephrolithiasis.  The various methods of treatment have been discussed with the patient and family. After consideration of risks, benefits and other options for treatment, the patient has consented to  Procedure(s): LITHOTRIPSY, ESWL (Left) as a surgical intervention.  The patient's history has been reviewed, patient examined, no change in status, stable for surgery.  I have reviewed the patient's chart and labs.  Questions were answered to the patient's satisfaction.     Meredith Finley

## 2024-02-23 ENCOUNTER — Telehealth: Payer: Self-pay | Admitting: Urology

## 2024-02-23 ENCOUNTER — Encounter (HOSPITAL_COMMUNITY): Payer: Self-pay | Admitting: Urology

## 2024-02-23 ENCOUNTER — Other Ambulatory Visit: Payer: Self-pay

## 2024-02-23 DIAGNOSIS — N201 Calculus of ureter: Secondary | ICD-10-CM

## 2024-02-23 DIAGNOSIS — N200 Calculus of kidney: Secondary | ICD-10-CM

## 2024-02-23 NOTE — Telephone Encounter (Signed)
-----   Message from [facility] sent at 02/22/2024  3:31 PM EDT ----- Please schedule post op appt with KUB in 2 weeks

## 2024-02-23 NOTE — Telephone Encounter (Signed)
 LVM for pt to call office back to schedule.  OK to schedule for 04/28 , 04/29, or 04/30 at 8:45am - per crysta

## 2024-03-03 ENCOUNTER — Encounter: Payer: Self-pay | Admitting: Urology

## 2024-03-08 ENCOUNTER — Ambulatory Visit (INDEPENDENT_AMBULATORY_CARE_PROVIDER_SITE_OTHER): Admitting: Urology

## 2024-03-08 ENCOUNTER — Ambulatory Visit (HOSPITAL_BASED_OUTPATIENT_CLINIC_OR_DEPARTMENT_OTHER)
Admission: RE | Admit: 2024-03-08 | Discharge: 2024-03-08 | Disposition: A | Discharge status: Home / Self Care | Source: Ambulatory Visit | Attending: Urology | Admitting: Urology

## 2024-03-08 ENCOUNTER — Encounter: Payer: Self-pay | Admitting: Urology

## 2024-03-08 VITALS — BP 118/77 | HR 63 | Ht 63.0 in | Wt 180.0 lb

## 2024-03-08 DIAGNOSIS — N200 Calculus of kidney: Secondary | ICD-10-CM | POA: Insufficient documentation

## 2024-03-08 DIAGNOSIS — N201 Calculus of ureter: Secondary | ICD-10-CM | POA: Diagnosis present

## 2024-03-08 DIAGNOSIS — Z09 Encounter for follow-up examination after completed treatment for conditions other than malignant neoplasm: Secondary | ICD-10-CM

## 2024-03-08 DIAGNOSIS — Z87442 Personal history of urinary calculi: Secondary | ICD-10-CM

## 2024-03-08 LAB — MICROSCOPIC EXAMINATION

## 2024-03-08 LAB — URINALYSIS, ROUTINE W REFLEX MICROSCOPIC
Bilirubin, UA: NEGATIVE
Glucose, UA: NEGATIVE
Ketones, UA: NEGATIVE
Nitrite, UA: NEGATIVE
Protein,UA: NEGATIVE
Specific Gravity, UA: 1.02 (ref 1.005–1.030)
Urobilinogen, Ur: 0.2 mg/dL (ref 0.2–1.0)
pH, UA: 5.5 (ref 5.0–7.5)

## 2024-03-08 NOTE — Progress Notes (Signed)
 Assessment: 1. Nephrolithiasis     Plan: KUB from today reviewed.  The calcifications seen in the left upper renal shadow are not readily visible today.  No obvious calcifications are seen along the expected course of the left ureter. It appears that she has had good fragmentation of the left renal calculi. She is currently doing well. Recommend follow-up in 6 weeks.   Chief Complaint:  Chief Complaint  Patient presents with   Nephrolithiasis    History of Present Illness:  Meredith Finley is a 63 y.o. female who is seen for further evaluation of nephrolithiasis.    She has a history of nephrolithiasis undergoing shockwave lithotripsy and ureteroscopy in the 1990s.  She has not had any recent stone symptoms.  She underwent CT evaluation for microscopic hematuria.  Dipstick urinalysis in the office showed trace blood.   CT imaging from 05/19/2023 showed nonobstructing renal calculi bilaterally with a branching stone in the superior pole of the right kidney measuring 22 mm, and 2 nonobstructing stones in the left measuring 12 and 6 mm in size.  She also had a right upper pole renal cyst measuring 13 mm. Urine culture from 9/24:  10-25K mixed flora She developed UTI symptoms and had a Resolve MDx urine culture sent on 08/11/23 which grew E. Coli and enterococcus. She was treated with Macrobid  x 10 days.  Her symptoms resolved. She underwent surgical management of the right renal calculus on 09/15/2023 with right ureteroscopic laser lithotripsy and insertion of a right ureteral stent.  The right renal calculus was fragmented with the laser. Her right ureteral stent was removed on 09/28/2023. She developed right-sided flank pain with associated nausea and vomiting on 09/29/2023 and presented to the emergency room. 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  CT imaging showed mild to moderate right hydronephrosis due to a string of distal  ureteral calculi, cluster of stones in the right lower pole and left renal calculi. Creatinine was normal at 0.89.  WBC elevated at 19.5.  Urine culture grew <10K colonies. She was treated with pain medication, IV antibiotics, IV fluids, and discharged home. She had not had any significant pain since leaving the emergency room.  No fevers or chills.  She passed number of small stone fragments.  No nausea or vomiting.  She continues on tamsulosin  and Pyridium . KUB from 10/02/2023 showed multiple calcifications in the area of the right distal ureter measuring 2 to 3 cm in length consistent with Steinstrasse. She passed a number of fragments after her visit on 10/02/2023.  She had not had any flank pain.  No fevers, chills, nausea, or vomiting.  She continued on tamsulosin . KUB from 10/07/2023 showed a decrease in the number and length of calcifications in the area of the right distal ureter. She passed a number of stone fragments.  No flank pain with passage of the fragments.   KUB from 10/16/2023 showed no obvious fragments in the area of the right distal ureter, and 2 calcifications in the left renal shadow.  She underwent left ESL on 11/10/2023 for the left renal calculi. She passed small stone fragments without difficulty.  No flank pain.  No dysuria or gross hematuria. KUB study from 11/19/23 showed a cluster of calcifications in the upper left renal shadow and a 2-3 mm calcification in the left lower renal shadow. KUB study from 12/16/2023 showed a cluster of calcifications remains in the upper portion of the left renal shadow not  significantly changed from the study from 11/19/2023, measuring approximately 9-10 mm in total size. At her visit in March 2025, she had not passed any additional stone fragments since her prior visit.  She reported occasional discomfort in the left abdomen.  No dysuria or gross hematuria.  KUB from 01/13/2024 showed persistent calcifications in the left upper renal shadow and a  lower renal shadow calcification.  These were stable in appearance as compared to the prior study. Renal ultrasound from 01/31/2024 showed mild right pelviectasis with a next renal pelvis and bilateral nephrolithiasis with apparent punctate stones on the right.  She is s/p left ESL on 02/22/24.  The upper pole calculi were treated. She has passed stone fragments. No flank pain today.  No dysuria or gross hematuria.  Portions of the above documentation were copied from a prior visit for review purposes only.  Past Medical History:  Past Medical History:  Diagnosis Date   A-fib (HCC)    Allergy    Anxiety    Asthma    Cataract    Diabetes mellitus without complication (HCC)    Gestational Diabetes   Dysrhythmia    Endometriosis 1996   GERD (gastroesophageal reflux disease)    H/O osteopenia    History of kidney stones    HLD (hyperlipidemia)    Hx of varicella    Osteopenia    Palpitations    PONV (postoperative nausea and vomiting)    Prediabetes    Sleep apnea    Transfusion history    placental abruption 1986   Tremor     Past Surgical History:  Past Surgical History:  Procedure Laterality Date   ABDOMINAL HYSTERECTOMY     ABDOMINAL SURGERY     for congenital kidney abnormality   CARPAL TUNNEL RELEASE     CESAREAN SECTION  [id number]   CYSTOSCOPY WITH RETROGRADE PYELOGRAM, URETEROSCOPY AND STENT PLACEMENT Right 09/15/2023   Procedure: CYSTOSCOPY WITH BILATERAL RETROGRADE PYELOGRAM,RIGHT  URETEROSCOPY,LASER LITHOTRIPSY AND STENT PLACEMENT;  Surgeon: Mellie Sprinkle., MD;  Location: [facility];  Service: Urology;  Laterality: Right;   EXTRACORPOREAL SHOCK WAVE LITHOTRIPSY Left 11/09/2023   Procedure: EXTRACORPOREAL SHOCK WAVE LITHOTRIPSY (ESWL);  Surgeon: Mellie Sprinkle., MD;  Location: [facility];  Service: Urology;  Laterality: Left;   EXTRACORPOREAL SHOCK WAVE LITHOTRIPSY Left 02/22/2024   Procedure: LITHOTRIPSY, ESWL;  Surgeon: Mellie Sprinkle., MD;  Location: [facility];  Service: Urology;  Laterality: Left;   LAPAROSCOPIC SALPINGO OOPHERECTOMY     LITHOTRIPSY  1992-1996   multiple   NOSE SURGERY     surgical fusion disk 5& 6     TONSILLECTOMY      Allergies:  Allergies  Allergen Reactions   Beta Adrenergic Blockers Other (See Comments)    Fatigue,, Rash   Niacin Nausea And Vomiting and Rash    Other Reaction(s): GI Intolerance   Penicillin G Rash    Other reaction(s): Unknown   Bystolic  [Nebivolol  Hcl]     Rash    Iohexol      Other Reaction(s): Not available   Ioxaglate    Ivp Dye [Iodinated Contrast Media] Hives    Had a reaction to IV contrast in the late 1980s.   We discussed that the contrast used today is much better tolerated.   She should be able to tolerate a CT with contrast or cath if she needs.   I would give her pre medication ( steroids, benadryl , pepcid.    Metoprolol  Rash and Other (See  Comments)    Fatigue, soreactic rash    Family History:  Family History  Problem Relation Age of Onset   Cancer Mother        Colon cancer, breast cancer and lung cancer   Tremor Mother    Colon cancer Mother 64       died at 31   Lung cancer Mother        never smoked or around cig smoke   Arthritis Father    Vision loss Father        Macular degeneration   Scoliosis Father    Lymphoma Brother 18   Obesity Brother    Hashimoto's thyroiditis Daughter    Osteoporosis Maternal Grandmother    Esophageal cancer Neg Hx    Rectal cancer Neg Hx    Stomach cancer Neg Hx     Social History:  Social History   Tobacco Use   Smoking status: Never   Smokeless tobacco: Never  Vaping Use   Vaping status: Never Used  Substance Use Topics   Alcohol use: No   Drug use: No    ROS: Constitutional:  Negative for fever, chills, weight loss CV: Negative for chest pain, previous MI, hypertension Respiratory:  Negative for shortness of breath, wheezing, sleep apnea, frequent cough GI:  Negative for nausea,  vomiting, bloody stool, GERD   Physical exam: BP 118/77   Pulse 63   Ht 5\' 3"  (1.6 m)   Wt 180 lb (81.6 kg)   BMI 31.89 kg/m  GENERAL APPEARANCE:  Well appearing, well developed, well nourished, NAD HEENT:  Atraumatic, normocephalic, oropharynx clear NECK:  Supple without lymphadenopathy or thyromegaly ABDOMEN:  Soft, non-tender, no masses EXTREMITIES:  Moves all extremities well, without clubbing, cyanosis, or edema NEUROLOGIC:  Alert and oriented x 3, normal gait, CN II-XII grossly intact MENTAL STATUS:  appropriate BACK:  Non-tender to palpation, No CVAT SKIN:  Warm, dry, and intact  Results: U/A:   0-5 WBCs, 0-2 RBCs

## 2024-04-20 ENCOUNTER — Ambulatory Visit: Admitting: Urology

## 2024-04-20 ENCOUNTER — Encounter: Payer: Self-pay | Admitting: Urology

## 2024-04-20 VITALS — BP 113/76 | HR 83 | Ht 63.0 in | Wt 182.0 lb

## 2024-04-20 DIAGNOSIS — Z09 Encounter for follow-up examination after completed treatment for conditions other than malignant neoplasm: Secondary | ICD-10-CM

## 2024-04-20 DIAGNOSIS — Z87442 Personal history of urinary calculi: Secondary | ICD-10-CM

## 2024-04-20 DIAGNOSIS — N200 Calculus of kidney: Secondary | ICD-10-CM

## 2024-04-20 LAB — MICROSCOPIC EXAMINATION

## 2024-04-20 LAB — URINALYSIS, ROUTINE W REFLEX MICROSCOPIC
Bilirubin, UA: NEGATIVE
Glucose, UA: NEGATIVE
Ketones, UA: NEGATIVE
Leukocytes,UA: NEGATIVE
Nitrite, UA: NEGATIVE
Protein,UA: NEGATIVE
Specific Gravity, UA: 1.025 (ref 1.005–1.030)
Urobilinogen, Ur: 0.2 mg/dL (ref 0.2–1.0)
pH, UA: 5.5 (ref 5.0–7.5)

## 2024-04-20 NOTE — Progress Notes (Signed)
 Assessment: 1. Nephrolithiasis     Plan: Continue stone prevention. 24-hour urine for metabolic evaluation -will contact her with results. Renal ultrasound ordered.  Will contact with results Return to office in 6 months.  Chief Complaint:  Chief Complaint  Patient presents with   Nephrolithiasis    History of Present Illness:  Meredith Finley is a 63 y.o. female who is seen for further evaluation of nephrolithiasis.    She has a history of nephrolithiasis undergoing shockwave lithotripsy and ureteroscopy in the 1990s.  She has not had any recent stone symptoms.  She underwent CT evaluation for microscopic hematuria.  Dipstick urinalysis in the office showed trace blood.   CT imaging from 05/19/2023 showed nonobstructing renal calculi bilaterally with a branching stone in the superior pole of the right kidney measuring 22 mm, and 2 nonobstructing stones in the left measuring 12 and 6 mm in size.  She also had a right upper pole renal cyst measuring 13 mm. Urine culture from 9/24:  10-25K mixed flora She developed UTI symptoms and had a Resolve MDx urine culture sent on 08/11/23 which grew E. Coli and enterococcus. She was treated with Macrobid  x 10 days.  Her symptoms resolved. She underwent surgical management of the right renal calculus on 09/15/2023 with right ureteroscopic laser lithotripsy and insertion of a right ureteral stent.  The right renal calculus was fragmented with the laser. Her right ureteral stent was removed on 09/28/2023. She developed right-sided flank pain with associated nausea and vomiting on 09/29/2023 and presented to the emergency room. 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  CT imaging showed mild to moderate right hydronephrosis due to a string of distal ureteral calculi, cluster of stones in the right lower pole and left renal calculi. Creatinine was normal at 0.89.  WBC elevated at 19.5.  Urine culture  grew <10K colonies. She was treated with pain medication, IV antibiotics, IV fluids, and discharged home. She had not had any significant pain since leaving the emergency room.  No fevers or chills.  She passed number of small stone fragments.  No nausea or vomiting.  She continues on tamsulosin  and Pyridium . KUB from 10/02/2023 showed multiple calcifications in the area of the right distal ureter measuring 2 to 3 cm in length consistent with Steinstrasse. She passed a number of fragments after her visit on 10/02/2023.  She had not had any flank pain.  No fevers, chills, nausea, or vomiting.  She continued on tamsulosin . KUB from 10/07/2023 showed a decrease in the number and length of calcifications in the area of the right distal ureter. She passed a number of stone fragments.  No flank pain with passage of the fragments.   KUB from 10/16/2023 showed no obvious fragments in the area of the right distal ureter, and 2 calcifications in the left renal shadow.  She underwent left ESL on 11/10/2023 for the left renal calculi. She passed small stone fragments without difficulty.  No flank pain.  No dysuria or gross hematuria. KUB study from 11/19/23 showed a cluster of calcifications in the upper left renal shadow and a 2-3 mm calcification in the left lower renal shadow. KUB study from 12/16/2023 showed a cluster of calcifications remains in the upper portion of the left renal shadow not significantly changed from the study from 11/19/2023, measuring approximately 9-10 mm in total size. At her visit in March 2025, she had not passed any additional stone fragments since her  prior visit.  She reported occasional discomfort in the left abdomen.  No dysuria or gross hematuria.  KUB from 01/13/2024 showed persistent calcifications in the left upper renal shadow and a lower renal shadow calcification.  These were stable in appearance as compared to the prior study. Renal ultrasound from 01/31/2024 showed mild right  pelviectasis with a next renal pelvis and bilateral nephrolithiasis with apparent punctate stones on the right.  She is s/p left ESL on 02/22/24.  The upper pole calculi were treated. She passed stone fragments. KUB from 03/08/2024 did not show obvious fragments in the upper left renal shadow.  No obvious calcifications seen along the expected course of the left ureter.  She returns today for follow-up.  She reports passing a number of stone fragments 2 weeks ago.  She did have some discomfort associated with this.  She is not currently having any flank pain.  No dysuria or gross hematuria.  Portions of the above documentation were copied from a prior visit for review purposes only.  Past Medical History:  Past Medical History:  Diagnosis Date   A-fib (HCC)    Allergy    Anxiety    Asthma    Cataract    Diabetes mellitus without complication (HCC)    Gestational Diabetes   Dysrhythmia    Endometriosis 1996   GERD (gastroesophageal reflux disease)    H/O osteopenia    History of kidney stones    HLD (hyperlipidemia)    Hx of varicella    Osteopenia    Palpitations    PONV (postoperative nausea and vomiting)    Prediabetes    Sleep apnea    Transfusion history    placental abruption 1986   Tremor     Past Surgical History:  Past Surgical History:  Procedure Laterality Date   ABDOMINAL HYSTERECTOMY     ABDOMINAL SURGERY     for congenital kidney abnormality   CARPAL TUNNEL RELEASE     CESAREAN SECTION  1610,9604   CYSTOSCOPY WITH RETROGRADE PYELOGRAM, URETEROSCOPY AND STENT PLACEMENT Right 09/15/2023   Procedure: CYSTOSCOPY WITH BILATERAL RETROGRADE PYELOGRAM,RIGHT  URETEROSCOPY,LASER LITHOTRIPSY AND STENT PLACEMENT;  Surgeon: Mellie Sprinkle., MD;  Location: [facility];  Service: Urology;  Laterality: Right;   EXTRACORPOREAL SHOCK WAVE LITHOTRIPSY Left 11/09/2023   Procedure: EXTRACORPOREAL SHOCK WAVE LITHOTRIPSY (ESWL);  Surgeon: Mellie Sprinkle., MD;  Location:  [facility];  Service: Urology;  Laterality: Left;   EXTRACORPOREAL SHOCK WAVE LITHOTRIPSY Left 02/22/2024   Procedure: LITHOTRIPSY, ESWL;  Surgeon: Mellie Sprinkle., MD;  Location: [facility];  Service: Urology;  Laterality: Left;   LAPAROSCOPIC SALPINGO OOPHERECTOMY     LITHOTRIPSY  1992-1996   multiple   NOSE SURGERY     surgical fusion disk 5& 6     TONSILLECTOMY      Allergies:  Allergies  Allergen Reactions   Beta Adrenergic Blockers Other (See Comments)    Fatigue,, Rash   Niacin Nausea And Vomiting and Rash    Other Reaction(s): GI Intolerance   Penicillin G Rash    Other reaction(s): Unknown   Bystolic  [Nebivolol  Hcl]     Rash    Iohexol      Other Reaction(s): Not available   Ioxaglate    Ivp Dye [Iodinated Contrast Media] Hives    Had a reaction to IV contrast in the late 1980s.   We discussed that the contrast used today is much better tolerated.   She should be able to tolerate a  CT with contrast or cath if she needs.   I would give her pre medication ( steroids, benadryl , pepcid.    Metoprolol  Rash and Other (See Comments)    Fatigue, soreactic rash    Family History:  Family History  Problem Relation Age of Onset   Cancer Mother        Colon cancer, breast cancer and lung cancer   Tremor Mother    Colon cancer Mother 97       died at 3   Lung cancer Mother        never smoked or around cig smoke   Arthritis Father    Vision loss Father        Macular degeneration   Scoliosis Father    Lymphoma Brother 17   Obesity Brother    Hashimoto's thyroiditis Daughter    Osteoporosis Maternal Grandmother    Esophageal cancer Neg Hx    Rectal cancer Neg Hx    Stomach cancer Neg Hx     Social History:  Social History   Tobacco Use   Smoking status: Never   Smokeless tobacco: Never  Vaping Use   Vaping status: Never Used  Substance Use Topics   Alcohol use: No   Drug use: No    ROS: Constitutional:  Negative for fever, chills,  weight loss CV: Negative for chest pain, previous MI, hypertension Respiratory:  Negative for shortness of breath, wheezing, sleep apnea, frequent cough GI:  Negative for nausea, vomiting, bloody stool, GERD   Physical exam: BP 113/76   Pulse 83   Ht 5' 3 (1.6 m)   Wt 182 lb (82.6 kg)   BMI 32.24 kg/m  GENERAL APPEARANCE:  Well appearing, well developed, well nourished, NAD HEENT:  Atraumatic, normocephalic, oropharynx clear NECK:  Supple without lymphadenopathy or thyromegaly ABDOMEN:  Soft, non-tender, no masses EXTREMITIES:  Moves all extremities well, without clubbing, cyanosis, or edema NEUROLOGIC:  Alert and oriented x 3, normal gait, CN II-XII grossly intact MENTAL STATUS:  appropriate BACK:  Non-tender to palpation, No CVAT SKIN:  Warm, dry, and intact  Results: U/A: 0-5 WBCs, 0-2 RBCs

## 2024-05-18 ENCOUNTER — Other Ambulatory Visit: Payer: Self-pay | Admitting: Urology

## 2024-05-23 ENCOUNTER — Ambulatory Visit (HOSPITAL_BASED_OUTPATIENT_CLINIC_OR_DEPARTMENT_OTHER)
Admission: RE | Admit: 2024-05-23 | Discharge: 2024-05-23 | Disposition: A | Discharge status: Home / Self Care | Source: Ambulatory Visit | Attending: Urology | Admitting: Urology

## 2024-05-23 DIAGNOSIS — N200 Calculus of kidney: Secondary | ICD-10-CM | POA: Diagnosis present

## 2024-05-24 LAB — LITHOLINK 24HR URINE PANEL
Ammonium, Urine: 34 mmol/(24.h) (ref 15–60)
Calcium Oxalate Saturation: 9.25 (ref 6.00–10.00)
Calcium Phosphate Saturation: 0.57 (ref 0.50–2.00)
Calcium, Urine: 299 mg/(24.h) — ABNORMAL HIGH (ref <200)
Calcium/Creatinine Ratio: 232 mg/g{creat} (ref 51–262)
Calcium/Kg Body Weight: 3.6 mg/kg/d (ref <4.0)
Chloride, Urine: 157 mmol/(24.h) (ref 70–250)
Citrate, Urine: 570 mg/(24.h) (ref >550)
Creatinine, Urine: 1290 mg/(24.h)
Creatinine/Kg Body Weight: 15.4 mg/kg/d (ref 8.7–20.3)
Cystine, Urine, Qualitative: NEGATIVE
Magnesium, Urine: 70 mg/(24.h) (ref 30–120)
Oxalate, Urine: 44 mg/(24.h) — ABNORMAL HIGH (ref 20–40)
Phosphorus, Urine: 943 mg/(24.h) (ref 600–1200)
Potassium, Urine: 51 mmol/(24.h) (ref 20–100)
Protein Catabolic Rate: 1.1 g/kg/d (ref 0.8–1.4)
Sodium, Urine: 159 mmol/(24.h) — ABNORMAL HIGH (ref 50–150)
Sulfate, Urine: 46 meq/(24.h) (ref 20–80)
Urea Nitrogen, Urine: 12.11 g/(24.h) (ref 6.00–14.00)
Uric Acid Saturation: 1.09 — ABNORMAL HIGH (ref <1.00)
Uric Acid, Urine: 694 mg/(24.h) (ref <750)
Urine Volume (Preserved): 2250 mL/(24.h) (ref 500–4000)
pH, 24 hr, Urine: 5.685 — ABNORMAL LOW (ref 5.800–6.200)

## 2024-05-25 ENCOUNTER — Ambulatory Visit: Payer: Self-pay | Admitting: Urology

## 2024-06-08 ENCOUNTER — Ambulatory Visit: Payer: Medicaid Other | Admitting: Urology

## 2024-07-18 ENCOUNTER — Encounter: Payer: Self-pay | Admitting: Urology

## 2024-07-18 ENCOUNTER — Ambulatory Visit: Admitting: Urology

## 2024-07-18 ENCOUNTER — Ambulatory Visit (HOSPITAL_BASED_OUTPATIENT_CLINIC_OR_DEPARTMENT_OTHER)
Admission: RE | Admit: 2024-07-18 | Discharge: 2024-07-18 | Disposition: A | Discharge status: Home / Self Care | Source: Ambulatory Visit | Attending: Urology | Admitting: Urology

## 2024-07-18 ENCOUNTER — Other Ambulatory Visit: Payer: Self-pay | Admitting: Obstetrics and Gynecology

## 2024-07-18 VITALS — BP 112/74 | HR 73 | Ht 63.0 in | Wt 185.0 lb

## 2024-07-18 DIAGNOSIS — Z1231 Encounter for screening mammogram for malignant neoplasm of breast: Secondary | ICD-10-CM

## 2024-07-18 DIAGNOSIS — N200 Calculus of kidney: Secondary | ICD-10-CM

## 2024-07-18 LAB — MICROSCOPIC EXAMINATION

## 2024-07-18 LAB — URINALYSIS, ROUTINE W REFLEX MICROSCOPIC
Bilirubin, UA: NEGATIVE
Glucose, UA: NEGATIVE
Ketones, UA: NEGATIVE
Leukocytes,UA: NEGATIVE
Nitrite, UA: NEGATIVE
Protein,UA: NEGATIVE
Specific Gravity, UA: 1.02 (ref 1.005–1.030)
Urobilinogen, Ur: 0.2 mg/dL (ref 0.2–1.0)
pH, UA: 5.5 (ref 5.0–7.5)

## 2024-07-18 NOTE — Progress Notes (Signed)
 Assessment: 1. Nephrolithiasis     Plan: Continue stone prevention. I reviewed the results of her 24-hour urine as well as the renal ultrasound from July 2025. KUB today. Return to office in 6 months.  Chief Complaint:  Chief Complaint  Patient presents with   Nephrolithiasis    History of Present Illness:  Meredith Finley is a 63 y.o. female who is seen for further evaluation of nephrolithiasis.    She has a history of nephrolithiasis undergoing shockwave lithotripsy and ureteroscopy in the 1990s.  She has not had any recent stone symptoms.  She underwent CT evaluation for microscopic hematuria.  Dipstick urinalysis in the office showed trace blood.   CT imaging from 05/19/2023 showed nonobstructing renal calculi bilaterally with a branching stone in the superior pole of the right kidney measuring 22 mm, and 2 nonobstructing stones in the left measuring 12 and 6 mm in size.  She also had a right upper pole renal cyst measuring 13 mm. Urine culture from 9/24:  10-25K mixed flora She developed UTI symptoms and had a Resolve MDx urine culture sent on 08/11/23 which grew E. Coli and enterococcus. She was treated with Macrobid  x 10 days.  Her symptoms resolved. She underwent surgical management of the right renal calculus on 09/15/2023 with right ureteroscopic laser lithotripsy and insertion of a right ureteral stent.  The right renal calculus was fragmented with the laser. Her right ureteral stent was removed on 09/28/2023. She developed right-sided flank pain with associated nausea and vomiting on 09/29/2023 and presented to the emergency room. 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  CT imaging showed mild to moderate right hydronephrosis due to a string of distal ureteral calculi, cluster of stones in the right lower pole and left renal calculi. Creatinine was normal at 0.89.  WBC elevated at 19.5.  Urine culture grew <10K colonies. She  was treated with pain medication, IV antibiotics, IV fluids, and discharged home. She had not had any significant pain since leaving the emergency room.  No fevers or chills.  She passed number of small stone fragments.  No nausea or vomiting.  She continues on tamsulosin  and Pyridium . KUB from 10/02/2023 showed multiple calcifications in the area of the right distal ureter measuring 2 to 3 cm in length consistent with Steinstrasse. She passed a number of fragments after her visit on 10/02/2023.  She had not had any flank pain.  No fevers, chills, nausea, or vomiting.  She continued on tamsulosin . KUB from 10/07/2023 showed a decrease in the number and length of calcifications in the area of the right distal ureter. She passed a number of stone fragments.  No flank pain with passage of the fragments.   KUB from 10/16/2023 showed no obvious fragments in the area of the right distal ureter, and 2 calcifications in the left renal shadow.  She underwent left ESL on 11/10/2023 for the left renal calculi. She passed small stone fragments without difficulty.  No flank pain.  No dysuria or gross hematuria. KUB study from 11/19/23 showed a cluster of calcifications in the upper left renal shadow and a 2-3 mm calcification in the left lower renal shadow. KUB study from 12/16/2023 showed a cluster of calcifications remains in the upper portion of the left renal shadow not significantly changed from the study from 11/19/2023, measuring approximately 9-10 mm in total size. At her visit in March 2025, she had not passed any additional stone fragments since  her prior visit.  She reported occasional discomfort in the left abdomen.  No dysuria or gross hematuria.  KUB from 01/13/2024 showed persistent calcifications in the left upper renal shadow and a lower renal shadow calcification.  These were stable in appearance as compared to the prior study. Renal ultrasound from 01/31/2024 showed mild right pelviectasis with a next  renal pelvis and bilateral nephrolithiasis with apparent punctate stones on the right.  She is s/p left ESL on 02/22/24.  The upper pole calculi were treated. She passed stone fragments. KUB from 03/08/2024 did not show obvious fragments in the upper left renal shadow.  No obvious calcifications seen along the expected course of the left ureter.  At her visit in June 2025, she reported passing a number of stone fragments.  She did have some discomfort associated with this.    A 24-hour urine from 7/25 showed good urine volume, elevated urine calcium, and slightly elevated urine oxalate.  Dietary modifications recommended. Renal ultrasound from 05/25/2024 showed a 4 mm nonobstructing right renal calculus and a 10 mm nonobstructing left renal calculus without evidence of obstruction.  She returns today for follow-up.  She reports passing some small stones recently.  She thinks she may have passed a stone from each kidney.  She is not having any current kidney stone symptoms.  She is concerned about the renal ultrasound results showing stones in both kidneys.  No dysuria or gross hematuria.  She continues dietary modifications.  Portions of the above documentation were copied from a prior visit for review purposes only.  Past Medical History:  Past Medical History:  Diagnosis Date   A-fib (HCC)    Allergy    Anxiety    Asthma    Cataract    Diabetes mellitus without complication (HCC)    Gestational Diabetes   Dysrhythmia    Endometriosis 1996   GERD (gastroesophageal reflux disease)    H/O osteopenia    History of kidney stones    HLD (hyperlipidemia)    Hx of varicella    Osteopenia    Palpitations    PONV (postoperative nausea and vomiting)    Prediabetes    Sleep apnea    Transfusion history    placental abruption 1986   Tremor     Past Surgical History:  Past Surgical History:  Procedure Laterality Date   ABDOMINAL HYSTERECTOMY     ABDOMINAL SURGERY     for congenital  kidney abnormality   CARPAL TUNNEL RELEASE     CESAREAN SECTION  8013,8009   CYSTOSCOPY WITH RETROGRADE PYELOGRAM, URETEROSCOPY AND STENT PLACEMENT Right 09/15/2023   Procedure: CYSTOSCOPY WITH BILATERAL RETROGRADE PYELOGRAM,RIGHT  URETEROSCOPY,LASER LITHOTRIPSY AND STENT PLACEMENT;  Surgeon: Roseann Adine PARAS., MD;  Location: [facility];  Service: Urology;  Laterality: Right;   EXTRACORPOREAL SHOCK WAVE LITHOTRIPSY Left 11/09/2023   Procedure: EXTRACORPOREAL SHOCK WAVE LITHOTRIPSY (ESWL);  Surgeon: Roseann Adine PARAS., MD;  Location: [facility];  Service: Urology;  Laterality: Left;   EXTRACORPOREAL SHOCK WAVE LITHOTRIPSY Left 02/22/2024   Procedure: LITHOTRIPSY, ESWL;  Surgeon: Roseann Adine PARAS., MD;  Location: [facility];  Service: Urology;  Laterality: Left;   LAPAROSCOPIC SALPINGO OOPHERECTOMY     LITHOTRIPSY  1992-1996   multiple   NOSE SURGERY     surgical fusion disk 5& 6     TONSILLECTOMY      Allergies:  Allergies  Allergen Reactions   Beta Adrenergic Blockers Other (See Comments)    Fatigue,, Rash   Niacin  Nausea And Vomiting and Rash    Other Reaction(s): GI Intolerance   Penicillin G Rash    Other reaction(s): Unknown   Bystolic  [Nebivolol  Hcl]     Rash    Iohexol      Other Reaction(s): Not available   Ioxaglate    Ivp Dye [Iodinated Contrast Media] Hives    Had a reaction to IV contrast in the late 1980s.   We discussed that the contrast used today is much better tolerated.   She should be able to tolerate a CT with contrast or cath if she needs.   I would give her pre medication ( steroids, benadryl , pepcid.    Metoprolol  Rash and Other (See Comments)    Fatigue, soreactic rash    Family History:  Family History  Problem Relation Age of Onset   Cancer Mother        Colon cancer, breast cancer and lung cancer   Tremor Mother    Colon cancer Mother 64       died at 41   Lung cancer Mother        never smoked or around cig smoke   Arthritis  Father    Vision loss Father        Macular degeneration   Scoliosis Father    Lymphoma Brother 7   Obesity Brother    Hashimoto's thyroiditis Daughter    Osteoporosis Maternal Grandmother    Esophageal cancer Neg Hx    Rectal cancer Neg Hx    Stomach cancer Neg Hx     Social History:  Social History   Tobacco Use   Smoking status: Never   Smokeless tobacco: Never  Vaping Use   Vaping status: Never Used  Substance Use Topics   Alcohol use: No   Drug use: No      Physical exam: BP 112/74   Pulse 73   Ht 5' 3 (1.6 m)   Wt 185 lb (83.9 kg)   BMI 32.77 kg/m  GENERAL APPEARANCE:  Well appearing, well developed, well nourished, NAD HEENT:  Atraumatic, normocephalic, oropharynx clear NECK:  Supple without lymphadenopathy or thyromegaly ABDOMEN:  Soft, non-tender, no masses EXTREMITIES:  Moves all extremities well, without clubbing, cyanosis, or edema NEUROLOGIC:  Alert and oriented x 3, normal gait, CN II-XII grossly intact MENTAL STATUS:  appropriate BACK:  Non-tender to palpation, No CVAT SKIN:  Warm, dry, and intact  Results: U/A: 0-5 WBCs, 0-2 RBCs

## 2024-07-26 ENCOUNTER — Ambulatory Visit
Admission: RE | Admit: 2024-07-26 | Discharge: 2024-07-26 | Disposition: A | Discharge status: Home / Self Care | Source: Ambulatory Visit | Attending: Obstetrics and Gynecology | Admitting: Obstetrics and Gynecology

## 2024-07-26 DIAGNOSIS — Z1231 Encounter for screening mammogram for malignant neoplasm of breast: Secondary | ICD-10-CM

## 2024-08-05 ENCOUNTER — Encounter: Payer: Self-pay | Admitting: Gastroenterology

## 2024-08-08 NOTE — Progress Notes (Unsigned)
 HPI F never smoker followed for OSA, Asthma,  complicated by PAFib, OSA,  GERD, Multinodular Goiter, DM2, Osteopenia, Nephrolithiasis,  NPSG 07/08/13- Mild OSA AHI  7.1/hr, Desat to 89%, Epworth 14/24, weight 185 Lbs. ==============================================================   05/05/23- 61 yoF never smoker followed for OSA, Asthma,  complicated by PAFib, OSA,  GERD, Multinodular Goiter, DM2, Osteopenia, Nephrolithiasis,  -Flonase, Xopenex  hfa, Allegra, Melatonin,  CPAP auto 6-8/ [employer] Download compliance- 100%, AHI 1.2/ hr Body weight today-178 lbs Download reviewed.  She is doing quite well with no concerns except that she would like to change DME because of frustrations with communication and billing. Husband Keymoni Mccaster   08/09/24- 63 yoF never smoker followed for OSA, Asthma,  complicated by PAFib, OSA,  GERD, Multinodular Goiter, DM2, Osteopenia, Nephrolithiasis,  -Flonase, Xopenex  hfa, Allegra, Melatonin,  CPAP auto 6-8/ [employer] Download compliance- 97%, AHI 2.1/hr Body weight today-189 lbs Discussed the use of AI scribe software for clinical note transcription with the patient, who gave verbal consent to proceed.  History of Present Illness   Meredith Finley is a 63 year old female with sleep apnea and asthma who presents for follow-up regarding her CPAP therapy and medication management.  She uses her CPAP machine consistently every night with a pressure range between six and eight, resulting in significant improvement in her quality of life. She uses Levalbuterol  several times a week to manage her symptoms and is currently obtaining her medication locally. There are no significant changes or concerns regarding her health, and no seasonal impact on her symptoms. Insurance pays better for brand than for generic levalbuterol .  Flu vaccine.     Assessment and Plan:    Asthma mild persistent uncomplicated Asthma managed with Levalbuterol . Insurance prefers Xopenex .  Symptoms consistent, unaffected by seasonal changes. Prefers 90-day supply. - Prescribe Xopenex  metered inhaler, 90-day supply, refillable for one year. - Instruct pharmacy to dispense brand name Xopenex .  Obstructive sleep apnea Obstructive sleep apnea well-managed with CPAP. Settings effective, less than three apneas per hour. Improved quality of life. - Discussed future treatment advancements, current treatment adjustable as new options arise.      ROS-see HPI   + = positive Constitutional:    weight loss, night sweats, fevers, chills, fatigue, lassitude. HEENT:    headaches, difficulty swallowing, tooth/dental problems, sore throat,       sneezing, itching, ear ache, nasal congestion, post nasal drip, snoring CV:    chest pain, orthopnea, PND, swelling in lower extremities, anasarca,          dizziness, +palpitations Resp:   shortness of breath with exertion or at rest.                productive cough,   non-productive cough, coughing up of blood.              change in color of mucus.  wheezing.   Skin:    rash or lesions. GI:  No-   heartburn, indigestion, abdominal pain, nausea, vomiting, diarrhea,                 change in bowel habits, loss of appetite GU: dysuria, change in color of urine, no urgency or frequency.   flank pain. MS:   joint pain, stiffness, decreased range of motion, back pain. Neuro-     nothing unusual Psych:  change in mood or affect.  depression or anxiety.   memory loss.   OBJ- Physical Exam General- Alert, Oriented, Affect-appropriate, Distress- none acute, + overweight  Skin- r+rosacea Lymphadenopathy- none Head- atraumatic            Eyes- Gross vision intact, PERRLA, conjunctivae and secretions clear            Ears- Hearing, canals-normal            Nose- Clear, no-Septal dev, mucus, polyps, erosion, perforation             Throat- Mallampati IV , mucosa clear , drainage- none, tonsils-absent, + teeth Neck- + anterior surgical scar, trachea  midline, no stridor , thyroid  nl, carotid no bruit Chest - symmetrical excursion , unlabored           Heart/CV- RRR , no murmur , no gallop  , no rub, nl s1 s2                           - JVD- none , edema- none, stasis changes- none, varices- none           Lung- clear to P&A, wheeze- none, cough- none , dullness-none, rub- none           Chest wall-  Abd-  Br/ Gen/ Rectal- Not done, not indicated Extrem- cyanosis- none, clubbing, none, atrophy- none, strength- nl Neuro- grossly intact to observation

## 2024-08-09 ENCOUNTER — Ambulatory Visit: Admitting: Internal Medicine

## 2024-08-09 ENCOUNTER — Encounter: Payer: Self-pay | Admitting: Internal Medicine

## 2024-08-09 VITALS — BP 102/64 | HR 68 | Temp 97.8°F | Ht 63.0 in | Wt 189.0 lb

## 2024-08-09 DIAGNOSIS — J4530 Mild persistent asthma, uncomplicated: Secondary | ICD-10-CM | POA: Diagnosis not present

## 2024-08-09 DIAGNOSIS — G4733 Obstructive sleep apnea (adult) (pediatric): Secondary | ICD-10-CM | POA: Diagnosis not present

## 2024-08-09 DIAGNOSIS — Z23 Encounter for immunization: Secondary | ICD-10-CM | POA: Diagnosis not present

## 2024-08-09 MED ORDER — LEVALBUTEROL TARTRATE 45 MCG/ACT IN AERO: INHALATION_SPRAY | RESPIRATORY_TRACT | 3 refills | Status: AC

## 2024-08-09 MED ORDER — LEVALBUTEROL TARTRATE 45 MCG/ACT IN AERO: INHALATION_SPRAY | RESPIRATORY_TRACT | 3 refills | Status: DC

## 2024-08-09 NOTE — Patient Instructions (Signed)
 We can continue CPAP auto 6-8.  Refill sent for rescue inhaler specifying brand name.

## 2024-08-15 ENCOUNTER — Encounter: Payer: Self-pay | Admitting: Internal Medicine

## 2024-08-15 ENCOUNTER — Telehealth: Payer: Self-pay

## 2024-08-15 ENCOUNTER — Other Ambulatory Visit (HOSPITAL_COMMUNITY): Payer: Self-pay

## 2024-08-15 NOTE — Telephone Encounter (Signed)
*  [facility]  Pharmacy Patient Advocate Encounter   Received notification from Pt Calls Messages that prior authorization for Levalbuerol HFA is required/requested.   Insurance verification completed.   The patient is insured through [employer] [facility] [employer]   Per test claim:  Brand Xopenex  HFA is preferred by the insurance.  If suggested medication is appropriate, Please send in a new RX and discontinue this one. If not, please advise as to why it's not appropriate so that we may request a Prior Authorization. Please note, some preferred medications may still require a PA.  If the suggested medications have not been trialed and there are no contraindications to their use, the PA will not be submitted, as it will not be approved.   Brand Xopenex  HFA- $4.00

## 2024-08-15 NOTE — Telephone Encounter (Signed)
PA needed, please advise

## 2024-08-22 NOTE — Addendum Note (Signed)
 Addended by: MELVENIA WILFORD SAUNDERS on: 08/22/2024 03:41 PM   Modules accepted: Orders

## 2024-09-13 ENCOUNTER — Ambulatory Visit

## 2024-09-13 ENCOUNTER — Other Ambulatory Visit: Payer: Self-pay

## 2024-09-13 VITALS — Ht 63.0 in | Wt 190.0 lb

## 2024-09-13 DIAGNOSIS — Z800 Family history of malignant neoplasm of digestive organs: Secondary | ICD-10-CM

## 2024-09-13 MED ORDER — NA SULFATE-K SULFATE-MG SULF 17.5-3.13-1.6 GM/177ML PO SOLN: 1.0000 | Freq: Once | ORAL | 0 refills | Status: AC

## 2024-09-13 NOTE — Progress Notes (Signed)
 Denies allergies to eggs or soy products. Denies complication of anesthesia or sedation. Denies use of weight loss medication. Denies use of O2.   Emmi instructions given for colonoscopy.

## 2024-09-27 ENCOUNTER — Ambulatory Visit (AMBULATORY_SURGERY_CENTER): Admitting: Gastroenterology

## 2024-09-27 ENCOUNTER — Encounter: Payer: Self-pay | Admitting: Gastroenterology

## 2024-09-27 VITALS — BP 106/67 | HR 63 | Temp 98.2°F | Resp 12 | Ht 63.0 in | Wt 190.0 lb

## 2024-09-27 DIAGNOSIS — K648 Other hemorrhoids: Secondary | ICD-10-CM | POA: Diagnosis not present

## 2024-09-27 DIAGNOSIS — Z800 Family history of malignant neoplasm of digestive organs: Secondary | ICD-10-CM | POA: Diagnosis not present

## 2024-09-27 DIAGNOSIS — Z1211 Encounter for screening for malignant neoplasm of colon: Secondary | ICD-10-CM | POA: Diagnosis not present

## 2024-09-27 MED ORDER — SODIUM CHLORIDE 0.9 % IV SOLN: 500.0000 mL | Freq: Once | INTRAVENOUS | Status: DC

## 2024-09-27 NOTE — Progress Notes (Signed)
 Pt's states no medical or surgical changes since previsit or office visit.

## 2024-09-27 NOTE — Progress Notes (Signed)
 History and Physical:  This patient presents for endoscopic testing for: Encounter Diagnosis  Name Primary?   Family history of malignant neoplasm of gastrointestinal tract Yes    63 yo woman here today for CRC screening at increase risk due to family Hx CRC.  No polyps last colonoscopy August 2019  Patient denies chronic abdominal pain, rectal bleeding, constipation or diarrhea.   Patient is otherwise without complaints or active issues today.   Past Medical History: Past Medical History:  Diagnosis Date   A-fib (HCC)    Allergy    Anxiety    Asthma    Cataract    Diabetes mellitus without complication (HCC)    Gestational Diabetes   Dysrhythmia    Endometriosis 1996   GERD (gastroesophageal reflux disease)    H/O osteopenia    History of kidney stones    HLD (hyperlipidemia)    Hx of varicella    Osteopenia    Palpitations    PONV (postoperative nausea and vomiting)    Prediabetes    Sleep apnea    Transfusion history    placental abruption 1986   Tremor      Past Surgical History: Past Surgical History:  Procedure Laterality Date   ABDOMINAL HYSTERECTOMY     ABDOMINAL SURGERY     for congenital kidney abnormality   CARPAL TUNNEL RELEASE     CESAREAN SECTION  8013,8009   CYSTOSCOPY WITH RETROGRADE PYELOGRAM, URETEROSCOPY AND STENT PLACEMENT Right 09/15/2023   Procedure: CYSTOSCOPY WITH BILATERAL RETROGRADE PYELOGRAM,RIGHT  URETEROSCOPY,LASER LITHOTRIPSY AND STENT PLACEMENT;  Surgeon: Roseann Adine PARAS., MD;  Location: [facility];  Service: Urology;  Laterality: Right;   EXTRACORPOREAL SHOCK WAVE LITHOTRIPSY Left 11/09/2023   Procedure: EXTRACORPOREAL SHOCK WAVE LITHOTRIPSY (ESWL);  Surgeon: Roseann Adine PARAS., MD;  Location: [facility];  Service: Urology;  Laterality: Left;   EXTRACORPOREAL SHOCK WAVE LITHOTRIPSY Left 02/22/2024   Procedure: LITHOTRIPSY, ESWL;  Surgeon: Roseann Adine PARAS., MD;  Location: [facility];  Service: Urology;   Laterality: Left;   LAPAROSCOPIC SALPINGO OOPHERECTOMY     LITHOTRIPSY  1992-1996   multiple   NOSE SURGERY     surgical fusion disk 5& 6     TONSILLECTOMY      Allergies: Allergies  Allergen Reactions   Beta Adrenergic Blockers Other (See Comments)    Fatigue,, Rash   Niacin Nausea And Vomiting and Rash    Other Reaction(s): GI Intolerance   Iohexol  Hives    Other Reaction(s): Not available   Ioxaglate Hives   Ivp Dye [Iodinated Contrast Media] Hives    Had a reaction to IV contrast in the late 1980s.   We discussed that the contrast used today is much better tolerated.   She should be able to tolerate a CT with contrast or cath if she needs.   I would give her pre medication ( steroids, benadryl , pepcid.    Penicillin G Rash    Other reaction(s): Unknown   Bystolic  [Nebivolol  Hcl] Rash    Rash    Metoprolol  Rash and Other (See Comments)    Fatigue, soreactic rash    Outpatient Meds: Current Outpatient Medications  Medication Sig Dispense Refill   Cholecalciferol (VITAMIN D3) 25 MCG (1000 UT) CAPS Take 1,000 Units by mouth 3 (three) times a week.     cyanocobalamin (VITAMIN B12) 1000 MCG tablet Take 1,000 mcg by mouth 3 (three) times a week.     MAGNESIUM PO Take 5 mLs by mouth 3 (three) times  a week. magnesium carbonate 175 mg     metFORMIN  (GLUCOPHAGE -XR) 500 MG 24 hr tablet TAKE 1 TABLET(500 MG) BY MOUTH DAILY 90 tablet 3   Multiple Vitamin (MULTIVITAMIN) tablet Take 1 tablet by mouth daily.     polyethylene glycol powder (GLYCOLAX/MIRALAX) 17 GM/SCOOP powder Take 17 g by mouth 2 (two) times a week.     ferrous sulfate 324 MG TBEC Take 324 mg by mouth every 14 (fourteen) days.     levalbuterol  (XOPENEX  HFA) 45 MCG/ACT inhaler Inhale 2 puffs every 6 hours as needed- rescue 15 g 3   Current Facility-Administered Medications  Medication Dose Route Frequency Provider Last Rate Last Admin   0.9 %  sodium chloride  infusion  500 mL Intravenous Once Danis, Sheana Bir L III, MD           ___________________________________________________________________ Objective   Exam:  BP (!) 94/56   Pulse 68   Temp 98.2 F (36.8 C)   Ht 5' 3 (1.6 m)   Wt 190 lb (86.2 kg)   SpO2 98%   BMI 33.66 kg/m   CV: regular , S1/S2 Resp: clear to auscultation bilaterally, normal RR and effort noted GI: soft, no tenderness, with active bowel sounds.   Assessment: Encounter Diagnosis  Name Primary?   Family history of malignant neoplasm of gastrointestinal tract Yes     Plan: Colonoscopy   The benefits and risks of the planned procedure(s) were described in detail with the patient or (when appropriate) their health care proxy.  Risks were outlined as including, but not limited to, bleeding, infection, perforation, adverse medication reaction leading to cardiac or pulmonary decompensation, pancreatitis (if ERCP).  The limitation of incomplete mucosal visualization was also discussed.  No guarantees or warranties were given.  The patient was provided an opportunity to ask questions and all were answered. The patient agreed with the plan.   The patient is appropriate for an endoscopic procedure in the ambulatory setting.   - Victory Brand, MD

## 2024-09-27 NOTE — Op Note (Signed)
 [facility] Patient Name: Meredith Finley Procedure Date: 09/27/2024 9:58 AM MRN: [id number] Endoscopist: Shove L. Legrand , MD, [id number] Age: 63 Referring MD:  Date of Birth: 1961-05-30 Gender: Female Account #: [id number] Procedure:                Colonoscopy Indications:              Colon cancer screening in patient at increased                            risk: Colorectal cancer in mother                           no polyps Aug 2019 Medicines:                Monitored Anesthesia Care Procedure:                Pre-Anesthesia Assessment:                           - Prior to the procedure, a History and Physical                            was performed, and patient medications and                            allergies were reviewed. The patient's tolerance of                            previous anesthesia was also reviewed. The risks                            and benefits of the procedure and the sedation                            options and risks were discussed with the patient.                            All questions were answered, and informed consent                            was obtained. Prior Anticoagulants: The patient has                            taken no anticoagulant or antiplatelet agents. ASA                            Grade Assessment: II - A patient with mild systemic                            disease. After reviewing the risks and benefits,                            the patient was deemed in satisfactory condition to  undergo the procedure.                           After obtaining informed consent, the colonoscope                            was passed under direct vision. Throughout the                            procedure, the patient's blood pressure, pulse, and                            oxygen saturations were monitored continuously. The                            CF [id number] #[id number] was introduced through the anus                             and advanced to the the cecum, identified by                            appendiceal orifice and ileocecal valve. The                            colonoscopy was performed without difficulty. The                            patient tolerated the procedure well. The quality                            of the bowel preparation was excellent. The                            ileocecal valve, appendiceal orifice, and rectum                            were photographed. The bowel preparation used was                            SUPREP. Scope In: 10:16:18 AM Scope Out: 10:29:24 AM Scope Withdrawal Time: 0 hours 9 minutes 53 seconds  Total Procedure Duration: 0 hours 13 minutes 6 seconds  Findings:                 The perianal and digital rectal examinations were                            normal.                           Repeat examination of right colon under NBI                            performed.  Internal hemorrhoids were found.                           There is no endoscopic evidence of polyps in the                            entire colon.                           The exam was otherwise without abnormality on                            direct and retroflexion views. Complications:            No immediate complications. Estimated Blood Loss:     Estimated blood loss: none. Impression:               - Internal hemorrhoids.                           - The examination was otherwise normal on direct                            and retroflexion views.                           - No specimens collected. Recommendation:           - Patient has a contact number available for                            emergencies. The signs and symptoms of potential                            delayed complications were discussed with the                            patient. Return to normal activities tomorrow.                            Written discharge instructions were  provided to the                            patient.                           - Resume previous diet.                           - Continue present medications.                           - Repeat colonoscopy in 5 years for screening                            purposes. (Fam Hx CRC) Faron Tudisco L. Legrand, MD 09/27/2024 10:33:59 AM This report has been signed electronically.

## 2024-09-27 NOTE — Patient Instructions (Signed)
 Handouts given: Hemorrhoids Resume previous diet. Continue present medications.  Repeat colonoscopy in 5 years for screening purposes.  YOU HAD AN ENDOSCOPIC PROCEDURE TODAY AT THE [facility]:   Refer to the procedure report that was given to you for any specific questions about what was found during the examination.  If the procedure report does not answer your questions, please call your gastroenterologist to clarify.  If you requested that your care partner not be given the details of your procedure findings, then the procedure report has been included in a sealed envelope for you to review at your convenience later.  YOU SHOULD EXPECT: Some feelings of bloating in the abdomen. Passage of more gas than usual.  Walking can help get rid of the air that was put into your GI tract during the procedure and reduce the bloating. If you had a lower endoscopy (such as a colonoscopy or flexible sigmoidoscopy) you may notice spotting of blood in your stool or on the toilet paper. If you underwent a bowel prep for your procedure, you may not have a normal bowel movement for a few days.  Please Note:  You might notice some irritation and congestion in your nose or some drainage.  This is from the oxygen used during your procedure.  There is no need for concern and it should clear up in a day or so.  SYMPTOMS TO REPORT IMMEDIATELY:  Following lower endoscopy (colonoscopy or flexible sigmoidoscopy):  Excessive amounts of blood in the stool  Significant tenderness or worsening of abdominal pains  Swelling of the abdomen that is new, acute  Fever of 100F or higher  For urgent or emergent issues, a gastroenterologist can be reached at any hour by calling [phone]. Do not use MyChart messaging for urgent concerns.    DIET:  We do recommend a small meal at first, but then you may proceed to your regular diet.  Drink plenty of fluids but you should avoid alcoholic beverages for 24  hours.  ACTIVITY:  You should plan to take it easy for the rest of today and you should NOT DRIVE or use heavy machinery until tomorrow (because of the sedation medicines used during the test).    FOLLOW UP: Our staff will call the number listed on your records the next business day following your procedure.  We will call around 7:15- 8:00 am to check on you and address any questions or concerns that you may have regarding the information given to you following your procedure. If we do not reach you, we will leave a message.     If any biopsies were taken you will be contacted by phone or by letter within the next 1-3 weeks.  Please call us  at [phone] if you have not heard about the biopsies in 3 weeks.    SIGNATURES/CONFIDENTIALITY: You and/or your care partner have signed paperwork which will be entered into your electronic medical record.  These signatures attest to the fact that that the information above on your After Visit Summary has been reviewed and is understood.  Full responsibility of the confidentiality of this discharge information lies with you and/or your care-partner.

## 2024-09-27 NOTE — Progress Notes (Signed)
 To pacu, VSS. Report to [email or web address]

## 2024-09-28 ENCOUNTER — Telehealth: Payer: Self-pay

## 2024-09-28 NOTE — Telephone Encounter (Signed)
  Follow up Call-     09/27/2024    9:50 AM  Call back number  Post procedure Call Back phone  # [phone]  Permission to leave phone message Yes     Patient questions:  Do you have a fever, pain , or abdominal swelling? No. Pain Score  0 *  Have you tolerated food without any problems? Yes  Have you been able to return to your normal activities? Yes.    Do you have any questions about your discharge instructions: Diet   No. Medications  No. Follow up visit  No.  Do you have questions or concerns about your Care? No.  Actions: * If pain score is 4 or above: No action needed, pain <4.

## 2024-10-20 ENCOUNTER — Ambulatory Visit: Admitting: Urology

## 2025-01-19 ENCOUNTER — Ambulatory Visit: Admitting: Urology

## 2025-04-24 ENCOUNTER — Ambulatory Visit: Admitting: Dermatology
